# Patient Record
Sex: Male | Born: 1950 | Race: Black or African American | Hispanic: No | Marital: Married | Smoking: Former smoker
Health system: Southern US, Community
[De-identification: ages and names within clinical notes are randomized; demographics above are authoritative.]

## PROBLEM LIST (undated history)

## (undated) DIAGNOSIS — M1990 Unspecified osteoarthritis, unspecified site: Secondary | ICD-10-CM

## (undated) DIAGNOSIS — J329 Chronic sinusitis, unspecified: Secondary | ICD-10-CM

## (undated) DIAGNOSIS — R040 Epistaxis: Secondary | ICD-10-CM

## (undated) DIAGNOSIS — K219 Gastro-esophageal reflux disease without esophagitis: Secondary | ICD-10-CM

## (undated) DIAGNOSIS — N200 Calculus of kidney: Secondary | ICD-10-CM

## (undated) DIAGNOSIS — C61 Malignant neoplasm of prostate: Secondary | ICD-10-CM

## (undated) HISTORY — DX: Chronic sinusitis, unspecified: J32.9

## (undated) HISTORY — DX: Calculus of kidney: N20.0

## (undated) HISTORY — DX: Malignant neoplasm of prostate: C61

## (undated) HISTORY — DX: Epistaxis: R04.0

## (undated) HISTORY — DX: Gastro-esophageal reflux disease without esophagitis: K21.9

## (undated) HISTORY — DX: Unspecified osteoarthritis, unspecified site: M19.90

---

## 1998-01-01 ENCOUNTER — Other Ambulatory Visit: Admission: RE | Admit: 1998-01-01 | Discharge: 1998-01-01 | Payer: Self-pay | Admitting: *Deleted

## 2003-01-14 ENCOUNTER — Ambulatory Visit (HOSPITAL_COMMUNITY): Admission: RE | Admit: 2003-01-14 | Discharge: 2003-01-14 | Payer: Self-pay | Admitting: Internal Medicine

## 2003-05-07 ENCOUNTER — Encounter: Payer: Self-pay | Admitting: Gastroenterology

## 2005-06-29 ENCOUNTER — Encounter: Payer: Self-pay | Admitting: Gastroenterology

## 2005-07-02 ENCOUNTER — Encounter: Payer: Self-pay | Admitting: Gastroenterology

## 2006-04-04 ENCOUNTER — Encounter: Payer: Self-pay | Admitting: Gastroenterology

## 2006-05-24 HISTORY — PX: LITHOTRIPSY: SUR834

## 2006-06-16 ENCOUNTER — Ambulatory Visit (HOSPITAL_COMMUNITY): Admission: RE | Admit: 2006-06-16 | Discharge: 2006-06-16 | Payer: Self-pay | Admitting: Urology

## 2008-09-04 ENCOUNTER — Encounter: Admission: RE | Admit: 2008-09-04 | Discharge: 2008-09-04 | Payer: Self-pay | Admitting: Internal Medicine

## 2008-10-30 ENCOUNTER — Ambulatory Visit: Payer: Self-pay | Admitting: Gastroenterology

## 2008-10-30 DIAGNOSIS — R12 Heartburn: Secondary | ICD-10-CM | POA: Insufficient documentation

## 2008-10-30 DIAGNOSIS — Z860100 Personal history of colon polyps, unspecified: Secondary | ICD-10-CM | POA: Insufficient documentation

## 2008-12-27 ENCOUNTER — Encounter: Admission: RE | Admit: 2008-12-27 | Discharge: 2008-12-27 | Payer: Self-pay | Admitting: Family Medicine

## 2009-01-11 ENCOUNTER — Encounter: Admission: RE | Admit: 2009-01-11 | Discharge: 2009-01-11 | Payer: Self-pay | Admitting: Family Medicine

## 2009-03-18 ENCOUNTER — Ambulatory Visit: Admission: RE | Admit: 2009-03-18 | Discharge: 2009-05-23 | Payer: Self-pay | Admitting: Radiation Oncology

## 2009-05-28 ENCOUNTER — Ambulatory Visit
Admission: RE | Admit: 2009-05-28 | Discharge: 2009-07-24 | Payer: Self-pay | Source: Home / Self Care | Admitting: Radiation Oncology

## 2010-04-21 ENCOUNTER — Ambulatory Visit: Payer: Self-pay | Admitting: Family Medicine

## 2010-04-29 ENCOUNTER — Emergency Department (HOSPITAL_COMMUNITY)
Admission: EM | Admit: 2010-04-29 | Discharge: 2010-04-29 | Payer: Self-pay | Source: Home / Self Care | Admitting: Emergency Medicine

## 2010-05-04 ENCOUNTER — Encounter
Admission: RE | Admit: 2010-05-04 | Discharge: 2010-05-04 | Payer: Self-pay | Source: Home / Self Care | Attending: Otolaryngology | Admitting: Otolaryngology

## 2010-06-23 NOTE — Procedures (Signed)
Summary: Colonoscopy/[facility]  [facility]   Imported By: Sherian Rein 11/01/2008 10:40:30  _____________________________________________________________________  External Attachment:    Type:   Image     Comment:   External Document

## 2010-06-23 NOTE — Procedures (Signed)
Summary: EGD/[facility]  EGD/[facility]   Imported By: Sherian Rein 11/01/2008 10:43:40  _____________________________________________________________________  External Attachment:    Type:   Image     Comment:   External Document

## 2010-06-25 ENCOUNTER — Ambulatory Visit (INDEPENDENT_AMBULATORY_CARE_PROVIDER_SITE_OTHER): Payer: Managed Care, Other (non HMO) | Admitting: Physician Assistant

## 2010-06-25 DIAGNOSIS — J069 Acute upper respiratory infection, unspecified: Secondary | ICD-10-CM

## 2010-07-16 ENCOUNTER — Encounter (INDEPENDENT_AMBULATORY_CARE_PROVIDER_SITE_OTHER): Payer: Self-pay | Admitting: *Deleted

## 2010-07-21 ENCOUNTER — Ambulatory Visit: Payer: Managed Care, Other (non HMO) | Admitting: Gastroenterology

## 2010-07-21 NOTE — Letter (Signed)
Summary: New Patient letter  [facility]  [address] [place], [place] [address]   Phone: [phone]  Fax: [phone]       07/16/2010 MRN: [id number]  William Hicks [address] [place], [place]  [address]  Dear Mr. Heffern,  Welcome to the Gastroenterology Division at [facility].    You are scheduled to see Dr.  Arlyce Dice on 07-21-10 at 1:30P.M. on the 3rd floor at [facility], [address]  We ask that you try to arrive at our office 15 minutes prior to your appointment time to allow for check-in.  We would like you to complete the enclosed self-administered evaluation form prior to your visit and bring it with you on the day of your appointment.  We will review it with you.  Also, please bring a complete list of all your medications or, if you prefer, bring the medication bottles and we will list them.  Please bring your insurance card so that we may make a copy of it.  If your insurance requires a referral to see a specialist, please bring your referral form from your primary care physician.  Co-payments are due at the time of your visit and may be paid by cash, check or credit card.     Your office visit will consist of a consult with your physician (includes a physical exam), any laboratory testing he/she may order, scheduling of any necessary diagnostic testing (e.g. x-ray, ultrasound, CT-scan), and scheduling of a procedure (e.g. Endoscopy, Colonoscopy) if required.  Please allow enough time on your schedule to allow for any/all of these possibilities.    If you cannot keep your appointment, please call [phone] to cancel or reschedule prior to your appointment date.  This allows [place] the opportunity to schedule an appointment for another patient in need of care.  If you do not cancel or reschedule by 5 p.m. the business day prior to your appointment date, you will be charged a $50.00 late cancellation/no-show fee.    Thank you for choosing  [facility] for your medical needs.  We appreciate the opportunity to care for you.  Please visit [place] at our website  to learn more about our practice.                     Sincerely,                                                             The Gastroenterology Division

## 2010-08-04 LAB — CBC
HCT: 42.8 % (ref 39.0–52.0)
Hemoglobin: 14.9 g/dL (ref 13.0–17.0)
MCH: 26 pg (ref 26.0–34.0)
MCHC: 34.8 g/dL (ref 30.0–36.0)
MCV: 74.7 fL — ABNORMAL LOW (ref 78.0–100.0)
Platelets: 138 10*3/uL — ABNORMAL LOW (ref 150–400)
RBC: 5.73 MIL/uL (ref 4.22–5.81)
RDW: 13.2 % (ref 11.5–15.5)
WBC: 7 10*3/uL (ref 4.0–10.5)

## 2010-08-04 LAB — DIFFERENTIAL
Basophils Absolute: 0 10*3/uL (ref 0.0–0.1)
Basophils Relative: 0 % (ref 0–1)
Eosinophils Absolute: 0.1 10*3/uL (ref 0.0–0.7)
Eosinophils Relative: 2 % (ref 0–5)
Lymphocytes Relative: 12 % (ref 12–46)
Lymphs Abs: 0.8 10*3/uL (ref 0.7–4.0)
Monocytes Absolute: 0.7 10*3/uL (ref 0.1–1.0)
Monocytes Relative: 10 % (ref 3–12)
Neutro Abs: 5.4 10*3/uL (ref 1.7–7.7)
Neutrophils Relative %: 76 % (ref 43–77)

## 2010-08-04 LAB — BASIC METABOLIC PANEL
BUN: 14 mg/dL (ref 6–23)
CO2: 32 mEq/L (ref 19–32)
Calcium: 8.9 mg/dL (ref 8.4–10.5)
Chloride: 101 mEq/L (ref 96–112)
Creatinine, Ser: 1.02 mg/dL (ref 0.4–1.5)
GFR calc Af Amer: >60 mL/min (ref >60)
GFR calc non Af Amer: >60 mL/min (ref >60)
Glucose, Bld: 88 mg/dL (ref 70–99)
Potassium: 3.5 mEq/L (ref 3.5–5.1)
Sodium: 139 mEq/L (ref 135–145)

## 2010-08-13 ENCOUNTER — Ambulatory Visit (INDEPENDENT_AMBULATORY_CARE_PROVIDER_SITE_OTHER): Payer: Managed Care, Other (non HMO) | Admitting: Family Medicine

## 2010-08-13 DIAGNOSIS — J029 Acute pharyngitis, unspecified: Secondary | ICD-10-CM

## 2010-08-19 ENCOUNTER — Encounter (INDEPENDENT_AMBULATORY_CARE_PROVIDER_SITE_OTHER): Payer: Managed Care, Other (non HMO) | Admitting: Family Medicine

## 2010-08-19 DIAGNOSIS — Z79899 Other long term (current) drug therapy: Secondary | ICD-10-CM

## 2010-08-19 DIAGNOSIS — Z0000 Encounter for general adult medical examination without abnormal findings: Secondary | ICD-10-CM

## 2010-08-19 DIAGNOSIS — F528 Other sexual dysfunction not due to a substance or known physiological condition: Secondary | ICD-10-CM

## 2010-08-19 DIAGNOSIS — K219 Gastro-esophageal reflux disease without esophagitis: Secondary | ICD-10-CM

## 2010-12-18 ENCOUNTER — Encounter (INDEPENDENT_AMBULATORY_CARE_PROVIDER_SITE_OTHER): Payer: Self-pay | Admitting: General Surgery

## 2010-12-18 ENCOUNTER — Other Ambulatory Visit (INDEPENDENT_AMBULATORY_CARE_PROVIDER_SITE_OTHER): Payer: Self-pay | Admitting: General Surgery

## 2010-12-18 ENCOUNTER — Ambulatory Visit (INDEPENDENT_AMBULATORY_CARE_PROVIDER_SITE_OTHER): Payer: Private Health Insurance - Indemnity | Admitting: General Surgery

## 2010-12-18 DIAGNOSIS — L03317 Cellulitis of buttock: Secondary | ICD-10-CM

## 2010-12-18 DIAGNOSIS — K611 Rectal abscess: Secondary | ICD-10-CM

## 2010-12-18 DIAGNOSIS — K612 Anorectal abscess: Secondary | ICD-10-CM

## 2010-12-18 DIAGNOSIS — L0231 Cutaneous abscess of buttock: Secondary | ICD-10-CM

## 2010-12-18 MED ORDER — DOXYCYCLINE HYCLATE 100 MG PO TABS: 100.0000 mg | ORAL_TABLET | Freq: Two times a day (BID) | ORAL | Status: AC

## 2010-12-18 MED ORDER — TRAMADOL HCL 50 MG PO TABS
50.0000 mg | ORAL_TABLET | Freq: Four times a day (QID) | ORAL | Status: AC | PRN
Start: 2010-12-18 — End: 2011-12-18

## 2010-12-18 NOTE — Progress Notes (Signed)
William Hicks is a 60 y.o. male.    Chief Complaint  Patient presents with  . Other    evaluate peri rectal abscess    HPI HPI The patient initially had a problem with what was called a perirectal abscess about 2 weeks ago. He was initially treated with p.o. antibiotics Augmentin with some relief he stopped his antibiotics unless there is starting this morning he developed more tenderness and pain in the same area where he has had tenderness before he also developed on the left side a similar abscess near the superior portion of the intergluteal fold.  The patient was asked to come and see surgery after this recurrence of the "perirectal abscess" in  Past Medical History  Diagnosis Date  . Asthma   . GERD (gastroesophageal reflux disease)   . Sinusitis   . Epistaxis   . Arthritis   . Prostate cancer   . Kidney stones     Past Surgical History  Procedure Date  . Lithotripsy 2008    Family History  Problem Relation Age of Onset  . Alzheimer's disease Father     Social History History  Substance Use Topics  . Smoking status: Former Games developer  . Smokeless tobacco: Not on file  . Alcohol Use: Yes    No Known Allergies  Current Outpatient Prescriptions  Medication Sig Dispense Refill  . fluticasone (VERAMYST) 27.5 MCG/SPRAY nasal spray Place 2 sprays into the nose daily.        . Loratadine (CLARITIN PO) Take by mouth as needed.        Marland Kitchen LORAZEPAM PO Take by mouth daily.        Marland Kitchen amoxicillin-clavulanate (AUGMENTIN) 875-125 MG per tablet       . DEXILANT 60 MG capsule         Review of Systems Review of Systems  Constitutional: Negative for fever and chills.    Physical Exam Physical Exam  Genitourinary: Rectum normal. Rectal exam shows no internal hemorrhoid and no mass.        There were no vitals taken for this visit.  Assessment/Plan Technically this is not a perirectal abscess but aphakic folliculitis and/or a boil of the gluteus maximus. He had  a similar lesion on the left upper intergluteal fold that has resolved without incision and drainage.  The plan today would be to anesthetize patient and drainage under local anesthetic.  With a Betadine prep of the anesthetize the area to make a cruciate incision directly over the boil on the right gluteal cheek. It was minimal purulent drainage cultures were sent. I was able to pedicle with about a foot and a half a quarter-inch iodoform gauze which were removed menisci the patient on Tuesday.  Ranette Luckadoo III,Sabrea Sankey O 12/18/2010, 4:19 PM

## 2010-12-22 ENCOUNTER — Encounter (INDEPENDENT_AMBULATORY_CARE_PROVIDER_SITE_OTHER): Payer: Self-pay | Admitting: General Surgery

## 2010-12-22 ENCOUNTER — Ambulatory Visit (INDEPENDENT_AMBULATORY_CARE_PROVIDER_SITE_OTHER): Payer: Private Health Insurance - Indemnity | Admitting: General Surgery

## 2010-12-22 DIAGNOSIS — L03317 Cellulitis of buttock: Secondary | ICD-10-CM

## 2010-12-22 DIAGNOSIS — L0231 Cutaneous abscess of buttock: Secondary | ICD-10-CM

## 2010-12-22 LAB — WOUND CULTURE
Gram Stain: NONE SEEN
Gram Stain: NONE SEEN

## 2010-12-22 NOTE — Progress Notes (Signed)
HPI The patient is status post incision and drainage of right gluteal abscess. Cultures have demonstrated Staphylococcus aureus. The patient was placed on doxycycline at the last office visit.  PE The patient continues to have a significant amount of induration around the opening of the drained abscess on his right gluteal cheek. The packing was removed in the office today. There appears to be an empty cavity with no pus the indurated area is tender but there is no redness there is no drainage.  Studiy review There are no studies today. However, I am considering getting some type of imaging study if the induration does not resolve over time or if his symptoms worsen.  Assessment Resolving gluteal abscess.  Plan Continue his antibiotics until they are finished. Start sitz baths at least twice a day to go see him back in 2 weeks.

## 2011-01-05 ENCOUNTER — Encounter (INDEPENDENT_AMBULATORY_CARE_PROVIDER_SITE_OTHER): Payer: Private Health Insurance - Indemnity | Admitting: General Surgery

## 2011-08-17 ENCOUNTER — Ambulatory Visit (INDEPENDENT_AMBULATORY_CARE_PROVIDER_SITE_OTHER): Payer: Managed Care, Other (non HMO) | Admitting: Family Medicine

## 2011-08-17 ENCOUNTER — Encounter: Payer: Self-pay | Admitting: Family Medicine

## 2011-08-17 VITALS — BP 130/90 | HR 104 | Ht 73.0 in | Wt 214.0 lb

## 2011-08-17 DIAGNOSIS — K219 Gastro-esophageal reflux disease without esophagitis: Secondary | ICD-10-CM | POA: Insufficient documentation

## 2011-08-17 NOTE — Progress Notes (Signed)
  Subjective:    Patient ID: William Hicks, male    DOB: 1950-09-29, 60 y.o.   MRN: [id number]  HPI He is here for consultation concerning a ten-day history of mid chest burning sensation that is worse when he lies down. He cannot relate this to food of any kind. He's had no nausea, vomiting, eructation, change in stool. Presently he is on a Dexilant and Levsin for treatment of underlying GERD and spasm. Chest pain, shortness of breath, diaphoresis   Review of Systems     Objective:   Physical Exam Alert and in no distress. Cardiac exam shows regular rhythm without murmurs or gallops. Lungs are clear to auscultation. Abdominal exam shows no masses or tenderness with normal bowel sounds       Assessment & Plan:  GERD. Refer back to GI. It seems as if he is having a breakthrough of reflux symptoms in spite of Dexilant.

## 2011-08-18 ENCOUNTER — Other Ambulatory Visit: Payer: Self-pay | Admitting: Urology

## 2011-08-18 DIAGNOSIS — C61 Malignant neoplasm of prostate: Secondary | ICD-10-CM

## 2011-08-31 ENCOUNTER — Other Ambulatory Visit (HOSPITAL_COMMUNITY): Payer: Managed Care, Other (non HMO)

## 2011-09-08 ENCOUNTER — Other Ambulatory Visit: Payer: Self-pay | Admitting: Urology

## 2011-09-08 DIAGNOSIS — C61 Malignant neoplasm of prostate: Secondary | ICD-10-CM

## 2011-09-14 ENCOUNTER — Other Ambulatory Visit (HOSPITAL_COMMUNITY): Payer: Managed Care, Other (non HMO)

## 2011-09-20 ENCOUNTER — Other Ambulatory Visit (HOSPITAL_COMMUNITY): Payer: Managed Care, Other (non HMO)

## 2011-09-24 ENCOUNTER — Encounter (HOSPITAL_COMMUNITY)
Admission: RE | Admit: 2011-09-24 | Discharge: 2011-09-24 | Disposition: A | Discharge status: Home / Self Care | Payer: Managed Care, Other (non HMO) | Source: Ambulatory Visit | Attending: Urology | Admitting: Urology

## 2011-09-24 DIAGNOSIS — N281 Cyst of kidney, acquired: Secondary | ICD-10-CM | POA: Insufficient documentation

## 2011-09-24 DIAGNOSIS — C61 Malignant neoplasm of prostate: Secondary | ICD-10-CM | POA: Insufficient documentation

## 2011-09-24 MED ORDER — FLUDEOXYGLUCOSE F - 18 (FDG) INJECTION
18.5000 | Freq: Once | INTRAVENOUS | Status: AC | PRN
  Administered 2011-09-24: 18.5 via INTRAVENOUS

## 2011-10-08 ENCOUNTER — Other Ambulatory Visit: Payer: Self-pay | Admitting: Pediatrics

## 2011-10-08 ENCOUNTER — Ambulatory Visit
Admission: RE | Admit: 2011-10-08 | Discharge: 2011-10-08 | Disposition: A | Discharge status: Home / Self Care | Payer: 59 | Source: Ambulatory Visit | Attending: Pediatrics | Admitting: Pediatrics

## 2011-10-08 DIAGNOSIS — R05 Cough: Secondary | ICD-10-CM

## 2011-10-08 DIAGNOSIS — R059 Cough, unspecified: Secondary | ICD-10-CM

## 2011-10-12 ENCOUNTER — Other Ambulatory Visit: Payer: Self-pay | Admitting: Urology

## 2011-10-12 DIAGNOSIS — C61 Malignant neoplasm of prostate: Secondary | ICD-10-CM

## 2011-11-01 ENCOUNTER — Ambulatory Visit
Admission: RE | Admit: 2011-11-01 | Discharge: 2011-11-01 | Disposition: A | Discharge status: Home / Self Care | Payer: 59 | Source: Ambulatory Visit | Attending: Urology | Admitting: Urology

## 2011-11-01 DIAGNOSIS — C61 Malignant neoplasm of prostate: Secondary | ICD-10-CM

## 2011-11-12 ENCOUNTER — Other Ambulatory Visit: Payer: 59

## 2011-11-23 ENCOUNTER — Institutional Professional Consult (permissible substitution): Payer: 59 | Admitting: Pulmonary Disease

## 2011-12-02 ENCOUNTER — Other Ambulatory Visit: Payer: Self-pay | Admitting: Family Medicine

## 2011-12-03 NOTE — Telephone Encounter (Signed)
Is this ok?

## 2011-12-03 NOTE — Telephone Encounter (Signed)
Levitra refilled.

## 2011-12-10 ENCOUNTER — Encounter: Payer: Self-pay | Admitting: Urology

## 2012-01-10 ENCOUNTER — Other Ambulatory Visit: Payer: Self-pay | Admitting: Family Medicine

## 2012-01-10 NOTE — Telephone Encounter (Signed)
Is this ok?

## 2012-01-19 ENCOUNTER — Institutional Professional Consult (permissible substitution): Payer: 59 | Admitting: Pulmonary Disease

## 2012-04-18 ENCOUNTER — Other Ambulatory Visit: Payer: Self-pay

## 2012-09-25 DIAGNOSIS — E785 Hyperlipidemia, unspecified: Secondary | ICD-10-CM | POA: Insufficient documentation

## 2013-11-14 DIAGNOSIS — M169 Osteoarthritis of hip, unspecified: Secondary | ICD-10-CM | POA: Insufficient documentation

## 2015-05-06 ENCOUNTER — Encounter: Payer: Self-pay | Admitting: Physician Assistant

## 2015-05-06 ENCOUNTER — Ambulatory Visit: Payer: Self-pay | Admitting: Physician Assistant

## 2015-05-06 VITALS — BP 130/80 | HR 83 | Temp 97.9°F

## 2015-05-06 DIAGNOSIS — J069 Acute upper respiratory infection, unspecified: Secondary | ICD-10-CM

## 2015-05-06 DIAGNOSIS — C61 Malignant neoplasm of prostate: Secondary | ICD-10-CM | POA: Insufficient documentation

## 2015-05-06 MED ORDER — CEFDINIR 300 MG PO CAPS: 300.0000 mg | ORAL_CAPSULE | Freq: Two times a day (BID) | ORAL | Status: DC

## 2015-05-06 NOTE — Progress Notes (Signed)
S: C/o runny nose and congestion for 3 days, no fever, chills, cp/sob, v/d; mucus is green and thick, cough is sporadic, c/o of facial and dental pain.   Using otc meds:   O: PE: vitals wnl perrl eomi, normocephalic, tms dull, nasal mucosa red and swollen, throat injected, neck supple no lymph, lungs c t a, cv rrr, neuro intact  A:  Acute sinusitis   P: omnicef, drink fluids, continue regular meds , use otc meds of choice, return if not improving in 5 days, return earlier if worsening

## 2015-05-09 ENCOUNTER — Telehealth: Payer: Self-pay | Admitting: Physician Assistant

## 2015-05-09 MED ORDER — CEPHALEXIN 500 MG PO CAPS: 500.0000 mg | ORAL_CAPSULE | Freq: Three times a day (TID) | ORAL | Status: DC

## 2015-05-09 NOTE — Telephone Encounter (Signed)
William Hicks, can you call in keflex 500mg  tid for 10days, #30 nr

## 2015-05-09 NOTE — Telephone Encounter (Signed)
Spoke with [employer] @ [facility] in [place] called in [place]. Attempted to call patient no ans. Unable to leave message.

## 2015-07-18 ENCOUNTER — Ambulatory Visit: Payer: Self-pay | Admitting: Physician Assistant

## 2015-07-18 ENCOUNTER — Encounter: Payer: Self-pay | Admitting: Physician Assistant

## 2015-07-18 VITALS — BP 130/80 | HR 84 | Temp 98.5°F

## 2015-07-18 DIAGNOSIS — J069 Acute upper respiratory infection, unspecified: Secondary | ICD-10-CM

## 2015-07-18 MED ORDER — PSEUDOEPH-BROMPHEN-DM 30-2-10 MG/5ML PO SYRP: 5.0000 mL | ORAL_SOLUTION | Freq: Four times a day (QID) | ORAL | Status: DC | PRN

## 2015-07-18 MED ORDER — FEXOFENADINE-PSEUDOEPHED ER 60-120 MG PO TB12: 1.0000 | ORAL_TABLET | Freq: Two times a day (BID) | ORAL | Status: DC

## 2015-07-18 NOTE — Progress Notes (Signed)
   Subjective:Nasal/chest congestion    Patient ID: William Hicks, male    DOB: 1950/10/30, 65 y.o.   MRN: [id number]  HPI Patient complain of nasal and chest congestion for 5 days. Patient has history of allergic rhinitis and uses Flonase. States thick yellowish productive cough.  Denies fever/chill, or nausea, vomiting., or diarrhea. No other palliative measures for this compliant.    Review of Systems    GRED and Erectile dysfunction Objective:   Physical Exam Bilateral maxillary guarding with edematous nasal turbinates. Post nasal drainage and productive cough.  Neck supple without adenopathy.  Lungs CTA and Heart RRR.       Assessment & Plan:URI  Allergran- D and Bromfed DM as directed.  Follow up PRN.

## 2015-07-22 ENCOUNTER — Telehealth: Payer: Self-pay | Admitting: Family

## 2015-07-25 ENCOUNTER — Ambulatory Visit: Payer: Self-pay | Admitting: Physician Assistant

## 2015-10-31 NOTE — Telephone Encounter (Signed)
Per Colleen Can continue Allegra D.

## 2016-10-25 ENCOUNTER — Ambulatory Visit: Payer: Self-pay | Admitting: Physician Assistant

## 2016-10-25 ENCOUNTER — Ambulatory Visit
Admission: RE | Admit: 2016-10-25 | Discharge: 2016-10-25 | Disposition: A | Discharge status: Home / Self Care | Payer: Self-pay | Source: Ambulatory Visit | Attending: Physician Assistant | Admitting: Physician Assistant

## 2016-10-25 ENCOUNTER — Encounter: Payer: Self-pay | Admitting: Physician Assistant

## 2016-10-25 VITALS — BP 130/88 | HR 67 | Temp 98.4°F

## 2016-10-25 DIAGNOSIS — I879 Disorder of vein, unspecified: Secondary | ICD-10-CM | POA: Insufficient documentation

## 2016-10-25 NOTE — Progress Notes (Signed)
S: c/o bulging long "knot" on r side of chest/abdomen, no pain in abdomen, no cp/sob, noticed the area on Saturday and it has gotten a little worse, no fever/chills/injury  O: vitals wnl, nad, skin intact no bruising or increased warmth noted, + long distended hard flat vein from area near nipple to below r rib, lungs c t a, cv rrr  A: vein d/o  P: [place] chest to evaluate vein for clot

## 2016-10-27 NOTE — Progress Notes (Signed)
Contacted patient to get an update on him. Inquired if he had reached out to a [facility] provider since he has [employer] ins. If we can be  Any assistant

## 2016-12-28 ENCOUNTER — Encounter: Payer: Self-pay | Admitting: Allergy and Immunology

## 2016-12-28 ENCOUNTER — Ambulatory Visit (INDEPENDENT_AMBULATORY_CARE_PROVIDER_SITE_OTHER): Payer: Non-veteran care | Admitting: Allergy and Immunology

## 2016-12-28 VITALS — BP 126/70 | HR 92 | Resp 16 | Ht 72.0 in | Wt 218.8 lb

## 2016-12-28 DIAGNOSIS — J3089 Other allergic rhinitis: Secondary | ICD-10-CM | POA: Diagnosis not present

## 2016-12-28 DIAGNOSIS — J4520 Mild intermittent asthma, uncomplicated: Secondary | ICD-10-CM

## 2016-12-28 DIAGNOSIS — J324 Chronic pansinusitis: Secondary | ICD-10-CM | POA: Diagnosis not present

## 2016-12-28 MED ORDER — METHYLPREDNISOLONE ACETATE 80 MG/ML IJ SUSP
80.0000 mg | Freq: Once | INTRAMUSCULAR | Status: AC
  Administered 2016-12-28: 80 mg via INTRAMUSCULAR

## 2016-12-28 MED ORDER — MONTELUKAST SODIUM 10 MG PO TABS: 10.0000 mg | ORAL_TABLET | Freq: Every day | ORAL | 2 refills | Status: DC

## 2016-12-28 NOTE — Patient Instructions (Addendum)
  1.  Allergen avoidance measures  2. Treat and prevent inflammation:   A. OTC Nasacort - one spray each nostril one time per day  B. montelukast 10 mg - one tablet one time per day  C. Depo-Medrol 80 IM delivered in clinic today  3. If needed:   A. cetirizine 10 mg tablet 1 time per day  B. nasal saline multiple times a day  C. ProAir HFA 2 puffs every 4-6 hours  4. Review results of chest x-ray and sinus CT scan from [place]  5. Consider a course of immunotherapy  6. Blood - CBC w/diff, IgA/G/M  7. Return to clinic in 4 weeks or earlier if problem

## 2016-12-28 NOTE — Progress Notes (Signed)
Dear Dr. Damita Dunnings,  Thank you for referring William Hicks to the [facility] on 12/28/2016.   Below is a summation of this patient's evaluation and recommendations.  Thank you for your referral. I will keep you informed about this patient's response to treatment.   If you have any questions please do not hesitate to contact me.   Sincerely,  Jiles Prows, MD Allergy / Immunology [place]   ______________________________________________________________________    NEW PATIENT NOTE  Referring Provider: Theodoro Clock, MD Primary Provider: Linus Mako, NP Date of office visit: 12/28/2016    Subjective:   Chief Complaint:  William Hicks (DOB: 17-Aug-1950) is a 66 y.o. male who presents to the clinic on 12/28/2016 with a chief complaint of New Patient (Initial Visit) .     HPI: William Hicks presents to this clinic in evaluation of 2 main issues.  First, he has a long history of nasal congestion and sneezing that may be precipitated by grass exposure but basically occurs on a perennial basis. He has intermittent episodes of "sinusitis" and has been treated with antibiotics and systemic steroids with a frequency of approximately 3 times per year. He was given Flonase for about 2 years which did not really help him. He was seen by an ENT doctor who performed a CT scan of the sinuses which was "abnormal". Presently over the course of the past 2 months he has had some intermittent ugly nasal discharge and complete anosmia.  Second, he has a history of asthma. This apparently flares when his head is congested and has also been present all life. His requirement for short acting bronchodilator is about 1 time every 2 weeks. He will use a short acting bronchodilator prior to mowing the grass. He been given a controller inhaler but he does not use his inhaler. He also believes that he  receives systemic steroids about 3 times per year for this issue, but usually this is in a situation when his upper airway disease is flaring as well.  The results of his sinus CT scan is not available for review during today's evaluation. He apparently had a chest x-ray in 2017 which also is not available for review during today's evaluation. Apparently his chest x-ray was normal.  He did smoke tobacco but only for approximately 4 - 5 years and discontinued this hobby at the age of 49.  He does have reflux disease which is under excellent control on his current dose of omeprazole. He has been given ranitidine to use as well but does not require this medication.  Past Medical History:  Diagnosis Date  . Arthritis   . Asthma   . Epistaxis   . GERD (gastroesophageal reflux disease)   . Kidney stones   . Prostate cancer ([place])   . Sinusitis     Past Surgical History:  Procedure Laterality Date  . LITHOTRIPSY  2008    Allergies as of 12/28/2016      Reactions   Aspirin Diarrhea, Nausea And Vomiting      Medication List      fluticasone 50 MCG/ACT nasal spray Commonly known as:  FLONASE Place into the nose.   LEVITRA 20 MG tablet Generic drug:  vardenafil TAKE ONE TABLET BY MOUTH DAILY AS NEEDED   omeprazole 40 MG capsule Commonly known as:  PRILOSEC Take 40 mg by mouth daily.   PROAIR HFA 108 (90  Base) MCG/ACT inhaler Generic drug:  albuterol Inhale into the lungs.       Review of systems negative except as noted in HPI / PMHx or noted below:  Review of Systems  Constitutional: Negative.   HENT: Negative.   Eyes: Negative.   Respiratory: Negative.   Cardiovascular: Negative.   Gastrointestinal: Negative.   Genitourinary: Negative.   Musculoskeletal: Negative.   Skin: Negative.   Neurological: Negative.   Endo/Heme/Allergies: Negative.   Psychiatric/Behavioral: Negative.     Family History  Problem Relation Age of Onset  . Alzheimer's disease Father      Social History   Social History  . Marital status: Married    Spouse name: N/A  . Number of children: N/A  . Years of education: N/A   Occupational History  . Not on file.   Social History Main Topics  . Smoking status: Former Research scientist (life sciences)  . Smokeless tobacco: Never Used  . Alcohol use Yes  . Drug use: No  . Sexual activity: Not on file   Other Topics Concern  . Not on file   Social History Narrative  . No narrative on file    Environmental and Social history  Lives in a house with a dry environment, no animals located inside the household, no carpeting in the bedroom, no plastic on the bed, plastic on the pillow, and no smoker located inside the household. He works as a med Building services engineer:   Vitals:   12/28/16 1828  BP: 126/70  Pulse: 92  Resp: 16   Height: 6' (182.9 cm) Weight: 218 lb 12.8 oz (99.2 kg)  Physical Exam  Constitutional: He is well-developed, well-nourished, and in no distress.  Nasal voice, nasal crease  HENT:  Head: Normocephalic. Head is without right periorbital erythema and without left periorbital erythema.  Right Ear: Tympanic membrane, external ear and ear canal normal.  Left Ear: Tympanic membrane, external ear and ear canal normal.  Nose: Mucosal edema present. No rhinorrhea.  Mouth/Throat: Oropharynx is clear and moist and mucous membranes are normal. No oropharyngeal exudate.  Eyes: Pupils are equal, round, and reactive to light. Conjunctivae and lids are normal.  Neck: Trachea normal. No tracheal deviation present. No thyromegaly present.  Cardiovascular: Normal rate, regular rhythm, S1 normal, S2 normal and normal heart sounds.   No murmur heard. Pulmonary/Chest: Effort normal. No stridor. No tachypnea. No respiratory distress. He has no wheezes. He has no rales. He exhibits no tenderness.  Abdominal: Soft. He exhibits no distension and no mass. There is no hepatosplenomegaly. There is no tenderness. There is no rebound and no  guarding.  Musculoskeletal: He exhibits no edema or tenderness.  Lymphadenopathy:       Head (right side): No tonsillar adenopathy present.       Head (left side): No tonsillar adenopathy present.    He has no cervical adenopathy.    He has no axillary adenopathy.  Neurological: He is alert. Gait normal.  Skin: No rash noted. He is not diaphoretic. No erythema. No pallor. Nails show no clubbing.  Psychiatric: Mood and affect normal.    Diagnostics: Allergy skin tests were performed. He demonstrated hypersensitivity to house dust mite, cat, grass, and ragweed.  Spirometry was performed and demonstrated an FEV1 of 3.19 @ 98 % of predicted.  Results of a chest CT scan obtained 11/01/2011 identify the following:  Pre tracheal lymph node measures 0.9 cm, image 15. This is unchanged from prior exam.    Persistent ground-glass lesion  within the superior segment of the left lower lobe measuring 3.2 cm.  Previously this measured 2.2 cm.  No new pulmonary densities identified.  Results of a head CT scan obtained 04/29/2010 identify the following:  1.  Chronic ethmoid and sphenoid sinusitis.   Otherwise, no significant abnormality identified.   Assessment and Plan:    1. Other allergic rhinitis   2. Chronic pansinusitis   3. Asthma, mild intermittent, well-controlled     1.  Allergen avoidance measures  2. Treat and prevent inflammation:   A. OTC Nasacort - one spray each nostril one time per day  B. montelukast 10 mg - one tablet one time per day  C. Depo-Medrol 80 IM delivered in clinic today  3. If needed:   A. cetirizine 10 mg tablet 1 time per day  B. nasal saline multiple times a day  C. ProAir HFA 2 puffs every 4-6 hours  4. Review results of chest x-ray and sinus CT scan from [place]  5. Consider a course of immunotherapy  6. Blood - CBC w/diff, IgA/G/M  7. Return to clinic in 4 weeks or earlier if problem  Jamill appears to have atopic respiratory disease and we  will get him to perform allergen avoidance measures and consistently using anti-inflammatory medications as noted above. I have given him literature on immunotherapy during today's visit and he is presently considering this option. He would be a candidate for immunotherapy given the fact that he has failed medical therapy in the past. He does have an abnormal CT scan finding back in 2013 and this needs to be followed up and apparently he did have a chest x-ray recently which was normal. As well, I would like to review the results of his sinus CT scan that was performed at the [place] and we will obtain the results of that study for review. Finally, I would like to see if he has eosinophilic driven disease and if his antibody levels are adequate in preventing him from developing recurrent infections. I will regroup with him in 4 weeks or earlier if there is a problem.  Jiles Prows, MD Allergy / Immunology [facility]

## 2016-12-29 LAB — CBC WITH DIFFERENTIAL/PLATELET
Basophils Absolute: 0 10*3/uL (ref 0.0–0.2)
Basos: 0 %
EOS (ABSOLUTE): 0.2 10*3/uL (ref 0.0–0.4)
Eos: 4 %
Hematocrit: 39.3 % (ref 37.5–51.0)
Hemoglobin: 12.9 g/dL — ABNORMAL LOW (ref 13.0–17.7)
Immature Grans (Abs): 0 10*3/uL (ref 0.0–0.1)
Immature Granulocytes: 0 %
Lymphocytes Absolute: 0.9 10*3/uL (ref 0.7–3.1)
Lymphs: 16 %
MCH: 24.4 pg — ABNORMAL LOW (ref 26.6–33.0)
MCHC: 32.8 g/dL (ref 31.5–35.7)
MCV: 74 fL — ABNORMAL LOW (ref 79–97)
Monocytes Absolute: 0.4 10*3/uL (ref 0.1–0.9)
Monocytes: 7 %
Neutrophils Absolute: 4 10*3/uL (ref 1.4–7.0)
Neutrophils: 73 %
Platelets: 171 10*3/uL (ref 150–379)
RBC: 5.28 x10E6/uL (ref 4.14–5.80)
RDW: 14.4 % (ref 12.3–15.4)
WBC: 5.5 10*3/uL (ref 3.4–10.8)

## 2016-12-29 LAB — IGG, IGA, IGM
IgA/Immunoglobulin A, Serum: 17 mg/dL — ABNORMAL LOW (ref 61–437)
IgG (Immunoglobin G), Serum: 1116 mg/dL (ref 700–1600)
IgM (Immunoglobulin M), Srm: 23 mg/dL (ref 20–172)

## 2016-12-30 NOTE — Addendum Note (Signed)
Addended by: Clydene Laming, AMBER L on: 12/30/2016 02:21 PM   Modules accepted: Orders

## 2017-01-25 ENCOUNTER — Encounter: Payer: Self-pay | Admitting: Allergy and Immunology

## 2017-01-25 ENCOUNTER — Ambulatory Visit (INDEPENDENT_AMBULATORY_CARE_PROVIDER_SITE_OTHER): Payer: Non-veteran care | Admitting: Allergy and Immunology

## 2017-01-25 VITALS — BP 120/88 | HR 64 | Resp 22

## 2017-01-25 DIAGNOSIS — J4520 Mild intermittent asthma, uncomplicated: Secondary | ICD-10-CM | POA: Diagnosis not present

## 2017-01-25 DIAGNOSIS — J324 Chronic pansinusitis: Secondary | ICD-10-CM

## 2017-01-25 DIAGNOSIS — J3089 Other allergic rhinitis: Secondary | ICD-10-CM | POA: Diagnosis not present

## 2017-01-25 NOTE — Progress Notes (Signed)
Follow-up Note  Referring Provider: Linus Mako, NP Primary Provider: Linus Mako, NP Date of Office Visit: 01/25/2017  Subjective:   William Hicks (DOB: 1950-09-16) is a 66 y.o. male who returns to the [facility] [place] on 01/25/2017 in re-evaluation of the following:  HPI: Nakhi returns to this clinic in reevaluation of his persistent upper airway symptoms addressed during his initial evaluation of 12/28/2016.  He is better. He has had a decrease in his nasal congestion and postnasal drip and his sneezing but he still has the symptoms active in the face of his therapy. He had return of his ability to smell after receiving a systemic steroid injection during last visit but that has slowly returned. He did performed house dust avoidance measures and is using a nasal steroid and a leukotriene modifier. He has had no need to use any ProAir HFA as he has had no wheezing or coughing or shortness of breath or exercise intolerance.  Allergies as of 01/25/2017      Reactions   Aspirin Diarrhea, Nausea And Vomiting      Medication List      fluticasone 50 MCG/ACT nasal spray Commonly known as:  FLONASE Place into the nose.   montelukast 10 MG tablet Commonly known as:  SINGULAIR Take 1 tablet (10 mg total) by mouth at bedtime.   omeprazole 40 MG capsule Commonly known as:  PRILOSEC Take 40 mg by mouth daily.   PROAIR HFA 108 (90 Base) MCG/ACT inhaler Generic drug:  albuterol Inhale into the lungs.       Past Medical History:  Diagnosis Date  . Arthritis   . Asthma   . Epistaxis   . GERD (gastroesophageal reflux disease)   . Kidney stones   . Prostate cancer ([place])   . Sinusitis     Past Surgical History:  Procedure Laterality Date  . LITHOTRIPSY  2008    Review of systems negative except as noted in HPI / PMHx or noted below:  Review of Systems  Constitutional: Negative.   HENT: Negative.   Eyes: Negative.   Respiratory:  Negative.   Cardiovascular: Negative.   Gastrointestinal: Negative.   Genitourinary: Negative.   Musculoskeletal: Negative.   Skin: Negative.   Neurological: Negative.   Endo/Heme/Allergies: Negative.   Psychiatric/Behavioral: Negative.      Objective:   Vitals:   01/25/17 1613  BP: 120/88  Pulse: 64  Resp: (!) 22          Physical Exam  Constitutional: He is well-developed, well-nourished, and in no distress.  HENT:  Head: Normocephalic.  Right Ear: Tympanic membrane, external ear and ear canal normal.  Left Ear: Tympanic membrane, external ear and ear canal normal.  Nose: Nose normal. No mucosal edema or rhinorrhea.  Mouth/Throat: Uvula is midline, oropharynx is clear and moist and mucous membranes are normal. No oropharyngeal exudate.  Eyes: Conjunctivae are normal.  Neck: Trachea normal. No tracheal tenderness present. No tracheal deviation present. No thyromegaly present.  Cardiovascular: Normal rate, regular rhythm, S1 normal, S2 normal and normal heart sounds.   No murmur heard. Pulmonary/Chest: Breath sounds normal. No stridor. No respiratory distress. He has no wheezes. He has no rales.  Musculoskeletal: He exhibits no edema.  Lymphadenopathy:       Head (right side): No tonsillar adenopathy present.       Head (left side): No tonsillar adenopathy present.    He has no cervical adenopathy.  Neurological: He is alert.  Gait normal.  Skin: No rash noted. He is not diaphoretic. No erythema. Nails show no clubbing.  Psychiatric: Mood and affect normal.    Diagnostics: Results of blood tests obtained 12/28/2016 identified IgG 1116, IgA 17, IgM 23 MG/DL. White blood cell count 5.5 with 200 eosinophils, 900 lymphocytes, hemoglobin 12.9, platelet 171.  Assessment and Plan:   1. Other allergic rhinitis   2. Chronic pansinusitis   3. Asthma, mild intermittent, well-controlled     1.  Continue to perform Allergen avoidance measures  2. Continue to Treat and  prevent inflammation:   A. OTC Nasacort - one spray each nostril one time per day  B. montelukast 10 mg - one tablet one time per day  3. If needed:   A. cetirizine 10 mg tablet 1 time per day  B. nasal saline multiple times a day  C. ProAir HFA 2 puffs every 4-6 hours  4. Start a course of immunotherapy  6. Obtain fall flu vaccine   7. Return to clinic in December 2018 or earlier if problem  I think that Linley has received maximal improvement with medical therapy and this is all he is going to get with a combination of allergen avoidance measures and the consistent use of anti-inflammatory medications and he is definitely a candidate for immunotherapy and it does sound as though he is going to initiate this form of treatment sometime in the near future. I will see him back in this clinic in December 2018 or earlier if there is a problem.  Allena Katz, MD Allergy / Immunology [place]

## 2017-01-25 NOTE — Patient Instructions (Addendum)
  1.  Continue to perform Allergen avoidance measures  2. Continue to Treat and prevent inflammation:   A. OTC Nasacort - one spray each nostril one time per day  B. montelukast 10 mg - one tablet one time per day  3. If needed:   A. cetirizine 10 mg tablet 1 time per day  B. nasal saline multiple times a day  C. ProAir HFA 2 puffs every 4-6 hours  4. Start a course of immunotherapy  6. Obtain fall flu vaccine   7. Return to clinic in December 2018 or earlier if problem

## 2017-02-10 DIAGNOSIS — J3089 Other allergic rhinitis: Secondary | ICD-10-CM | POA: Diagnosis not present

## 2017-02-11 ENCOUNTER — Encounter: Payer: Self-pay | Admitting: *Deleted

## 2017-02-11 ENCOUNTER — Other Ambulatory Visit: Payer: Self-pay | Admitting: Allergy and Immunology

## 2017-02-11 DIAGNOSIS — J3089 Other allergic rhinitis: Secondary | ICD-10-CM

## 2017-02-11 DIAGNOSIS — J301 Allergic rhinitis due to pollen: Secondary | ICD-10-CM | POA: Diagnosis not present

## 2017-02-11 NOTE — Progress Notes (Signed)
4 VIAL SET MADE. EXP: 02-11-18. HC

## 2017-02-16 ENCOUNTER — Ambulatory Visit (INDEPENDENT_AMBULATORY_CARE_PROVIDER_SITE_OTHER): Payer: Non-veteran care

## 2017-02-16 DIAGNOSIS — J309 Allergic rhinitis, unspecified: Secondary | ICD-10-CM

## 2017-02-16 NOTE — Progress Notes (Signed)
Immunotherapy   Patient Details  Name: NICOLUS OSE MRN: [id number] Date of Birth: 1950-10-13  02/16/2017  [name] started injections for  D-Mites,Weed,Grass,Cat Following schedule: B  Frequency:1-2 times per week Epi-Pen:Prescription for Epi-Pen given Consent signed and patient instructions given.   Jonette Eva 02/16/2017, 4:02 PM

## 2017-02-18 ENCOUNTER — Ambulatory Visit (INDEPENDENT_AMBULATORY_CARE_PROVIDER_SITE_OTHER): Payer: Non-veteran care

## 2017-02-18 DIAGNOSIS — J309 Allergic rhinitis, unspecified: Secondary | ICD-10-CM | POA: Diagnosis not present

## 2017-02-21 ENCOUNTER — Ambulatory Visit (INDEPENDENT_AMBULATORY_CARE_PROVIDER_SITE_OTHER): Payer: Non-veteran care | Admitting: *Deleted

## 2017-02-21 DIAGNOSIS — J309 Allergic rhinitis, unspecified: Secondary | ICD-10-CM | POA: Diagnosis not present

## 2017-02-25 ENCOUNTER — Ambulatory Visit: Payer: Self-pay

## 2017-02-28 ENCOUNTER — Ambulatory Visit (INDEPENDENT_AMBULATORY_CARE_PROVIDER_SITE_OTHER): Payer: Non-veteran care | Admitting: *Deleted

## 2017-02-28 DIAGNOSIS — J309 Allergic rhinitis, unspecified: Secondary | ICD-10-CM | POA: Diagnosis not present

## 2017-03-02 ENCOUNTER — Ambulatory Visit (INDEPENDENT_AMBULATORY_CARE_PROVIDER_SITE_OTHER): Payer: Non-veteran care | Admitting: *Deleted

## 2017-03-02 DIAGNOSIS — J309 Allergic rhinitis, unspecified: Secondary | ICD-10-CM | POA: Diagnosis not present

## 2017-03-08 ENCOUNTER — Ambulatory Visit (INDEPENDENT_AMBULATORY_CARE_PROVIDER_SITE_OTHER): Payer: Non-veteran care | Admitting: *Deleted

## 2017-03-08 DIAGNOSIS — J309 Allergic rhinitis, unspecified: Secondary | ICD-10-CM

## 2017-03-11 ENCOUNTER — Ambulatory Visit (INDEPENDENT_AMBULATORY_CARE_PROVIDER_SITE_OTHER): Payer: Non-veteran care

## 2017-03-11 DIAGNOSIS — J309 Allergic rhinitis, unspecified: Secondary | ICD-10-CM

## 2017-03-14 ENCOUNTER — Ambulatory Visit (INDEPENDENT_AMBULATORY_CARE_PROVIDER_SITE_OTHER): Payer: Non-veteran care | Admitting: Allergy & Immunology

## 2017-03-14 ENCOUNTER — Encounter: Payer: Self-pay | Admitting: Allergy & Immunology

## 2017-03-14 VITALS — BP 114/70 | HR 86 | Temp 98.9°F | Resp 18

## 2017-03-14 DIAGNOSIS — J4530 Mild persistent asthma, uncomplicated: Secondary | ICD-10-CM | POA: Diagnosis not present

## 2017-03-14 DIAGNOSIS — J302 Other seasonal allergic rhinitis: Secondary | ICD-10-CM | POA: Diagnosis not present

## 2017-03-14 DIAGNOSIS — J0100 Acute maxillary sinusitis, unspecified: Secondary | ICD-10-CM

## 2017-03-14 DIAGNOSIS — J3089 Other allergic rhinitis: Secondary | ICD-10-CM | POA: Diagnosis not present

## 2017-03-14 NOTE — Patient Instructions (Addendum)
1. Acute non-recurrent maxillary sinusitis - With your current symptoms and time course, antibiotics are not needed.  - If symptoms are not improving in 1-2 days, feel free to call us or email me at [email or web address] and we can send in an antibiotic at that time.  - Add on nasal saline spray (i.e., Simply Saline) or nasal saline lavage (i.e., NeilMed) as needed prior to medicated nasal sprays. - For thick post nasal drainage, add guaifenesin [phone] mg (Mucinex)  twice daily as needed with adequate hydration as discussed. - Start the prednisone pack provided today.   2. Mild persistent asthma, uncomplicated - Lung testing was slightly low today, but it did improve with the nebulizer treatment. - I anticipate that you are experiencing an asthma exacerbation secondary to a viral sinus infection. - Daily controller medication(s): Singulair 10mg  once daily - Prior to physical activity: ProAir 2 puffs 10-15 minutes before physical activity. - Rescue medications: ProAir 4 puffs every 4-6 hours as needed - Asthma control goals:  * Full participation in all desired activities (may need albuterol before activity) * Albuterol use two time or less a week on average (not counting use with activity) * Cough interfering with sleep two time or less a month * Oral steroids no more than once a year * No hospitalizations  3. Seasonal and perennial allergic rhinitis - Continue with allergy shots at the same schedule. - If you come tomorrow or Wednesday, you could still get a second injection on Friday this week. - However, I do not think it would be a good idea to get a shot this week.   4. Return in about 3 months (around 06/14/2017).   Please inform us of any Emergency Department visits, hospitalizations, or changes in symptoms. Call us before going to the ED for breathing or allergy symptoms since we might be able to fit you in for a sick visit. Feel free to contact us anytime with any  questions, problems, or concerns.  It was a pleasure to meet you today! Enjoy the fall season!  Websites that have reliable patient information: 1. American Academy of Asthma, Allergy, and Immunology: www.aaaai.org 2. Food Allergy Research and Education (FARE): foodallergy.org 3. Mothers of Asthmatics: http://www.asthmacommunitynetwork.org 4. American College of Allergy, Asthma, and Immunology: www.acaai.org   Election Day is coming up on Tuesday, November 6th! Although it is too late to register to vote by mail, you can still register up to November 5th at any of the early voting locations. Try to early vote in case there are problems with your registration!   If you are turned away at the polls, you have the right to request a provisional ballot, which is required by law!      [place]- [place] (open 8am - 5pm) First Floor [place], [place] (open [place]) [place], [place] (open 7am - 7pm)  [place], [employer] (open 7am - 7pm) [address], [employer] (open 7am - 7pm) [address] [place], [employer] (open 7am - 7pm) [place], [place] (open [place]) [place], [place] (open 7am - 7pm) [place], [employer] (open 7am - 7pm) [place], [place]

## 2017-03-14 NOTE — Progress Notes (Signed)
FOLLOW UP  Date of Service/Encounter:  03/14/17   Assessment:   Acute non-recurrent maxillary sinusitis  Mild persistent asthma, uncomplicated  Seasonal and perennial allergic rhinitis - on immunotherapy   Plan/Recommendations:   1. Acute non-recurrent maxillary sinusitis - With your current symptoms and time course, antibiotics are not needed.  - If symptoms are not improving in 1-2 days, feel free to call us or email me at [email or web address] and we can send in an antibiotic at that time.  - Add on nasal saline spray (i.e., Simply Saline) or nasal saline lavage (i.e., NeilMed) as needed prior to medicated nasal sprays. - For thick post nasal drainage, add guaifenesin [phone] mg (Mucinex)  twice daily as needed with adequate hydration as discussed. - Start the prednisone pack provided today.   2. Mild persistent asthma, uncomplicated - Lung testing was slightly low today, but it did improve with the nebulizer treatment. - I anticipate that you are experiencing an asthma exacerbation secondary to a viral sinus infection. - Daily controller medication(s): Singulair 10mg  once daily - Prior to physical activity: ProAir 2 puffs 10-15 minutes before physical activity. - Rescue medications: ProAir 4 puffs every 4-6 hours as needed - Asthma control goals:  * Full participation in all desired activities (may need albuterol before activity) * Albuterol use two time or less a week on average (not counting use with activity) * Cough interfering with sleep two time or less a month * Oral steroids no more than once a year * No hospitalizations  3. Seasonal and perennial allergic rhinitis - Continue with allergy shots at the same schedule. - If you come tomorrow or Wednesday, you could still get a second injection on Friday this week. - However, I do not think it would be a good idea to get a shot this week.   4. Return in about 3 months (around 06/14/2017).   Subjective:     William Hicks is a 66 y.o. male presenting today for follow up of  Chief Complaint  Patient presents with  . Cough    productive cough with yellow sputum, chills, some body aching, possible fever, sore throat. symptoms ongoing since Friday.     William Hicks has a history of the following: Patient Active Problem List   Diagnosis Date Noted  . Malignant neoplasm of prostate ([place]) 05/06/2015  . Degenerative arthritis of hip 11/14/2013  . HLD (hyperlipidemia) 09/25/2012  . GERD (gastroesophageal reflux disease) 08/17/2011  . Abscess, gluteal, right 12/22/2010  . Abscess, gluteal 12/18/2010  . HEARTBURN 10/30/2008  . PERSONAL HISTORY OF COLONIC POLYPS 10/30/2008    History obtained from: chart review and patient.  [name]'s Primary Care Provider is Raji, Sharlynn Oliphant, NP.     William Hicks is a 66 y.o. male presenting for a sick visit. He was last seen in September 2018 by Dr. Neldon Mc. At that time, he was doing better from a respiratory perspective. He was continued on Nasacort as well as Singulair with cetirizine and ProAir as needed. He was continued on his immunotherapy regimen. He had recent labs for a history of recurrent infections that was normal.   Since the last visit, he has not done well. He endorses 4-5 days of coughing and sinus pressure with postnasal drip. He has been using Robitussin-DM with minimal relief as well as ibuprofen for the headaches. He is not using nasal saline rinses on a routine basis. He remains on his Singulair, and has used his albuterol rescue inhaler  2-3 times daily since symptoms began. He has remained afebrile.He works as a Quarry manager at a nursing home, and therefore has been around some sick individuals.  He remains on his allergy shots, and has been tolerating these well. He was scheduled to receive an injection today. Otherwise, there have been no changes to his past medical history, surgical history, family history, or social  history.    Review of Systems: a 14-point review of systems is pertinent for what is mentioned in HPI.  Otherwise, all other systems were negative. Constitutional: negative other than that listed in the HPI Eyes: negative other than that listed in the HPI Ears, nose, mouth, throat, and face: negative other than that listed in the HPI Respiratory: negative other than that listed in the HPI Cardiovascular: negative other than that listed in the HPI Gastrointestinal: negative other than that listed in the HPI Genitourinary: negative other than that listed in the HPI Integument: negative other than that listed in the HPI Hematologic: negative other than that listed in the HPI Musculoskeletal: negative other than that listed in the HPI Neurological: negative other than that listed in the HPI Allergy/Immunologic: negative other than that listed in the HPI    Objective:   Blood pressure 114/70, pulse 86, temperature 98.9 F (37.2 C), temperature source Oral, resp. rate 18, SpO2 99 %. There is no height or weight on file to calculate BMI.   Physical Exam:  General: Alert, interactive, in no acute distress. Pleasant male. Eyes: No conjunctival injection bilaterally, no discharge on the right, no discharge on the left and no Horner-Trantas dots present. PERRL bilaterally. EOMI without pain. No photophobia.  Ears: Right TM pearly gray with normal light reflex, Left TM pearly gray with normal light reflex, Right TM intact without perforation and Left TM intact without perforation.  Nose/Throat: External nose within normal limits and septum midline. Turbinates edematous and pale with clear discharge. Posterior oropharynx erythematous without cobblestoning in the posterior oropharynx. Tonsils 2+ without exudates.  Tongue without thrush. Adenopathy: shoddy bilateral anterior cervical lymphadenopathy Lungs: Clear to auscultation without wheezing, rhonchi or rales. No increased work of  breathing. CV: Normal S1/S2. No murmurs. Capillary refill <2 seconds.  Skin: Warm and dry, without lesions or rashes. Neuro:   Grossly intact. No focal deficits appreciated. Responsive to questions.  Diagnostic studies:   Spirometry: results abnormal (FEV1: 3.33/147%, FVC: 4.07/138%, FEV1/FVC: 82%).    Elevated FVC is indicative of air trapping, which is suggestive of obstructive disease. Albuterol/Atrovent nebulizer treatment given in clinic with improvement in FEV1 and FVC, but not significant per ATS criteria. FVL looked much more normal following the treatment.    Allergy Studies: none    Salvatore Marvel, MD [facility]

## 2017-03-22 ENCOUNTER — Ambulatory Visit (INDEPENDENT_AMBULATORY_CARE_PROVIDER_SITE_OTHER): Payer: Non-veteran care | Admitting: *Deleted

## 2017-03-22 DIAGNOSIS — J309 Allergic rhinitis, unspecified: Secondary | ICD-10-CM | POA: Diagnosis not present

## 2017-03-25 ENCOUNTER — Ambulatory Visit (INDEPENDENT_AMBULATORY_CARE_PROVIDER_SITE_OTHER): Payer: Non-veteran care

## 2017-03-25 DIAGNOSIS — J309 Allergic rhinitis, unspecified: Secondary | ICD-10-CM | POA: Diagnosis not present

## 2017-03-30 ENCOUNTER — Ambulatory Visit (INDEPENDENT_AMBULATORY_CARE_PROVIDER_SITE_OTHER): Payer: Non-veteran care

## 2017-03-30 DIAGNOSIS — J309 Allergic rhinitis, unspecified: Secondary | ICD-10-CM | POA: Diagnosis not present

## 2017-04-04 ENCOUNTER — Ambulatory Visit (INDEPENDENT_AMBULATORY_CARE_PROVIDER_SITE_OTHER): Payer: Non-veteran care | Admitting: *Deleted

## 2017-04-04 DIAGNOSIS — J309 Allergic rhinitis, unspecified: Secondary | ICD-10-CM | POA: Diagnosis not present

## 2017-04-08 ENCOUNTER — Ambulatory Visit (INDEPENDENT_AMBULATORY_CARE_PROVIDER_SITE_OTHER): Payer: Non-veteran care

## 2017-04-08 DIAGNOSIS — J309 Allergic rhinitis, unspecified: Secondary | ICD-10-CM

## 2017-04-13 ENCOUNTER — Ambulatory Visit (INDEPENDENT_AMBULATORY_CARE_PROVIDER_SITE_OTHER): Payer: Non-veteran care

## 2017-04-13 DIAGNOSIS — J309 Allergic rhinitis, unspecified: Secondary | ICD-10-CM | POA: Diagnosis not present

## 2017-04-18 ENCOUNTER — Ambulatory Visit (INDEPENDENT_AMBULATORY_CARE_PROVIDER_SITE_OTHER): Payer: Non-veteran care

## 2017-04-18 DIAGNOSIS — J309 Allergic rhinitis, unspecified: Secondary | ICD-10-CM | POA: Diagnosis not present

## 2017-04-22 ENCOUNTER — Ambulatory Visit (INDEPENDENT_AMBULATORY_CARE_PROVIDER_SITE_OTHER): Payer: Non-veteran care

## 2017-04-22 DIAGNOSIS — J309 Allergic rhinitis, unspecified: Secondary | ICD-10-CM | POA: Diagnosis not present

## 2017-04-26 ENCOUNTER — Ambulatory Visit: Payer: Non-veteran care | Admitting: Allergy and Immunology

## 2017-04-28 ENCOUNTER — Ambulatory Visit (INDEPENDENT_AMBULATORY_CARE_PROVIDER_SITE_OTHER): Payer: Non-veteran care | Admitting: *Deleted

## 2017-04-28 DIAGNOSIS — J309 Allergic rhinitis, unspecified: Secondary | ICD-10-CM

## 2017-05-03 ENCOUNTER — Ambulatory Visit (INDEPENDENT_AMBULATORY_CARE_PROVIDER_SITE_OTHER): Payer: Non-veteran care | Admitting: *Deleted

## 2017-05-03 DIAGNOSIS — J309 Allergic rhinitis, unspecified: Secondary | ICD-10-CM

## 2017-05-06 ENCOUNTER — Ambulatory Visit (INDEPENDENT_AMBULATORY_CARE_PROVIDER_SITE_OTHER): Payer: Non-veteran care

## 2017-05-06 DIAGNOSIS — J309 Allergic rhinitis, unspecified: Secondary | ICD-10-CM

## 2017-05-11 ENCOUNTER — Ambulatory Visit (INDEPENDENT_AMBULATORY_CARE_PROVIDER_SITE_OTHER): Payer: Non-veteran care

## 2017-05-11 DIAGNOSIS — J309 Allergic rhinitis, unspecified: Secondary | ICD-10-CM | POA: Diagnosis not present

## 2017-05-13 ENCOUNTER — Telehealth: Payer: Self-pay

## 2017-05-13 NOTE — Telephone Encounter (Signed)
Authorization request faxed to [facility] contact center. [phone]. Patients referral exp 05/23/2017.

## 2017-05-18 ENCOUNTER — Ambulatory Visit (INDEPENDENT_AMBULATORY_CARE_PROVIDER_SITE_OTHER): Payer: Non-veteran care

## 2017-05-18 DIAGNOSIS — J309 Allergic rhinitis, unspecified: Secondary | ICD-10-CM | POA: Diagnosis not present

## 2017-05-25 ENCOUNTER — Ambulatory Visit (INDEPENDENT_AMBULATORY_CARE_PROVIDER_SITE_OTHER): Payer: Self-pay | Admitting: *Deleted

## 2017-05-25 DIAGNOSIS — J309 Allergic rhinitis, unspecified: Secondary | ICD-10-CM

## 2017-05-31 ENCOUNTER — Ambulatory Visit (INDEPENDENT_AMBULATORY_CARE_PROVIDER_SITE_OTHER): Payer: Non-veteran care | Admitting: *Deleted

## 2017-05-31 DIAGNOSIS — J309 Allergic rhinitis, unspecified: Secondary | ICD-10-CM

## 2017-06-01 NOTE — Telephone Encounter (Signed)
Contacted patient to let him know I haven't heard anything from the [place]. Patient will give them a call today.

## 2017-06-03 NOTE — Telephone Encounter (Signed)
Patient will now be Self Pay due to the [place] denying a new authorization until he goes back to see his ENT. Patient will notify us once he gets his authorization back.

## 2018-03-29 DIAGNOSIS — Z87891 Personal history of nicotine dependence: Secondary | ICD-10-CM | POA: Diagnosis not present

## 2018-03-29 DIAGNOSIS — J342 Deviated nasal septum: Secondary | ICD-10-CM | POA: Diagnosis not present

## 2018-03-29 DIAGNOSIS — Z7289 Other problems related to lifestyle: Secondary | ICD-10-CM | POA: Diagnosis not present

## 2018-03-29 DIAGNOSIS — J324 Chronic pansinusitis: Secondary | ICD-10-CM | POA: Diagnosis not present

## 2018-03-29 DIAGNOSIS — J3089 Other allergic rhinitis: Secondary | ICD-10-CM | POA: Diagnosis not present

## 2018-05-01 DIAGNOSIS — J329 Chronic sinusitis, unspecified: Secondary | ICD-10-CM | POA: Diagnosis not present

## 2018-05-01 DIAGNOSIS — J324 Chronic pansinusitis: Secondary | ICD-10-CM | POA: Diagnosis not present

## 2018-05-01 DIAGNOSIS — J3489 Other specified disorders of nose and nasal sinuses: Secondary | ICD-10-CM | POA: Diagnosis not present

## 2018-05-23 DIAGNOSIS — J338 Other polyp of sinus: Secondary | ICD-10-CM | POA: Diagnosis not present

## 2018-05-23 DIAGNOSIS — J322 Chronic ethmoidal sinusitis: Secondary | ICD-10-CM | POA: Diagnosis not present

## 2018-05-23 DIAGNOSIS — J323 Chronic sphenoidal sinusitis: Secondary | ICD-10-CM | POA: Diagnosis not present

## 2018-05-23 DIAGNOSIS — J320 Chronic maxillary sinusitis: Secondary | ICD-10-CM | POA: Diagnosis not present

## 2018-05-23 DIAGNOSIS — J321 Chronic frontal sinusitis: Secondary | ICD-10-CM | POA: Diagnosis not present

## 2018-05-23 DIAGNOSIS — J329 Chronic sinusitis, unspecified: Secondary | ICD-10-CM | POA: Diagnosis not present

## 2018-05-23 DIAGNOSIS — J324 Chronic pansinusitis: Secondary | ICD-10-CM | POA: Diagnosis not present

## 2018-05-29 DIAGNOSIS — J324 Chronic pansinusitis: Secondary | ICD-10-CM | POA: Diagnosis not present

## 2018-06-07 DIAGNOSIS — J324 Chronic pansinusitis: Secondary | ICD-10-CM | POA: Diagnosis not present

## 2018-06-22 DIAGNOSIS — J324 Chronic pansinusitis: Secondary | ICD-10-CM | POA: Diagnosis not present

## 2018-06-25 ENCOUNTER — Other Ambulatory Visit: Payer: Self-pay

## 2018-06-25 ENCOUNTER — Emergency Department
Admission: EM | Admit: 2018-06-25 | Discharge: 2018-06-25 | Disposition: A | Discharge status: Home / Self Care | Payer: PPO | Attending: Emergency Medicine | Admitting: Emergency Medicine

## 2018-06-25 ENCOUNTER — Emergency Department: Payer: PPO

## 2018-06-25 DIAGNOSIS — Z8546 Personal history of malignant neoplasm of prostate: Secondary | ICD-10-CM | POA: Insufficient documentation

## 2018-06-25 DIAGNOSIS — R0789 Other chest pain: Secondary | ICD-10-CM | POA: Insufficient documentation

## 2018-06-25 DIAGNOSIS — R079 Chest pain, unspecified: Secondary | ICD-10-CM | POA: Diagnosis not present

## 2018-06-25 DIAGNOSIS — Z87891 Personal history of nicotine dependence: Secondary | ICD-10-CM | POA: Insufficient documentation

## 2018-06-25 DIAGNOSIS — J45909 Unspecified asthma, uncomplicated: Secondary | ICD-10-CM | POA: Diagnosis not present

## 2018-06-25 DIAGNOSIS — Z79899 Other long term (current) drug therapy: Secondary | ICD-10-CM | POA: Diagnosis not present

## 2018-06-25 LAB — BASIC METABOLIC PANEL
Anion gap: 5 (ref 5–15)
BUN: 16 mg/dL (ref 8–23)
CO2: 30 mmol/L (ref 22–32)
Calcium: 9.2 mg/dL (ref 8.9–10.3)
Chloride: 105 mmol/L (ref 98–111)
Creatinine, Ser: 0.82 mg/dL (ref 0.61–1.24)
GFR calc Af Amer: >60 mL/min (ref >60)
GFR calc non Af Amer: >60 mL/min (ref >60)
Glucose, Bld: 92 mg/dL (ref 70–99)
Potassium: 3.7 mmol/L (ref 3.5–5.1)
Sodium: 140 mmol/L (ref 135–145)

## 2018-06-25 LAB — CBC
HCT: 37.8 % — ABNORMAL LOW (ref 39.0–52.0)
Hemoglobin: 12.4 g/dL — ABNORMAL LOW (ref 13.0–17.0)
MCH: 24.6 pg — ABNORMAL LOW (ref 26.0–34.0)
MCHC: 32.8 g/dL (ref 30.0–36.0)
MCV: 74.9 fL — ABNORMAL LOW (ref 80.0–100.0)
Platelets: 201 10*3/uL (ref 150–400)
RBC: 5.05 MIL/uL (ref 4.22–5.81)
RDW: 13 % (ref 11.5–15.5)
WBC: 5.7 10*3/uL (ref 4.0–10.5)
nRBC: 0 % (ref 0.0–0.2)

## 2018-06-25 LAB — TROPONIN I
Troponin I: <0.03 ng/mL (ref <0.03)
Troponin I: <0.03 ng/mL (ref <0.03)

## 2018-06-25 MED ORDER — SODIUM CHLORIDE 0.9% FLUSH: 3.0000 mL | Freq: Once | INTRAVENOUS | Status: DC

## 2018-06-25 MED ORDER — KETOROLAC TROMETHAMINE 30 MG/ML IJ SOLN
30.0000 mg | Freq: Once | INTRAMUSCULAR | Status: AC
  Administered 2018-06-25: 30 mg via INTRAMUSCULAR
  Filled 2018-06-25: qty 1

## 2018-06-25 NOTE — ED Notes (Signed)
Victorino Dike NP aware of patient's pain status.

## 2018-06-25 NOTE — ED Triage Notes (Addendum)
Pt comes via POV from work with c/o left sided chest pain and arm pain. Pt states he was moving some boxes to trailor and not sure if he pulled something. Pt states he started to have pain in his chest and arm.  Pt states little SOB when he breaths.  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

## 2018-06-25 NOTE — ED Provider Notes (Addendum)
-----------------------------------------   4:59 PM on 06/25/2018 -----------------------------------------  Patient signed out to me at this time, he had a mechanical episode today where he was pushing something heavy and felt a chest wall pain which he describes as a pulled muscle in his chest wall on the left.  To my exam it is quite reproducible, especially the costochondral margin, EKG is normal, and his chest x-ray and 2 sets of troponins are negative.  No crepitus no flail chest, no evidence of pneumothorax.  He and I talked about admission to the hospital versus discharge.  He has a low heart score.  Patient is adamant that he wants to go home.  Super Bowl Sunday in the last that he wants to do is spent time in the hospital he states.  Nothing to suggest PE DVT myocarditis endocarditis pneumonia pneumothorax dissection or other acute intrathoracic pathologies and his abdomen exam is completely benign.  He does have therefore reproducible chest wall pain and declines to be admitted to the hospital after 2 sets of negative enzymes and a low heart score we will therefore discharge him at his request.  He does understand that there is a limit to an ER work-up for chest pain at any time he wishes to return to the hospital, he should and he understands the need for close follow-up.  I am also calling cardiology to see if I can get him follow-up tomorrow  ----------------------------------------- 5:43 PM on 06/25/2018 -----------------------------------------  Cussed with Dr. Humphrey Rolls who agrees with management and discharge, he will arrange an appointment for the patient tomorrow morning at 9:00.  We discussed presentation, history physical exam and other findings in the ER.  Patient again does not want to stay and we will discharge him.   Schuyler Amor, MD 06/25/18 1702    Schuyler Amor, MD 06/25/18 [phone]

## 2018-06-25 NOTE — Discharge Instructions (Addendum)
You prefer not to be admitted the hospital which is certainly your choice but it does limit our ability to continue to observe you.   If you do have new or worrisome symptoms putting change in chest pain, shortness of breath, sweating, lightheadedness or any other concerns return to the emergency room.  Otherwise follow closely with PCP and we have arranged a cardiology meeting with you tomorrow morning at 9:00 at the above location please go.

## 2018-06-25 NOTE — ED Provider Notes (Signed)
[facility] Emergency Department Provider Note  ____________________________________________   First MD Initiated Contact with Patient 06/25/18 1154     (approximate)  I have reviewed the triage vital signs and the nursing notes.   HISTORY  Chief Complaint Chest Pain   HPI ANTAVION BARTOSZEK is a 68 y.o. male who presents to the emergency department for treatment and evaluation of left side chest pain and left arm pain and numbness.  Pain started while moving some boxes out of a trailer.  He does not recall injuring himself or pulling a muscle. No cardiac history. He is a former smoker but has not smoked in 30 years or more.  He denies any history of cardiac disease in immediate family under 56 years old.  No alleviating measures attempted prior to arrival.  Past Medical History:  Diagnosis Date  . Arthritis   . Asthma   . Epistaxis   . GERD (gastroesophageal reflux disease)   . Kidney stones   . Prostate cancer ([place])   . Sinusitis     Patient Active Problem List   Diagnosis Date Noted  . Malignant neoplasm of prostate ([place]) 05/06/2015  . Degenerative arthritis of hip 11/14/2013  . HLD (hyperlipidemia) 09/25/2012  . GERD (gastroesophageal reflux disease) 08/17/2011  . Abscess, gluteal, right 12/22/2010  . Abscess, gluteal 12/18/2010  . HEARTBURN 10/30/2008  . PERSONAL HISTORY OF COLONIC POLYPS 10/30/2008    Past Surgical History:  Procedure Laterality Date  . LITHOTRIPSY  2008    Prior to Admission medications   Medication Sig Start Date End Date Taking? Authorizing Provider  albuterol (PROAIR HFA) 108 (90 Base) MCG/ACT inhaler Inhale into the lungs.    [provider]  fluticasone (FLONASE) 50 MCG/ACT nasal spray Place into the nose.    [provider]  montelukast (SINGULAIR) 10 MG tablet Take 1 tablet (10 mg total) by mouth at bedtime. 12/28/16   Kozlow, Donnamarie Poag, MD  omeprazole (PRILOSEC) 40 MG capsule Take 40 mg by  mouth daily.    [provider]  triamcinolone (NASACORT ALLERGY 24HR) 55 MCG/ACT AERO nasal inhaler Place 1 spray into the nose daily.    [provider]  fluticasone (VERAMYST) 27.5 MCG/SPRAY nasal spray Place 2 sprays into the nose daily.    08/17/11  [provider]    Allergies Aspirin  Family History  Problem Relation Age of Onset  . Alzheimer's disease Father     Social History Social History   Tobacco Use  . Smoking status: Former Research scientist (life sciences)  . Smokeless tobacco: Never Used  Substance Use Topics  . Alcohol use: Yes  . Drug use: No    Review of Systems  Constitutional: No fever/chills. Eyes: No visual changes. ENT: No sore throat. Cardiovascular: Positive for chest pain. Negative for pleuritic pain. Negative for palpitations. Negative for leg pain. Respiratory: Positive for shortness of breath with exertion.  Gastrointestinal: Negative for abdominal pain.  Negative for nausea, no vomiting.  No diarrhea.  No constipation. Genitourinary: Negative for dysuria. Musculoskeletal: Negative for back pain.  Skin: Negative for rash, lesion, wound. Neurological: Negative for headaches, focal weakness or numbness. ____________________________________________   PHYSICAL EXAM:  VITAL SIGNS: ED Triage Vitals  Enc Vitals Group     BP 06/25/18 1109 (!) 162/83     Pulse Rate 06/25/18 1109 77     Resp 06/25/18 1109 18     Temp 06/25/18 1109 98.3 F (36.8 C)     Temp Source 06/25/18 1109  Oral     SpO2 06/25/18 1109 100 %     Weight 06/25/18 1110 220 lb (99.8 kg)     Height 06/25/18 1110 6\' 1"  (1.854 m)     Head Circumference --      Peak Flow --      Pain Score 06/25/18 1110 9     Pain Loc --      Pain Edu? --      Excl. in [place]? --     Constitutional: Alert and oriented.  Uncomfortable appearing and in no acute distress.  Normal mental status. Eyes: Conjunctivae are normal. PERRL. Head: Atraumatic. Nose: No congestion/rhinnorhea. Mouth/Throat:  Mucous membranes are moist.  Oropharynx non-erythematous. Tongue normal in size and color. Neck: No stridor.  No carotid bruit appreciated on exam. Hematological/Lymphatic/Immunilogical: No cervical lymphadenopathy. Cardiovascular: Normal rate, regular rhythm. Grossly normal heart sounds.  Good peripheral circulation. Respiratory: Normal respiratory effort.  No retractions. Lungs CTAB. Gastrointestinal: Soft and nontender. No distention. No abdominal bruits. No CVA tenderness. Genitourinary: Exam deferred. Musculoskeletal: No lower extremity tenderness. No edema of extremities. Neurologic:  Normal speech and language. No gross focal neurologic deficits are appreciated. Skin:  Skin is warm, dry and intact. No rash noted. Psychiatric: Mood and affect are normal. Speech and behavior are normal.  ____________________________________________   LABS (all labs ordered are listed, but only abnormal results are displayed)  Labs Reviewed  CBC - Abnormal; Notable for the following components:      Result Value   Hemoglobin 12.4 (*)    HCT 37.8 (*)    MCV 74.9 (*)    MCH 24.6 (*)    All other components within normal limits  BASIC METABOLIC PANEL  TROPONIN I  TROPONIN I   ____________________________________________  EKG  ED ECG REPORT I, Vlad Mayberry, FNP-BC personally viewed and interpreted this ECG.   Date: 06/25/2018  EKG Time: 1112  Rate: 71  Rhythm: normal sinus rhythm  Axis: normal  Intervals:first-degree A-V block   ST&T Change: No elevation  ____________________________________________  RADIOLOGY  ED MD interpretation: Chest x-ray negative for any acute cardiopulmonary abnormality per radiology.  Official radiology report(s): Dg Chest 2 View  Result Date: 06/25/2018 CLINICAL DATA:  Chest pain. EXAM: CHEST - 2 VIEW COMPARISON:  None. FINDINGS: The heart size and mediastinal contours are within normal limits. Both lungs are clear. No pneumothorax or pleural effusion is  noted. The visualized skeletal structures are unremarkable. IMPRESSION: No active cardiopulmonary disease. Electronically Signed   By: Marijo Conception, M.D.   On: 06/25/2018 11:48    ____________________________________________   PROCEDURES  Procedure(s) performed: None  Procedures  Critical Care performed: No  ____________________________________________   INITIAL IMPRESSION / ASSESSMENT AND PLAN / ED COURSE  As part of my medical decision making, I reviewed the following data within the electronic MEDICAL RECORD NUMBER    68 year old male presents to the emergency department for treatment and evaluation of chest and left arm pain that started during exertion while unloading furniture from a trailer.  Other possibly musculoskeletal, cardiac work-up will be completed.  Toradol will be given and then reevaluated.  Plan will be to do serial troponins and EKGs prior to disposition.  No relief of the pain with Toradol.  Patient is intolerant of aspirin.  He will be moved to room 8 for further monitoring.  A second troponin and EKG has been ordered.  Patient care relinquished to Dr. Burlene Arnt who will follow up on the troponin and will decide patient's  disposition.   ____________________________________________   FINAL CLINICAL IMPRESSION(S) / ED DIAGNOSES  Final diagnoses:  Chest pain, unspecified type     ED Discharge Orders    None       Note:  This document was prepared using Dragon voice recognition software and may include unintentional dictation errors.    Victorino Dike, FNP 06/25/18 1556    Schuyler Amor, MD 06/26/18 [phone]

## 2018-06-25 NOTE — ED Notes (Signed)
Report given to Annie RN 

## 2018-06-30 ENCOUNTER — Other Ambulatory Visit
Admission: RE | Admit: 2018-06-30 | Discharge: 2018-06-30 | Disposition: A | Discharge status: Home / Self Care | Payer: PPO | Source: Ambulatory Visit | Attending: Pediatrics | Admitting: Pediatrics

## 2018-06-30 DIAGNOSIS — R079 Chest pain, unspecified: Secondary | ICD-10-CM | POA: Insufficient documentation

## 2018-06-30 LAB — FIBRIN DERIVATIVES D-DIMER (ARMC ONLY): Fibrin derivatives D-dimer (ARMC): 431.43 ng/mL (FEU) (ref 0.00–499.00)

## 2018-07-19 DIAGNOSIS — E663 Overweight: Secondary | ICD-10-CM | POA: Diagnosis not present

## 2018-07-19 DIAGNOSIS — Z6829 Body mass index (BMI) 29.0-29.9, adult: Secondary | ICD-10-CM | POA: Diagnosis not present

## 2018-07-19 DIAGNOSIS — Z0000 Encounter for general adult medical examination without abnormal findings: Secondary | ICD-10-CM | POA: Diagnosis not present

## 2018-07-19 DIAGNOSIS — R030 Elevated blood-pressure reading, without diagnosis of hypertension: Secondary | ICD-10-CM | POA: Diagnosis not present

## 2018-07-19 DIAGNOSIS — Z8546 Personal history of malignant neoplasm of prostate: Secondary | ICD-10-CM | POA: Diagnosis not present

## 2018-07-19 DIAGNOSIS — R0981 Nasal congestion: Secondary | ICD-10-CM | POA: Diagnosis not present

## 2018-07-21 ENCOUNTER — Other Ambulatory Visit: Payer: Self-pay | Admitting: Physician Assistant

## 2018-07-21 DIAGNOSIS — Z136 Encounter for screening for cardiovascular disorders: Secondary | ICD-10-CM

## 2018-08-17 DIAGNOSIS — H6981 Other specified disorders of Eustachian tube, right ear: Secondary | ICD-10-CM | POA: Diagnosis not present

## 2018-10-03 ENCOUNTER — Ambulatory Visit
Admission: RE | Admit: 2018-10-03 | Discharge: 2018-10-03 | Disposition: A | Discharge status: Home / Self Care | Payer: PPO | Source: Ambulatory Visit | Attending: Physician Assistant | Admitting: Physician Assistant

## 2018-10-03 DIAGNOSIS — Z136 Encounter for screening for cardiovascular disorders: Secondary | ICD-10-CM | POA: Diagnosis not present

## 2018-10-03 DIAGNOSIS — Z87891 Personal history of nicotine dependence: Secondary | ICD-10-CM | POA: Diagnosis not present

## 2018-12-01 DIAGNOSIS — J4521 Mild intermittent asthma with (acute) exacerbation: Secondary | ICD-10-CM | POA: Diagnosis not present

## 2019-01-04 DIAGNOSIS — H0288B Meibomian gland dysfunction left eye, upper and lower eyelids: Secondary | ICD-10-CM | POA: Diagnosis not present

## 2019-01-04 DIAGNOSIS — H25013 Cortical age-related cataract, bilateral: Secondary | ICD-10-CM | POA: Diagnosis not present

## 2019-01-04 DIAGNOSIS — H2513 Age-related nuclear cataract, bilateral: Secondary | ICD-10-CM | POA: Diagnosis not present

## 2019-01-04 DIAGNOSIS — H16223 Keratoconjunctivitis sicca, not specified as Sjogren's, bilateral: Secondary | ICD-10-CM | POA: Diagnosis not present

## 2019-01-04 DIAGNOSIS — H1045 Other chronic allergic conjunctivitis: Secondary | ICD-10-CM | POA: Diagnosis not present

## 2019-01-04 DIAGNOSIS — H18413 Arcus senilis, bilateral: Secondary | ICD-10-CM | POA: Diagnosis not present

## 2019-01-04 DIAGNOSIS — H11153 Pinguecula, bilateral: Secondary | ICD-10-CM | POA: Diagnosis not present

## 2019-01-04 DIAGNOSIS — H0288A Meibomian gland dysfunction right eye, upper and lower eyelids: Secondary | ICD-10-CM | POA: Diagnosis not present

## 2019-03-12 ENCOUNTER — Other Ambulatory Visit: Payer: Self-pay

## 2019-03-12 ENCOUNTER — Ambulatory Visit: Payer: Self-pay

## 2019-03-12 ENCOUNTER — Other Ambulatory Visit: Payer: Self-pay | Admitting: Family Medicine

## 2019-03-12 DIAGNOSIS — M25531 Pain in right wrist: Secondary | ICD-10-CM

## 2019-03-12 DIAGNOSIS — M79641 Pain in right hand: Secondary | ICD-10-CM

## 2019-04-03 DIAGNOSIS — G629 Polyneuropathy, unspecified: Secondary | ICD-10-CM | POA: Diagnosis not present

## 2019-04-03 DIAGNOSIS — E785 Hyperlipidemia, unspecified: Secondary | ICD-10-CM | POA: Diagnosis not present

## 2019-04-11 DIAGNOSIS — Z23 Encounter for immunization: Secondary | ICD-10-CM | POA: Diagnosis not present

## 2019-04-11 DIAGNOSIS — R208 Other disturbances of skin sensation: Secondary | ICD-10-CM | POA: Diagnosis not present

## 2019-04-11 DIAGNOSIS — G629 Polyneuropathy, unspecified: Secondary | ICD-10-CM | POA: Diagnosis not present

## 2019-05-03 DIAGNOSIS — J324 Chronic pansinusitis: Secondary | ICD-10-CM | POA: Diagnosis not present

## 2019-05-14 DIAGNOSIS — M461 Sacroiliitis, not elsewhere classified: Secondary | ICD-10-CM | POA: Diagnosis not present

## 2019-05-14 DIAGNOSIS — M5416 Radiculopathy, lumbar region: Secondary | ICD-10-CM | POA: Diagnosis not present

## 2019-05-14 DIAGNOSIS — M5136 Other intervertebral disc degeneration, lumbar region: Secondary | ICD-10-CM | POA: Diagnosis not present

## 2019-06-14 DIAGNOSIS — M461 Sacroiliitis, not elsewhere classified: Secondary | ICD-10-CM | POA: Diagnosis not present

## 2019-06-16 DIAGNOSIS — R030 Elevated blood-pressure reading, without diagnosis of hypertension: Secondary | ICD-10-CM | POA: Diagnosis not present

## 2019-06-16 DIAGNOSIS — R300 Dysuria: Secondary | ICD-10-CM | POA: Diagnosis not present

## 2019-07-10 ENCOUNTER — Other Ambulatory Visit: Payer: Self-pay | Admitting: Gastroenterology

## 2019-07-10 DIAGNOSIS — R110 Nausea: Secondary | ICD-10-CM

## 2019-07-10 DIAGNOSIS — K219 Gastro-esophageal reflux disease without esophagitis: Secondary | ICD-10-CM | POA: Diagnosis not present

## 2019-07-10 DIAGNOSIS — R1310 Dysphagia, unspecified: Secondary | ICD-10-CM

## 2019-07-10 DIAGNOSIS — E785 Hyperlipidemia, unspecified: Secondary | ICD-10-CM | POA: Diagnosis not present

## 2019-07-10 DIAGNOSIS — R319 Hematuria, unspecified: Secondary | ICD-10-CM | POA: Diagnosis not present

## 2019-07-10 DIAGNOSIS — R1013 Epigastric pain: Secondary | ICD-10-CM | POA: Diagnosis not present

## 2019-07-10 DIAGNOSIS — R109 Unspecified abdominal pain: Secondary | ICD-10-CM | POA: Diagnosis not present

## 2019-07-10 DIAGNOSIS — R350 Frequency of micturition: Secondary | ICD-10-CM | POA: Diagnosis not present

## 2019-07-10 DIAGNOSIS — C61 Malignant neoplasm of prostate: Secondary | ICD-10-CM | POA: Diagnosis not present

## 2019-07-10 DIAGNOSIS — R1319 Other dysphagia: Secondary | ICD-10-CM

## 2019-07-11 ENCOUNTER — Ambulatory Visit
Admission: RE | Admit: 2019-07-11 | Discharge: 2019-07-11 | Disposition: A | Discharge status: Home / Self Care | Payer: PPO | Source: Ambulatory Visit | Attending: Gastroenterology | Admitting: Gastroenterology

## 2019-07-11 ENCOUNTER — Other Ambulatory Visit: Payer: Self-pay

## 2019-07-11 DIAGNOSIS — R1319 Other dysphagia: Secondary | ICD-10-CM

## 2019-07-11 DIAGNOSIS — R110 Nausea: Secondary | ICD-10-CM

## 2019-07-11 DIAGNOSIS — R1013 Epigastric pain: Secondary | ICD-10-CM | POA: Diagnosis not present

## 2019-07-11 DIAGNOSIS — K219 Gastro-esophageal reflux disease without esophagitis: Secondary | ICD-10-CM | POA: Diagnosis not present

## 2019-07-11 DIAGNOSIS — R1310 Dysphagia, unspecified: Secondary | ICD-10-CM | POA: Diagnosis not present

## 2019-07-15 DIAGNOSIS — Z20822 Contact with and (suspected) exposure to covid-19: Secondary | ICD-10-CM | POA: Diagnosis not present

## 2019-07-17 ENCOUNTER — Other Ambulatory Visit: Payer: Self-pay | Admitting: Physician Assistant

## 2019-07-17 DIAGNOSIS — R9389 Abnormal findings on diagnostic imaging of other specified body structures: Secondary | ICD-10-CM

## 2019-07-23 DIAGNOSIS — R1314 Dysphagia, pharyngoesophageal phase: Secondary | ICD-10-CM | POA: Diagnosis not present

## 2019-07-23 DIAGNOSIS — K219 Gastro-esophageal reflux disease without esophagitis: Secondary | ICD-10-CM | POA: Diagnosis not present

## 2019-07-24 DIAGNOSIS — N390 Urinary tract infection, site not specified: Secondary | ICD-10-CM | POA: Diagnosis not present

## 2019-07-24 DIAGNOSIS — C61 Malignant neoplasm of prostate: Secondary | ICD-10-CM | POA: Diagnosis not present

## 2019-07-25 DIAGNOSIS — H1045 Other chronic allergic conjunctivitis: Secondary | ICD-10-CM | POA: Diagnosis not present

## 2019-07-25 DIAGNOSIS — H2513 Age-related nuclear cataract, bilateral: Secondary | ICD-10-CM | POA: Diagnosis not present

## 2019-07-25 DIAGNOSIS — H11153 Pinguecula, bilateral: Secondary | ICD-10-CM | POA: Diagnosis not present

## 2019-07-25 DIAGNOSIS — H18413 Arcus senilis, bilateral: Secondary | ICD-10-CM | POA: Diagnosis not present

## 2019-07-25 DIAGNOSIS — H16223 Keratoconjunctivitis sicca, not specified as Sjogren's, bilateral: Secondary | ICD-10-CM | POA: Diagnosis not present

## 2019-07-25 DIAGNOSIS — H0288B Meibomian gland dysfunction left eye, upper and lower eyelids: Secondary | ICD-10-CM | POA: Diagnosis not present

## 2019-07-25 DIAGNOSIS — H0288A Meibomian gland dysfunction right eye, upper and lower eyelids: Secondary | ICD-10-CM | POA: Diagnosis not present

## 2019-07-25 DIAGNOSIS — H25013 Cortical age-related cataract, bilateral: Secondary | ICD-10-CM | POA: Diagnosis not present

## 2019-08-07 DIAGNOSIS — J4521 Mild intermittent asthma with (acute) exacerbation: Secondary | ICD-10-CM | POA: Diagnosis not present

## 2019-08-07 DIAGNOSIS — D649 Anemia, unspecified: Secondary | ICD-10-CM | POA: Diagnosis not present

## 2019-08-07 DIAGNOSIS — E785 Hyperlipidemia, unspecified: Secondary | ICD-10-CM | POA: Diagnosis not present

## 2019-08-07 DIAGNOSIS — Z8546 Personal history of malignant neoplasm of prostate: Secondary | ICD-10-CM | POA: Diagnosis not present

## 2019-08-08 ENCOUNTER — Ambulatory Visit: Payer: Self-pay | Admitting: Urology

## 2019-08-09 DIAGNOSIS — Z86010 Personal history of colonic polyps: Secondary | ICD-10-CM | POA: Diagnosis not present

## 2019-08-09 DIAGNOSIS — R1013 Epigastric pain: Secondary | ICD-10-CM | POA: Diagnosis not present

## 2019-08-09 DIAGNOSIS — K219 Gastro-esophageal reflux disease without esophagitis: Secondary | ICD-10-CM | POA: Diagnosis not present

## 2019-08-28 DIAGNOSIS — R1013 Epigastric pain: Secondary | ICD-10-CM | POA: Diagnosis not present

## 2019-09-11 DIAGNOSIS — R110 Nausea: Secondary | ICD-10-CM | POA: Diagnosis not present

## 2019-09-11 DIAGNOSIS — D649 Anemia, unspecified: Secondary | ICD-10-CM | POA: Diagnosis not present

## 2019-09-11 DIAGNOSIS — K219 Gastro-esophageal reflux disease without esophagitis: Secondary | ICD-10-CM | POA: Diagnosis not present

## 2019-09-11 DIAGNOSIS — R1013 Epigastric pain: Secondary | ICD-10-CM | POA: Diagnosis not present

## 2019-09-11 DIAGNOSIS — Z86010 Personal history of colonic polyps: Secondary | ICD-10-CM | POA: Diagnosis not present

## 2019-09-12 ENCOUNTER — Other Ambulatory Visit: Payer: Self-pay | Admitting: Gastroenterology

## 2019-09-12 DIAGNOSIS — R110 Nausea: Secondary | ICD-10-CM

## 2019-09-14 ENCOUNTER — Ambulatory Visit
Admission: RE | Admit: 2019-09-14 | Discharge: 2019-09-14 | Disposition: A | Discharge status: Home / Self Care | Payer: PPO | Source: Ambulatory Visit | Attending: Gastroenterology | Admitting: Gastroenterology

## 2019-09-14 DIAGNOSIS — R110 Nausea: Secondary | ICD-10-CM

## 2019-09-14 DIAGNOSIS — N281 Cyst of kidney, acquired: Secondary | ICD-10-CM | POA: Diagnosis not present

## 2019-09-18 ENCOUNTER — Other Ambulatory Visit: Payer: Self-pay | Admitting: Gastroenterology

## 2019-09-18 ENCOUNTER — Other Ambulatory Visit (HOSPITAL_COMMUNITY): Payer: Self-pay | Admitting: Gastroenterology

## 2019-09-18 DIAGNOSIS — R110 Nausea: Secondary | ICD-10-CM

## 2019-09-26 DIAGNOSIS — H2513 Age-related nuclear cataract, bilateral: Secondary | ICD-10-CM | POA: Diagnosis not present

## 2019-09-26 DIAGNOSIS — H0288A Meibomian gland dysfunction right eye, upper and lower eyelids: Secondary | ICD-10-CM | POA: Diagnosis not present

## 2019-09-26 DIAGNOSIS — H18413 Arcus senilis, bilateral: Secondary | ICD-10-CM | POA: Diagnosis not present

## 2019-09-26 DIAGNOSIS — H16223 Keratoconjunctivitis sicca, not specified as Sjogren's, bilateral: Secondary | ICD-10-CM | POA: Diagnosis not present

## 2019-09-26 DIAGNOSIS — H25013 Cortical age-related cataract, bilateral: Secondary | ICD-10-CM | POA: Diagnosis not present

## 2019-09-26 DIAGNOSIS — H11153 Pinguecula, bilateral: Secondary | ICD-10-CM | POA: Diagnosis not present

## 2019-09-26 DIAGNOSIS — H0288B Meibomian gland dysfunction left eye, upper and lower eyelids: Secondary | ICD-10-CM | POA: Diagnosis not present

## 2019-10-01 ENCOUNTER — Ambulatory Visit (HOSPITAL_COMMUNITY)
Admission: RE | Admit: 2019-10-01 | Discharge: 2019-10-01 | Disposition: A | Discharge status: Home / Self Care | Payer: PPO | Source: Ambulatory Visit | Attending: Gastroenterology | Admitting: Gastroenterology

## 2019-10-01 ENCOUNTER — Other Ambulatory Visit: Payer: Self-pay

## 2019-10-01 DIAGNOSIS — R110 Nausea: Secondary | ICD-10-CM | POA: Diagnosis not present

## 2019-10-01 DIAGNOSIS — R1013 Epigastric pain: Secondary | ICD-10-CM | POA: Diagnosis not present

## 2019-10-01 MED ORDER — TECHNETIUM TC 99M MEBROFENIN IV KIT
5.5000 | PACK | Freq: Once | INTRAVENOUS | Status: AC | PRN
  Administered 2019-10-01: 5.5 via INTRAVENOUS

## 2019-11-05 DIAGNOSIS — J324 Chronic pansinusitis: Secondary | ICD-10-CM | POA: Diagnosis not present

## 2019-11-12 DIAGNOSIS — J4521 Mild intermittent asthma with (acute) exacerbation: Secondary | ICD-10-CM | POA: Diagnosis not present

## 2019-11-12 DIAGNOSIS — E785 Hyperlipidemia, unspecified: Secondary | ICD-10-CM | POA: Diagnosis not present

## 2019-11-12 DIAGNOSIS — Z8546 Personal history of malignant neoplasm of prostate: Secondary | ICD-10-CM | POA: Diagnosis not present

## 2019-11-12 DIAGNOSIS — F332 Major depressive disorder, recurrent severe without psychotic features: Secondary | ICD-10-CM | POA: Diagnosis not present

## 2019-11-12 DIAGNOSIS — D649 Anemia, unspecified: Secondary | ICD-10-CM | POA: Diagnosis not present

## 2019-11-16 DIAGNOSIS — I700 Atherosclerosis of aorta: Secondary | ICD-10-CM | POA: Diagnosis not present

## 2019-11-16 DIAGNOSIS — G629 Polyneuropathy, unspecified: Secondary | ICD-10-CM | POA: Diagnosis not present

## 2019-11-16 DIAGNOSIS — E785 Hyperlipidemia, unspecified: Secondary | ICD-10-CM | POA: Diagnosis not present

## 2019-11-20 DIAGNOSIS — F419 Anxiety disorder, unspecified: Secondary | ICD-10-CM | POA: Diagnosis not present

## 2019-11-20 DIAGNOSIS — F332 Major depressive disorder, recurrent severe without psychotic features: Secondary | ICD-10-CM | POA: Diagnosis not present

## 2019-11-20 DIAGNOSIS — G629 Polyneuropathy, unspecified: Secondary | ICD-10-CM | POA: Diagnosis not present

## 2019-12-18 DIAGNOSIS — S8992XA Unspecified injury of left lower leg, initial encounter: Secondary | ICD-10-CM | POA: Diagnosis not present

## 2019-12-18 DIAGNOSIS — M1712 Unilateral primary osteoarthritis, left knee: Secondary | ICD-10-CM | POA: Diagnosis not present

## 2019-12-18 DIAGNOSIS — W108XXA Fall (on) (from) other stairs and steps, initial encounter: Secondary | ICD-10-CM | POA: Diagnosis not present

## 2019-12-18 DIAGNOSIS — S299XXA Unspecified injury of thorax, initial encounter: Secondary | ICD-10-CM | POA: Diagnosis not present

## 2019-12-18 DIAGNOSIS — R0781 Pleurodynia: Secondary | ICD-10-CM | POA: Diagnosis not present

## 2019-12-20 DIAGNOSIS — J324 Chronic pansinusitis: Secondary | ICD-10-CM | POA: Diagnosis not present

## 2019-12-28 DIAGNOSIS — W010XXD Fall on same level from slipping, tripping and stumbling without subsequent striking against object, subsequent encounter: Secondary | ICD-10-CM | POA: Diagnosis not present

## 2019-12-28 DIAGNOSIS — S20212D Contusion of left front wall of thorax, subsequent encounter: Secondary | ICD-10-CM | POA: Diagnosis not present

## 2020-01-16 DIAGNOSIS — J322 Chronic ethmoidal sinusitis: Secondary | ICD-10-CM | POA: Diagnosis not present

## 2020-01-16 DIAGNOSIS — J3489 Other specified disorders of nose and nasal sinuses: Secondary | ICD-10-CM | POA: Diagnosis not present

## 2020-01-16 DIAGNOSIS — J320 Chronic maxillary sinusitis: Secondary | ICD-10-CM | POA: Diagnosis not present

## 2020-01-16 DIAGNOSIS — J321 Chronic frontal sinusitis: Secondary | ICD-10-CM | POA: Diagnosis not present

## 2020-02-14 DIAGNOSIS — M461 Sacroiliitis, not elsewhere classified: Secondary | ICD-10-CM | POA: Diagnosis not present

## 2020-02-14 DIAGNOSIS — M5416 Radiculopathy, lumbar region: Secondary | ICD-10-CM | POA: Diagnosis not present

## 2020-02-14 DIAGNOSIS — M5136 Other intervertebral disc degeneration, lumbar region: Secondary | ICD-10-CM | POA: Diagnosis not present

## 2020-02-15 DIAGNOSIS — G8929 Other chronic pain: Secondary | ICD-10-CM | POA: Diagnosis not present

## 2020-02-15 DIAGNOSIS — M533 Sacrococcygeal disorders, not elsewhere classified: Secondary | ICD-10-CM | POA: Diagnosis not present

## 2020-02-15 DIAGNOSIS — M461 Sacroiliitis, not elsewhere classified: Secondary | ICD-10-CM | POA: Diagnosis not present

## 2020-02-19 DIAGNOSIS — J4521 Mild intermittent asthma with (acute) exacerbation: Secondary | ICD-10-CM | POA: Diagnosis not present

## 2020-02-19 DIAGNOSIS — D649 Anemia, unspecified: Secondary | ICD-10-CM | POA: Diagnosis not present

## 2020-02-19 DIAGNOSIS — F332 Major depressive disorder, recurrent severe without psychotic features: Secondary | ICD-10-CM | POA: Diagnosis not present

## 2020-02-19 DIAGNOSIS — E785 Hyperlipidemia, unspecified: Secondary | ICD-10-CM | POA: Diagnosis not present

## 2020-02-28 ENCOUNTER — Other Ambulatory Visit: Payer: Self-pay | Admitting: Gastroenterology

## 2020-02-28 DIAGNOSIS — G8929 Other chronic pain: Secondary | ICD-10-CM | POA: Diagnosis not present

## 2020-02-28 DIAGNOSIS — M461 Sacroiliitis, not elsewhere classified: Secondary | ICD-10-CM | POA: Diagnosis not present

## 2020-02-28 DIAGNOSIS — M5416 Radiculopathy, lumbar region: Secondary | ICD-10-CM | POA: Diagnosis not present

## 2020-02-28 DIAGNOSIS — M1611 Unilateral primary osteoarthritis, right hip: Secondary | ICD-10-CM | POA: Diagnosis not present

## 2020-02-28 DIAGNOSIS — I7090 Unspecified atherosclerosis: Secondary | ICD-10-CM | POA: Diagnosis not present

## 2020-02-28 DIAGNOSIS — M47816 Spondylosis without myelopathy or radiculopathy, lumbar region: Secondary | ICD-10-CM | POA: Diagnosis not present

## 2020-02-28 DIAGNOSIS — M4726 Other spondylosis with radiculopathy, lumbar region: Secondary | ICD-10-CM | POA: Diagnosis not present

## 2020-02-28 DIAGNOSIS — M160 Bilateral primary osteoarthritis of hip: Secondary | ICD-10-CM | POA: Diagnosis not present

## 2020-02-28 DIAGNOSIS — R1031 Right lower quadrant pain: Secondary | ICD-10-CM | POA: Diagnosis not present

## 2020-02-28 DIAGNOSIS — N281 Cyst of kidney, acquired: Secondary | ICD-10-CM

## 2020-02-28 DIAGNOSIS — M533 Sacrococcygeal disorders, not elsewhere classified: Secondary | ICD-10-CM | POA: Diagnosis not present

## 2020-02-28 DIAGNOSIS — M5116 Intervertebral disc disorders with radiculopathy, lumbar region: Secondary | ICD-10-CM | POA: Diagnosis not present

## 2020-02-28 DIAGNOSIS — M5136 Other intervertebral disc degeneration, lumbar region: Secondary | ICD-10-CM | POA: Diagnosis not present

## 2020-02-29 DIAGNOSIS — D649 Anemia, unspecified: Secondary | ICD-10-CM | POA: Diagnosis not present

## 2020-03-03 DIAGNOSIS — E785 Hyperlipidemia, unspecified: Secondary | ICD-10-CM | POA: Diagnosis not present

## 2020-03-03 DIAGNOSIS — G629 Polyneuropathy, unspecified: Secondary | ICD-10-CM | POA: Diagnosis not present

## 2020-03-06 ENCOUNTER — Ambulatory Visit
Admission: RE | Admit: 2020-03-06 | Discharge: 2020-03-06 | Disposition: A | Discharge status: Home / Self Care | Payer: PPO | Source: Ambulatory Visit | Attending: Gastroenterology | Admitting: Gastroenterology

## 2020-03-06 DIAGNOSIS — N281 Cyst of kidney, acquired: Secondary | ICD-10-CM

## 2020-03-06 DIAGNOSIS — R110 Nausea: Secondary | ICD-10-CM | POA: Diagnosis not present

## 2020-03-06 DIAGNOSIS — K7689 Other specified diseases of liver: Secondary | ICD-10-CM | POA: Diagnosis not present

## 2020-03-06 DIAGNOSIS — R160 Hepatomegaly, not elsewhere classified: Secondary | ICD-10-CM | POA: Diagnosis not present

## 2020-03-11 ENCOUNTER — Other Ambulatory Visit: Payer: Self-pay | Admitting: Gastroenterology

## 2020-03-11 DIAGNOSIS — K769 Liver disease, unspecified: Secondary | ICD-10-CM

## 2020-03-22 ENCOUNTER — Ambulatory Visit: Payer: PPO | Attending: Internal Medicine

## 2020-03-22 DIAGNOSIS — Z23 Encounter for immunization: Secondary | ICD-10-CM

## 2020-03-22 NOTE — Progress Notes (Signed)
   Covid-19 Vaccination Clinic  Name:  William Hicks    MRN: [id number] DOB: 1950-09-16  03/22/2020  [name] was observed post Covid-19 immunization for 15 minutes without incident. He was provided with Vaccine Information Sheet and instruction to access the V-Safe system.   [name] was instructed to call 911 with any severe reactions post vaccine: Marland Kitchen Difficulty breathing  . Swelling of face and throat  . A fast heartbeat  . A bad rash all over body  . Dizziness and weakness

## 2020-03-31 DIAGNOSIS — G629 Polyneuropathy, unspecified: Secondary | ICD-10-CM | POA: Diagnosis not present

## 2020-03-31 DIAGNOSIS — E538 Deficiency of other specified B group vitamins: Secondary | ICD-10-CM | POA: Diagnosis not present

## 2020-04-02 ENCOUNTER — Ambulatory Visit
Admission: RE | Admit: 2020-04-02 | Discharge: 2020-04-02 | Disposition: A | Discharge status: Home / Self Care | Payer: PPO | Source: Ambulatory Visit | Attending: Gastroenterology | Admitting: Gastroenterology

## 2020-04-02 ENCOUNTER — Other Ambulatory Visit: Payer: Self-pay

## 2020-04-02 DIAGNOSIS — K769 Liver disease, unspecified: Secondary | ICD-10-CM

## 2020-04-27 ENCOUNTER — Ambulatory Visit: Admission: RE | Admit: 2020-04-27 | Payer: PPO | Source: Ambulatory Visit

## 2020-04-29 ENCOUNTER — Other Ambulatory Visit: Payer: Self-pay | Admitting: Gastroenterology

## 2020-04-29 DIAGNOSIS — K769 Liver disease, unspecified: Secondary | ICD-10-CM

## 2020-05-05 DIAGNOSIS — R300 Dysuria: Secondary | ICD-10-CM | POA: Diagnosis not present

## 2020-05-05 DIAGNOSIS — Z8546 Personal history of malignant neoplasm of prostate: Secondary | ICD-10-CM | POA: Diagnosis not present

## 2020-05-06 DIAGNOSIS — R200 Anesthesia of skin: Secondary | ICD-10-CM | POA: Diagnosis not present

## 2020-05-06 DIAGNOSIS — R202 Paresthesia of skin: Secondary | ICD-10-CM | POA: Diagnosis not present

## 2020-05-19 ENCOUNTER — Other Ambulatory Visit: Payer: PPO

## 2020-05-29 DIAGNOSIS — J019 Acute sinusitis, unspecified: Secondary | ICD-10-CM | POA: Diagnosis not present

## 2020-07-08 DIAGNOSIS — K219 Gastro-esophageal reflux disease without esophagitis: Secondary | ICD-10-CM | POA: Diagnosis not present

## 2020-07-08 DIAGNOSIS — Z86010 Personal history of colonic polyps: Secondary | ICD-10-CM | POA: Diagnosis not present

## 2020-07-08 DIAGNOSIS — D649 Anemia, unspecified: Secondary | ICD-10-CM | POA: Diagnosis not present

## 2020-07-17 DIAGNOSIS — M542 Cervicalgia: Secondary | ICD-10-CM | POA: Diagnosis not present

## 2020-07-17 DIAGNOSIS — M5412 Radiculopathy, cervical region: Secondary | ICD-10-CM | POA: Diagnosis not present

## 2020-07-22 IMAGING — CR DG CHEST 2V
1 series · 2 of 2 positions shown · non-contrast
Comparison: None.

CLINICAL DATA: Chest pain.

EXAM:
CHEST - 2 VIEW

[Series 1: dg chest 2 view · 0.14mm/px · 2 of 2 slices shown]
[im 1/2]
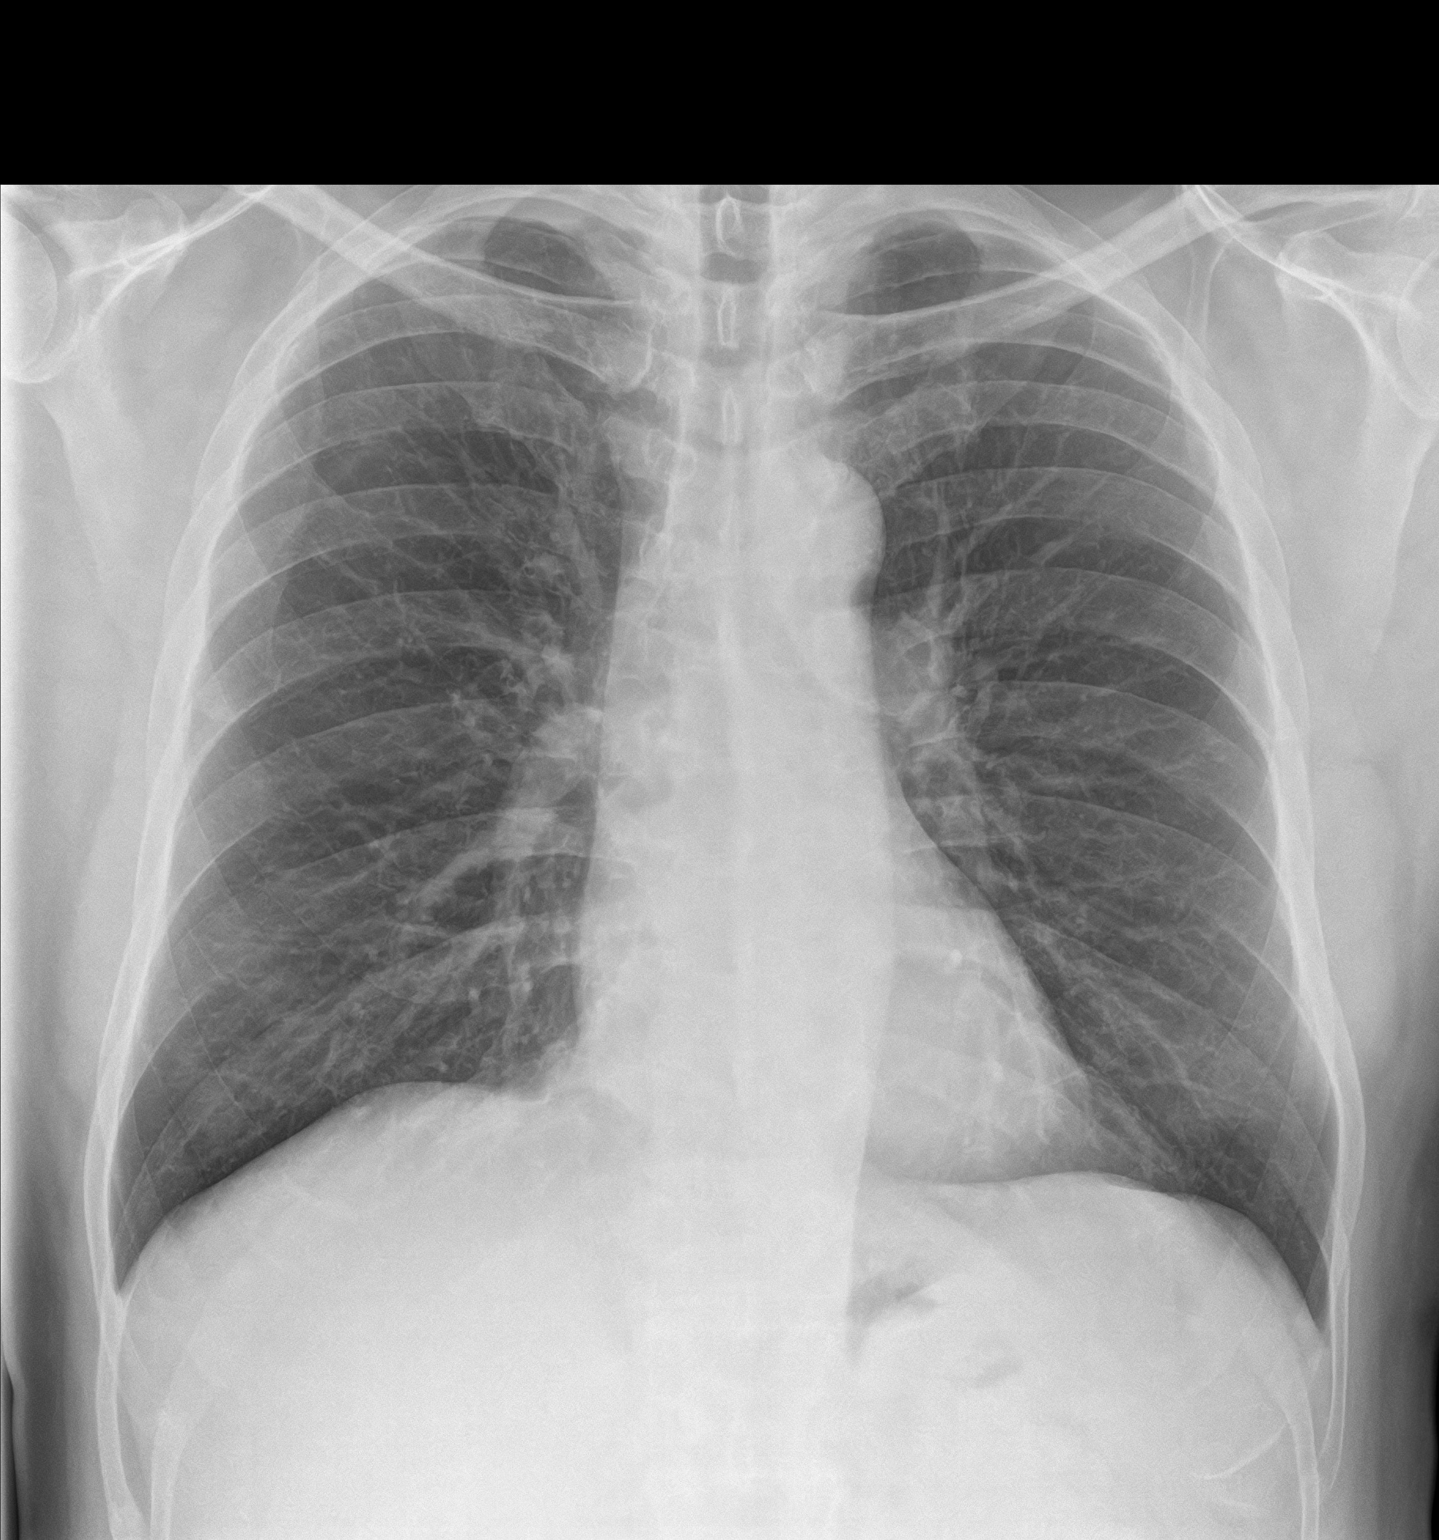
[im 2/2]
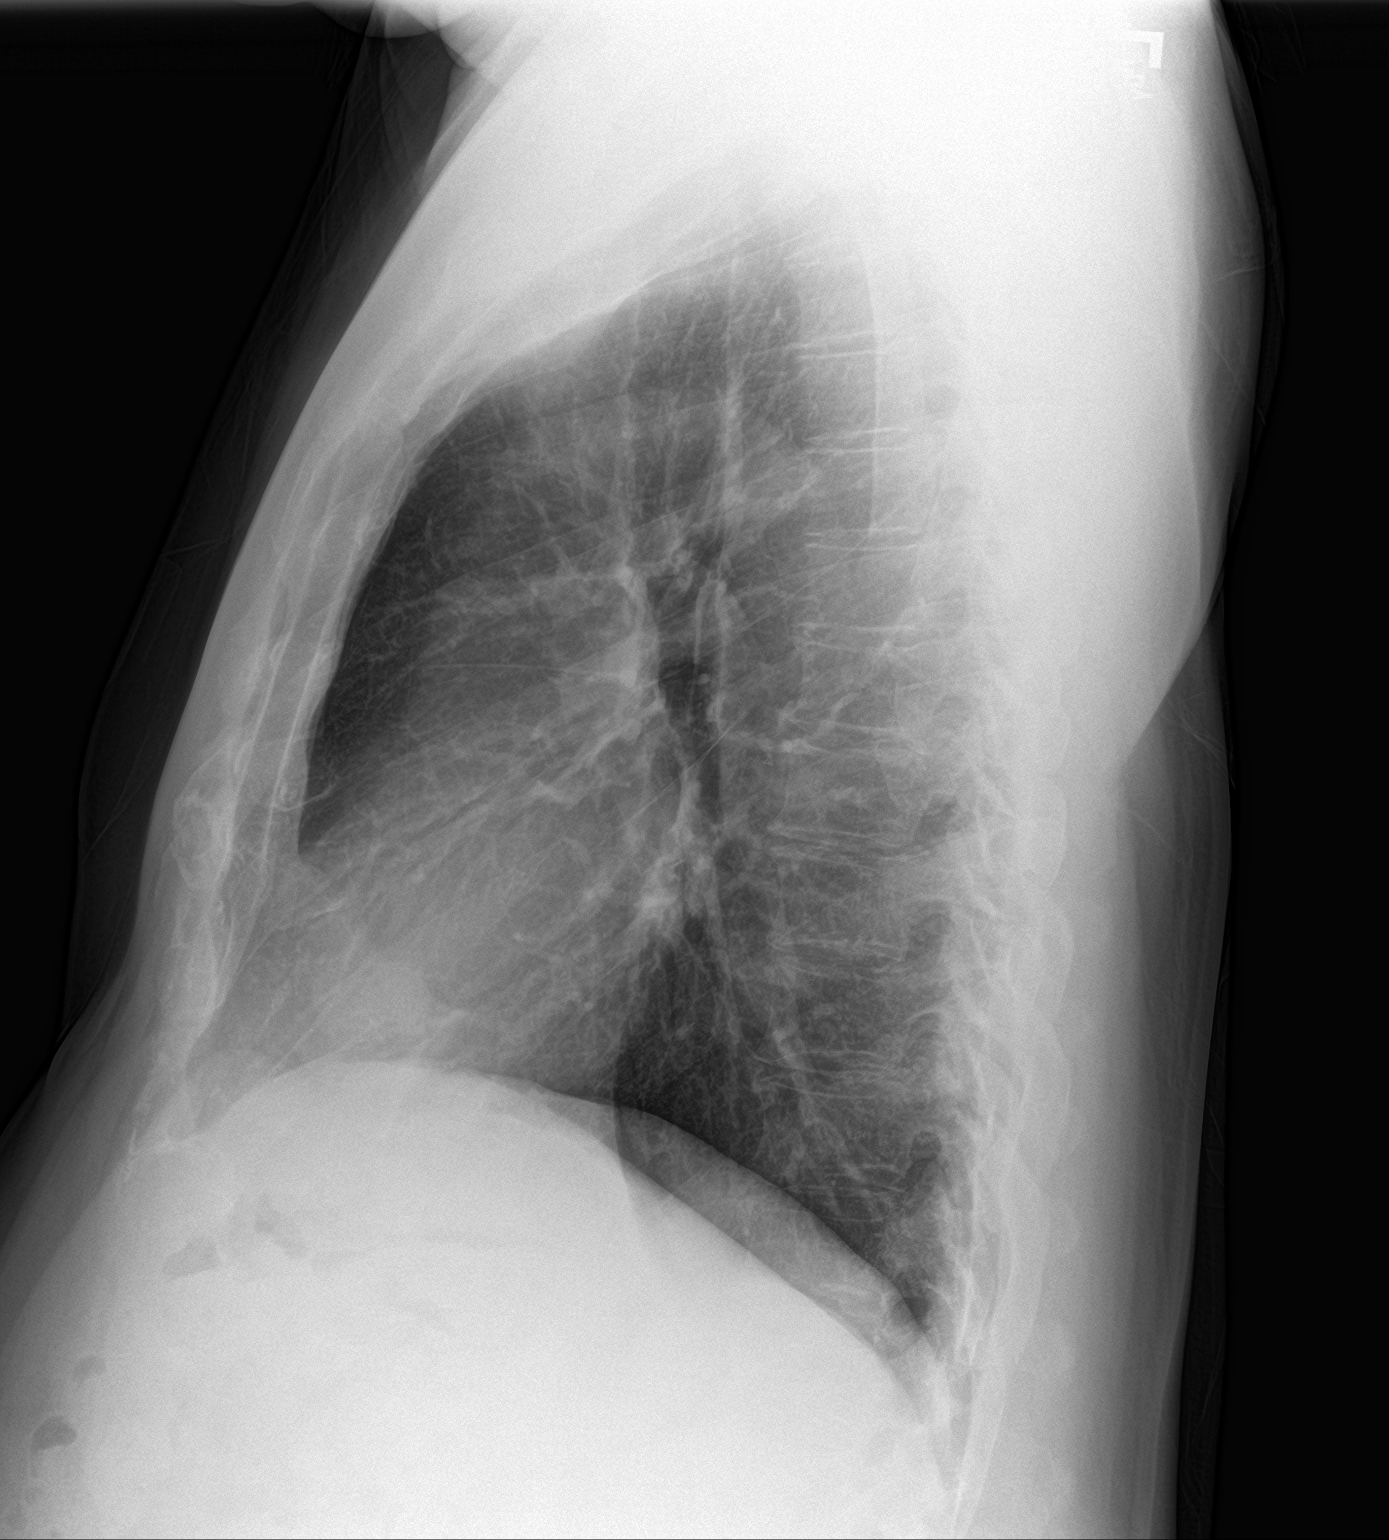

[2 of 2 positions shown; findings below may reference images not displayed]

FINDINGS: The heart size and mediastinal contours are within normal limits.
Both lungs are clear. No pneumothorax or pleural effusion is noted.
The visualized skeletal structures are unremarkable.
IMPRESSION: No active cardiopulmonary disease.

## 2020-09-09 ENCOUNTER — Emergency Department: Payer: PPO

## 2020-09-09 ENCOUNTER — Observation Stay: Payer: PPO

## 2020-09-09 ENCOUNTER — Other Ambulatory Visit: Payer: Self-pay

## 2020-09-09 ENCOUNTER — Inpatient Hospital Stay
Admission: EM | Admit: 2020-09-09 | Discharge: 2020-09-13 | DRG: 066 | Disposition: A | Discharge status: Home / Self Care | Payer: PPO | Attending: Internal Medicine | Admitting: Internal Medicine

## 2020-09-09 DIAGNOSIS — C61 Malignant neoplasm of prostate: Secondary | ICD-10-CM | POA: Diagnosis present

## 2020-09-09 DIAGNOSIS — R29818 Other symptoms and signs involving the nervous system: Secondary | ICD-10-CM | POA: Diagnosis not present

## 2020-09-09 DIAGNOSIS — K219 Gastro-esophageal reflux disease without esophagitis: Secondary | ICD-10-CM | POA: Diagnosis not present

## 2020-09-09 DIAGNOSIS — J3489 Other specified disorders of nose and nasal sinuses: Secondary | ICD-10-CM | POA: Diagnosis not present

## 2020-09-09 DIAGNOSIS — I639 Cerebral infarction, unspecified: Principal | ICD-10-CM | POA: Diagnosis present

## 2020-09-09 DIAGNOSIS — Z820 Family history of epilepsy and other diseases of the nervous system: Secondary | ICD-10-CM

## 2020-09-09 DIAGNOSIS — Z79899 Other long term (current) drug therapy: Secondary | ICD-10-CM | POA: Diagnosis not present

## 2020-09-09 DIAGNOSIS — G4451 Hemicrania continua: Secondary | ICD-10-CM

## 2020-09-09 DIAGNOSIS — M1990 Unspecified osteoarthritis, unspecified site: Secondary | ICD-10-CM | POA: Diagnosis present

## 2020-09-09 DIAGNOSIS — I63411 Cerebral infarction due to embolism of right middle cerebral artery: Secondary | ICD-10-CM | POA: Diagnosis not present

## 2020-09-09 DIAGNOSIS — Z884 Allergy status to anesthetic agent status: Secondary | ICD-10-CM

## 2020-09-09 DIAGNOSIS — Z20822 Contact with and (suspected) exposure to covid-19: Secondary | ICD-10-CM | POA: Diagnosis not present

## 2020-09-09 DIAGNOSIS — G44009 Cluster headache syndrome, unspecified, not intractable: Secondary | ICD-10-CM | POA: Diagnosis present

## 2020-09-09 DIAGNOSIS — G44001 Cluster headache syndrome, unspecified, intractable: Secondary | ICD-10-CM | POA: Diagnosis not present

## 2020-09-09 DIAGNOSIS — J45909 Unspecified asthma, uncomplicated: Secondary | ICD-10-CM | POA: Diagnosis present

## 2020-09-09 DIAGNOSIS — Z87891 Personal history of nicotine dependence: Secondary | ICD-10-CM | POA: Diagnosis not present

## 2020-09-09 DIAGNOSIS — I7771 Dissection of carotid artery: Secondary | ICD-10-CM | POA: Diagnosis not present

## 2020-09-09 DIAGNOSIS — E876 Hypokalemia: Secondary | ICD-10-CM | POA: Diagnosis not present

## 2020-09-09 DIAGNOSIS — R29700 NIHSS score 0: Secondary | ICD-10-CM | POA: Diagnosis present

## 2020-09-09 DIAGNOSIS — E785 Hyperlipidemia, unspecified: Secondary | ICD-10-CM | POA: Diagnosis not present

## 2020-09-09 DIAGNOSIS — I6611 Occlusion and stenosis of right anterior cerebral artery: Secondary | ICD-10-CM | POA: Diagnosis not present

## 2020-09-09 DIAGNOSIS — I509 Heart failure, unspecified: Secondary | ICD-10-CM | POA: Diagnosis not present

## 2020-09-09 DIAGNOSIS — G936 Cerebral edema: Secondary | ICD-10-CM | POA: Diagnosis not present

## 2020-09-09 DIAGNOSIS — R519 Headache, unspecified: Secondary | ICD-10-CM | POA: Diagnosis not present

## 2020-09-09 DIAGNOSIS — G450 Vertebro-basilar artery syndrome: Secondary | ICD-10-CM | POA: Diagnosis not present

## 2020-09-09 DIAGNOSIS — Z8546 Personal history of malignant neoplasm of prostate: Secondary | ICD-10-CM | POA: Diagnosis not present

## 2020-09-09 LAB — DIFFERENTIAL
Abs Immature Granulocytes: 0.03 10*3/uL (ref 0.00–0.07)
Basophils Absolute: 0 10*3/uL (ref 0.0–0.1)
Basophils Relative: 0 %
Eosinophils Absolute: 0 10*3/uL (ref 0.0–0.5)
Eosinophils Relative: 0 %
Immature Granulocytes: 0 %
Lymphocytes Relative: 9 %
Lymphs Abs: 0.7 10*3/uL (ref 0.7–4.0)
Monocytes Absolute: 0.4 10*3/uL (ref 0.1–1.0)
Monocytes Relative: 5 %
Neutro Abs: 6.7 10*3/uL (ref 1.7–7.7)
Neutrophils Relative %: 86 %

## 2020-09-09 LAB — COMPREHENSIVE METABOLIC PANEL
ALT: 21 U/L (ref 0–44)
AST: 21 U/L (ref 15–41)
Albumin: 4 g/dL (ref 3.5–5.0)
Alkaline Phosphatase: 63 U/L (ref 38–126)
Anion gap: 9 (ref 5–15)
BUN: 12 mg/dL (ref 8–23)
CO2: 26 mmol/L (ref 22–32)
Calcium: 9.2 mg/dL (ref 8.9–10.3)
Chloride: 103 mmol/L (ref 98–111)
Creatinine, Ser: 0.92 mg/dL (ref 0.61–1.24)
GFR, Estimated: >60 mL/min (ref >60)
Glucose, Bld: 128 mg/dL — ABNORMAL HIGH (ref 70–99)
Potassium: 3.6 mmol/L (ref 3.5–5.1)
Sodium: 138 mmol/L (ref 135–145)
Total Bilirubin: 0.9 mg/dL (ref 0.3–1.2)
Total Protein: 7.5 g/dL (ref 6.5–8.1)

## 2020-09-09 LAB — CBC
HCT: 40.2 % (ref 39.0–52.0)
Hemoglobin: 13.3 g/dL (ref 13.0–17.0)
MCH: 24.5 pg — ABNORMAL LOW (ref 26.0–34.0)
MCHC: 33.1 g/dL (ref 30.0–36.0)
MCV: 74 fL — ABNORMAL LOW (ref 80.0–100.0)
Platelets: 184 10*3/uL (ref 150–400)
RBC: 5.43 MIL/uL (ref 4.22–5.81)
RDW: 13.4 % (ref 11.5–15.5)
WBC: 7.8 10*3/uL (ref 4.0–10.5)
nRBC: 0.3 % — ABNORMAL HIGH (ref 0.0–0.2)

## 2020-09-09 LAB — RESP PANEL BY RT-PCR (FLU A&B, COVID) ARPGX2
Influenza A by PCR: NEGATIVE
Influenza B by PCR: NEGATIVE
SARS Coronavirus 2 by RT PCR: NEGATIVE

## 2020-09-09 LAB — APTT: aPTT: 25 seconds (ref 24–36)

## 2020-09-09 LAB — PROTIME-INR
INR: 0.9 (ref 0.8–1.2)
Prothrombin Time: 12.6 seconds (ref 11.4–15.2)

## 2020-09-09 MED ORDER — LORAZEPAM 2 MG/ML IJ SOLN
1.0000 mg | INTRAVENOUS | Status: DC | PRN
  Filled 2020-09-09: qty 0.5

## 2020-09-09 MED ORDER — SODIUM CHLORIDE 0.9 % IV BOLUS
1000.0000 mL | Freq: Once | INTRAVENOUS | Status: AC
  Administered 2020-09-09: 1000 mL via INTRAVENOUS

## 2020-09-09 MED ORDER — PANTOPRAZOLE SODIUM 40 MG PO TBEC
40.0000 mg | DELAYED_RELEASE_TABLET | Freq: Every day | ORAL | Status: DC
  Administered 2020-09-09 – 2020-09-13 (×5): 40 mg via ORAL
  Filled 2020-09-09 (×5): qty 1

## 2020-09-09 MED ORDER — ASPIRIN 325 MG PO TABS
325.0000 mg | ORAL_TABLET | Freq: Every day | ORAL | Status: DC
  Administered 2020-09-09: 325 mg via ORAL
  Filled 2020-09-09: qty 1

## 2020-09-09 MED ORDER — STROKE: EARLY STAGES OF RECOVERY BOOK: Freq: Once | Status: AC

## 2020-09-09 MED ORDER — TRIAMCINOLONE ACETONIDE 55 MCG/ACT NA AERO
1.0000 | INHALATION_SPRAY | Freq: Every day | NASAL | Status: DC
  Administered 2020-09-10 – 2020-09-13 (×4): 1 via NASAL
  Filled 2020-09-09: qty 10.8

## 2020-09-09 MED ORDER — IOHEXOL 350 MG/ML SOLN
75.0000 mL | Freq: Once | INTRAVENOUS | Status: AC | PRN
  Administered 2020-09-09: 75 mL via INTRAVENOUS

## 2020-09-09 MED ORDER — INDOMETHACIN 25 MG PO CAPS
25.0000 mg | ORAL_CAPSULE | Freq: Two times a day (BID) | ORAL | Status: DC
  Filled 2020-09-09 (×2): qty 1

## 2020-09-09 MED ORDER — FLUORESCEIN SODIUM 1 MG OP STRP: 1.0000 | ORAL_STRIP | Freq: Once | OPHTHALMIC | Status: DC

## 2020-09-09 MED ORDER — ATORVASTATIN CALCIUM 20 MG PO TABS
40.0000 mg | ORAL_TABLET | Freq: Every day | ORAL | Status: DC
  Administered 2020-09-09 – 2020-09-10 (×2): 40 mg via ORAL
  Filled 2020-09-09 (×2): qty 2

## 2020-09-09 MED ORDER — SODIUM CHLORIDE 0.9% FLUSH: 3.0000 mL | Freq: Once | INTRAVENOUS | Status: DC

## 2020-09-09 MED ORDER — HEPARIN (PORCINE) 25000 UT/250ML-% IV SOLN
1100.0000 [IU]/h | INTRAVENOUS | Status: DC
  Administered 2020-09-09: 1350 [IU]/h via INTRAVENOUS
  Administered 2020-09-10: 1300 [IU]/h via INTRAVENOUS
  Filled 2020-09-09 (×2): qty 250

## 2020-09-09 MED ORDER — ASPIRIN 300 MG RE SUPP: 300.0000 mg | Freq: Every day | RECTAL | Status: DC

## 2020-09-09 MED ORDER — PROCHLORPERAZINE EDISYLATE 10 MG/2ML IJ SOLN
10.0000 mg | Freq: Once | INTRAMUSCULAR | Status: AC
  Administered 2020-09-09: 10 mg via INTRAVENOUS
  Filled 2020-09-09: qty 2

## 2020-09-09 MED ORDER — GADOBUTROL 1 MMOL/ML IV SOLN
9.0000 mL | Freq: Once | INTRAVENOUS | Status: AC | PRN
  Administered 2020-09-09: 9 mL via INTRAVENOUS

## 2020-09-09 MED ORDER — HEPARIN BOLUS VIA INFUSION
5900.0000 [IU] | Freq: Once | INTRAVENOUS | Status: DC
  Filled 2020-09-09: qty 5900

## 2020-09-09 MED ORDER — MONTELUKAST SODIUM 10 MG PO TABS
10.0000 mg | ORAL_TABLET | Freq: Every day | ORAL | Status: DC
  Administered 2020-09-09 – 2020-09-12 (×4): 10 mg via ORAL
  Filled 2020-09-09 (×4): qty 1

## 2020-09-09 MED ORDER — ACETAMINOPHEN 650 MG RE SUPP: 650.0000 mg | RECTAL | Status: DC | PRN

## 2020-09-09 MED ORDER — ASPIRIN 81 MG PO CHEW
81.0000 mg | CHEWABLE_TABLET | Freq: Every day | ORAL | Status: DC
  Administered 2020-09-10 – 2020-09-13 (×4): 81 mg via ORAL
  Filled 2020-09-09 (×4): qty 1

## 2020-09-09 MED ORDER — LORAZEPAM 2 MG/ML IJ SOLN
1.0000 mg | INTRAMUSCULAR | Status: AC | PRN
  Administered 2020-09-09: 1 mg via INTRAVENOUS
  Filled 2020-09-09: qty 1

## 2020-09-09 MED ORDER — ACETAMINOPHEN 160 MG/5ML PO SOLN
650.0000 mg | ORAL | Status: DC | PRN
  Filled 2020-09-09: qty 20.3

## 2020-09-09 MED ORDER — TETRACAINE HCL 0.5 % OP SOLN
2.0000 [drp] | Freq: Once | OPHTHALMIC | Status: DC
  Filled 2020-09-09: qty 4

## 2020-09-09 MED ORDER — INDOMETHACIN 25 MG PO CAPS
25.0000 mg | ORAL_CAPSULE | Freq: Once | ORAL | Status: AC
  Administered 2020-09-09: 25 mg via ORAL
  Filled 2020-09-09: qty 1

## 2020-09-09 MED ORDER — ACETAMINOPHEN 325 MG PO TABS
650.0000 mg | ORAL_TABLET | ORAL | Status: DC | PRN
  Administered 2020-09-09 – 2020-09-11 (×7): 650 mg via ORAL
  Filled 2020-09-09 (×7): qty 2

## 2020-09-09 NOTE — ED Provider Notes (Signed)
[facility] Emergency Department Provider Note  ____________________________________________   I have reviewed the triage vital signs and the nursing notes.   HISTORY  Chief Complaint Headache   History limited by: Not Limited   HPI William Hicks is a 70 y.o. male who presents to the emergency department today because of concerns for headache and vision change.  Patient states his symptoms started yesterday.  This started after he walked back inside from doing some work in his yard.  Patient stated sudden onset of severe right-sided headache.  This was accompanied by blurry vision as well as dizziness.  Today the blurry vision and dizziness has seemed to improved although he is continued to have severe pain to the right side of his head.  He denies similar symptoms in the past.  He denies any fevers.  No recent trauma.  Records reviewed. Per medical record review patient has a history of prostate cancer, asthma, gerd.  Past Medical History:  Diagnosis Date  . Arthritis   . Asthma   . Epistaxis   . GERD (gastroesophageal reflux disease)   . Kidney stones   . Prostate cancer ([place])   . Sinusitis     Patient Active Problem List   Diagnosis Date Noted  . Malignant neoplasm of prostate ([place]) 05/06/2015  . Degenerative arthritis of hip 11/14/2013  . HLD (hyperlipidemia) 09/25/2012  . GERD (gastroesophageal reflux disease) 08/17/2011  . Abscess, gluteal, right 12/22/2010  . Abscess, gluteal 12/18/2010  . HEARTBURN 10/30/2008  . PERSONAL HISTORY OF COLONIC POLYPS 10/30/2008    Past Surgical History:  Procedure Laterality Date  . LITHOTRIPSY  2008    Prior to Admission medications   Medication Sig Start Date End Date Taking? Authorizing Provider  albuterol (PROAIR HFA) 108 (90 Base) MCG/ACT inhaler Inhale into the lungs.    [provider]  fluticasone (FLONASE) 50 MCG/ACT nasal spray Place into the nose.    [provider]   montelukast (SINGULAIR) 10 MG tablet Take 1 tablet (10 mg total) by mouth at bedtime. 12/28/16   Kozlow, Donnamarie Poag, MD  omeprazole (PRILOSEC) 40 MG capsule Take 40 mg by mouth daily.    [provider]  triamcinolone (NASACORT ALLERGY 24HR) 55 MCG/ACT AERO nasal inhaler Place 1 spray into the nose daily.    [provider]  fluticasone (VERAMYST) 27.5 MCG/SPRAY nasal spray Place 2 sprays into the nose daily.    08/17/11  [provider]    Allergies Aspirin  Family History  Problem Relation Age of Onset  . Alzheimer's disease Father     Social History Social History   Tobacco Use  . Smoking status: Former Research scientist (life sciences)  . Smokeless tobacco: Never Used  Substance Use Topics  . Alcohol use: Yes  . Drug use: No    Review of Systems Constitutional: No fever/chills Eyes: Positive for vision change.  ENT: No sore throat. Cardiovascular: Denies chest pain. Respiratory: Denies shortness of breath. Gastrointestinal: No abdominal pain.  No nausea, no vomiting.  No diarrhea.   Genitourinary: Negative for dysuria. Musculoskeletal: Negative for back pain. Skin: Negative for rash. Neurological: Positive for headache and dizziness.  ____________________________________________   PHYSICAL EXAM:  VITAL SIGNS: ED Triage Vitals  Enc Vitals Group     BP 09/09/20 1350 (!) 156/85     Pulse Rate 09/09/20 1350 90     Resp 09/09/20 1350 18     Temp 09/09/20 1350 98.5 F (36.9 C)     Temp Source  09/09/20 1350 Oral     SpO2 09/09/20 1350 100 %     Weight 09/09/20 1351 215 lb (97.5 kg)     Height 09/09/20 1351 6' (1.829 m)     Head Circumference --      Peak Flow --      Pain Score 09/09/20 1351 10   Constitutional: Alert and oriented.  Eyes: Conjunctivae are normal.  ENT      Head: Normocephalic and atraumatic.      Nose: No congestion/rhinnorhea.      Mouth/Throat: Mucous membranes are moist.      Neck: No stridor. Hematological/Lymphatic/Immunilogical: No  cervical lymphadenopathy. Cardiovascular: Normal rate, regular rhythm.  No murmurs, rubs, or gallops.  Respiratory: Normal respiratory effort without tachypnea nor retractions. Breath sounds are clear and equal bilaterally. No wheezes/rales/rhonchi. Gastrointestinal: Soft and non tender. No rebound. No guarding.  Genitourinary: Deferred Musculoskeletal: Normal range of motion in all extremities. No lower extremity edema. Neurologic:  Normal speech and language. No gross focal neurologic deficits are appreciated.  Skin:  Skin is warm, dry and intact. No rash noted. Psychiatric: Mood and affect are normal. Speech and behavior are normal. Patient exhibits appropriate insight and judgment.  ____________________________________________    LABS (pertinent positives/negatives)  CMP wnl except glu 128 CBC wbc 7.8, hgb 13.3, plt 184  ____________________________________________   EKG  I, Nance Pear, attending physician, personally viewed and interpreted this EKG  EKG Time: 1352 Rate: 85 Rhythm: normal sinus rhythm Axis: normal Intervals: qtc 449 QRS: narrow ST changes: no st elevation Impression: normal ekg  ____________________________________________    RADIOLOGY  CT head Concern for acute vs subacute stroke in right parietal lobe  ____________________________________________   PROCEDURES  Procedures  ____________________________________________   INITIAL IMPRESSION / ASSESSMENT AND PLAN / ED COURSE  Pertinent labs & imaging results that were available during my care of the patient were reviewed by me and considered in my medical decision making (see chart for details).   Patient presented to the emergency department today because of concern for right sided headache, vision change and dizziness. At the time of my exam patient vision and dizziness had improved. CT head is concerning for CVA. Discussed this with the patient. Will plan on admission to the  hospitalist service.   ____________________________________________   FINAL CLINICAL IMPRESSION(S) / ED DIAGNOSES  Final diagnoses:  Cerebrovascular accident (CVA), unspecified mechanism ([place])     Note: This dictation was prepared with Dragon dictation. Any transcriptional errors that result from this process are unintentional     Nance Pear, MD 09/09/20 1501

## 2020-09-09 NOTE — ED Notes (Signed)
Pt presents to ED with c/o of a R sided headache that started yesterday. Pt denies HX of migraines. Pt states intermittent moments yesterday of unsteady gait but denies falling. Pt states an episode of blurred vision but states that has resolved but still has the headache. Pt denies injury or trauma to his head or eye. Pt is A&Ox4. NAD noted.

## 2020-09-09 NOTE — ED Notes (Signed)
Pt at MRI

## 2020-09-09 NOTE — Progress Notes (Signed)
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 While indeterminate in etiology, findings could potentially be secondary to dissection given the patient's relative lack of atherosclerosis elsewhere. Catheter arteriogram could further characterize if clinically indicated. Patient has been started on IV heparin per neurology Will change patient's admission status and upgrade to step down Neuro checks q 2 hours

## 2020-09-09 NOTE — Consult Note (Signed)
ANTICOAGULATION CONSULT NOTE - Initial Consult  Pharmacy Consult for heparin infusion Indication: stroke  Allergies  Allergen Reactions  . Aspirin Diarrhea and Nausea And Vomiting    Patient Measurements: Height: 6' (182.9 cm) Weight: 97.5 kg (215 lb) IBW/kg (Calculated) : 77.6 Heparin Dosing Weight: 97.5 kg   Labs: Recent Labs    09/09/20 1354  HGB 13.3  HCT 40.2  PLT 184  APTT 25  LABPROT 12.6  INR 0.9  CREATININE 0.92    Estimated Creatinine Clearance: 94.3 mL/min (by C-G formula based on SCr of 0.92 mg/dL).   Medical History: Past Medical History:  Diagnosis Date  . Arthritis   . Asthma   . Epistaxis   . GERD (gastroesophageal reflux disease)   . Kidney stones   . Prostate cancer ([place])   . Sinusitis     Medications:  No prior anticoagulation noted  Assessment: 69 presented to the emergency room for evaluation of right-sided headache. On Findings of CTA of head/neck concerning for acute CVA, concerning for intraluminal thombus/dissection. Pharmacy has been consulted for initiation and management of heparin infusion via stroke protocol.   Goal of Therapy:  Heparin level 0.3-0.5 units/ml Monitor platelets by anticoagulation protocol: Yes   Plan:   Following CVA protocol for heparin infusion, will initiated heparin infusion at 1350 units/hr with no bolus  Check HL 6 hours following infusion start time  Targeting lower end of heparin therapeutic range: 0.3-0.5   CBC daily  Per neurology: plan to continue heparin infusion 2-3 days  Dorothe Pea, PharmD, BCPS 09/09/2020,6:52 PM

## 2020-09-09 NOTE — ED Notes (Signed)
Family updated as to patient's status. Wife at bedside 

## 2020-09-09 NOTE — H&P (Addendum)
History and Physical    William Hicks WCH:[id number] DOB: July 08, 1950 DOA: 09/09/2020  PCP: Maude Leriche, PA-C   Patient coming from: Home  I have personally briefly reviewed patient's old medical records in [place]  Chief Complaint: Headache  HPI: William Hicks is a 71 y.o. male with medical history significant for prostate cancer, GERD, sinusitis who presents to the ER for evaluation of headache.  Patient states that he was in his usual state of health until yesterday evening while doing some yard work he developed sudden onset severe right-sided headache Which he rated a 10 x 10 in intensity at its worst and was associated with blurred vision.  Patient states he was unable to see out of his left eye. Headache is associated with nausea but no vomiting. He has never had headaches like this in the past and denies having any trauma.. He denies having any fever, no chills, no chest pain, no shortness of breath, no abdominal pain, no urinary symptoms, no changes in his bowel habits, no focal deficits, no cough, no difficulty swallowing Labs show sodium 138, potassium 3.6, chloride 103, bicarb 26, glucose 128, BUN 12, creatinine 0.92, calcium 9.2, alkaline phosphatase 63, albumin 4.0, AST 21, ALT 21, total protein 0.9, white count 7.8, hemoglobin 13.3, hematocrit 40.2, MCV 74, RDW 13.4, platelet count 184, PT 12.6, INR 0.9, CT scan of the head without contrast shows cortically based hypodensity in the high right parietal lobe. This has the appearance of acute or subacute infarct. No acute hemorrhage. Twelve-lead EKG reviewed by me shows normal sinus rhythm with nonspecific ST changes.     ED Course: Patient is a 70 year old African-American male who presents to the ER for evaluation of sudden onset right-sided headache associated with vision changes in his left eye.  Patient states he was unable to see out of his left eye and could not see partially from his right eye.   He states that his vision is better but the headache has persisted. Headache is mostly over the right temporal and frontal area extending down the right side of his face.  Patient has conjunctival injection and increased lacrimation. CT scan of the head without contrast showed hypodensity in the high right parietal lobe which has the appearance of acute or subacute infarct.  Patient will be referred to observation status for further evaluation.  Review of Systems: As per HPI otherwise all other systems reviewed and negative.    Past Medical History:  Diagnosis Date  . Arthritis   . Asthma   . Epistaxis   . GERD (gastroesophageal reflux disease)   . Kidney stones   . Prostate cancer ([place])   . Sinusitis     Past Surgical History:  Procedure Laterality Date  . LITHOTRIPSY  2008     reports that he has quit smoking. He has never used smokeless tobacco. He reports current alcohol use. He reports that he does not use drugs.  Allergies  Allergen Reactions  . Aspirin Diarrhea and Nausea And Vomiting    Family History  Problem Relation Age of Onset  . Alzheimer's disease Father       Prior to Admission medications   Medication Sig Start Date End Date Taking? Authorizing Provider  albuterol (VENTOLIN HFA) 108 (90 Base) MCG/ACT inhaler Inhale into the lungs.   Yes [provider]  fluticasone (FLONASE) 50 MCG/ACT nasal spray Place into the nose.   Yes [provider]  montelukast (SINGULAIR) 10 MG tablet Take 1  tablet (10 mg total) by mouth at bedtime. Patient not taking: Reported on 09/09/2020 12/28/16   Jiles Prows, MD  omeprazole (PRILOSEC) 40 MG capsule Take 40 mg by mouth daily. Patient not taking: Reported on 09/09/2020    [provider]  triamcinolone (NASACORT) 55 MCG/ACT AERO nasal inhaler Place 1 spray into the nose daily. Patient not taking: Reported on 09/09/2020    [provider]  fluticasone (VERAMYST) 27.5 MCG/SPRAY nasal spray  Place 2 sprays into the nose daily.    08/17/11  [provider]    Physical Exam: Vitals:   09/09/20 1350 09/09/20 1351  BP: (!) 156/85   Pulse: 90   Resp: 18   Temp: 98.5 F (36.9 C)   TempSrc: Oral   SpO2: 100%   Weight:  97.5 kg  Height:  6' (1.829 m)     Vitals:   09/09/20 1350 09/09/20 1351  BP: (!) 156/85   Pulse: 90   Resp: 18   Temp: 98.5 F (36.9 C)   TempSrc: Oral   SpO2: 100%   Weight:  97.5 kg  Height:  6' (1.829 m)      Constitutional: Alert and oriented x 3 .  Appears uncomfortable and in painful distress from severe right-sided headache.  Patient very tearful  HEENT:      Head: Normocephalic and atraumatic.         Eyes: PERLA, EOMI, Conjunctival injection. Sclera is non-icteric.       Mouth/Throat: Mucous membranes are moist.       Neck: Supple with no signs of meningismus. Cardiovascular: Regular rate and rhythm. No murmurs, gallops, or rubs. 2+ symmetrical distal pulses are present . No JVD. No LE edema Respiratory: Respiratory effort normal .Lungs sounds clear bilaterally. No wheezes, crackles, or rhonchi.  Gastrointestinal: Soft, non tender, and non distended with positive bowel sounds.  Genitourinary: No CVA tenderness. Musculoskeletal: Nontender with normal range of motion in all extremities. No cyanosis, or erythema of extremities. Neurologic:  Face is symmetric. Moving all extremities. No gross focal neurologic deficits  Skin: Skin is warm, dry.  No rash or ulcers Psychiatric: Mood and affect are normal   Labs on Admission: I have personally reviewed following labs and imaging studies  CBC: Recent Labs  Lab 09/09/20 1354  WBC 7.8  NEUTROABS 6.7  HGB 13.3  HCT 40.2  MCV 74.0*  PLT 712   Basic Metabolic Panel: Recent Labs  Lab 09/09/20 1354  NA 138  K 3.6  CL 103  CO2 26  GLUCOSE 128*  BUN 12  CREATININE 0.92  CALCIUM 9.2   GFR: Estimated Creatinine Clearance: 94.3 mL/min (by C-G formula based on SCr of 0.92  mg/dL). Liver Function Tests: Recent Labs  Lab 09/09/20 1354  AST 21  ALT 21  ALKPHOS 63  BILITOT 0.9  PROT 7.5  ALBUMIN 4.0   No results for input(s): LIPASE, AMYLASE in the last 168 hours. No results for input(s): AMMONIA in the last 168 hours. Coagulation Profile: Recent Labs  Lab 09/09/20 1354  INR 0.9   Cardiac Enzymes: No results for input(s): CKTOTAL, CKMB, CKMBINDEX, TROPONINI in the last 168 hours. BNP (last 3 results) No results for input(s): PROBNP in the last 8760 hours. HbA1C: No results for input(s): HGBA1C in the last 72 hours. CBG: No results for input(s): GLUCAP in the last 168 hours. Lipid Profile: No results for input(s): CHOL, HDL, LDLCALC, TRIG, CHOLHDL, LDLDIRECT in the last 72 hours. Thyroid Function Tests: No  results for input(s): TSH, T4TOTAL, FREET4, T3FREE, THYROIDAB in the last 72 hours. Anemia Panel: No results for input(s): VITAMINB12, FOLATE, FERRITIN, TIBC, IRON, RETICCTPCT in the last 72 hours. Urine analysis: No results found for: COLORURINE, APPEARANCEUR, LABSPEC, [place], GLUCOSEU, HGBUR, BILIRUBINUR, KETONESUR, PROTEINUR, UROBILINOGEN, NITRITE, LEUKOCYTESUR  Radiological Exams on Admission: CT HEAD WO CONTRAST  Result Date: 09/09/2020 CLINICAL DATA:  Acute neuro deficit. EXAM: CT HEAD WITHOUT CONTRAST TECHNIQUE: Contiguous axial images were obtained from the base of the skull through the vertex without intravenous contrast. COMPARISON:  CT head 04/29/2010 FINDINGS: Brain: Cortically based hypodensity in the high right parietal lobe is ill-defined and appears acute. This was not present previously. No other areas of abnormality. No hemorrhage. Ventricle size normal. Vascular: Negative for hyperdense vessel Skull: Negative Sinuses/Orbits: Mucosal edema paranasal sinuses.  No orbital lesion. Other: None IMPRESSION: Cortically based hypodensity in the high right parietal lobe. This has the appearance of acute or subacute infarct. No acute  hemorrhage. These results were called by telephone at the time of interpretation on 09/09/2020 at 2:36 pm to provider [facility] , who verbally acknowledged these results. Electronically Signed   By: Franchot Gallo M.D.   On: 09/09/2020 14:36     Assessment/Plan Principal Problem:   CVA (cerebral vascular accident) [facility]) Active Problems:   GERD (gastroesophageal reflux disease)   Malignant neoplasm of prostate ([place])   Cluster headache    CVA Patient presents to the ER for evaluation of sudden onset headache associated with decreased vision in the left eye and nausea. He has no focal deficits CT scan of the head without contrast shows cortically based hypodensity in the high right parietal lobe. This has the appearance of acute or subacute infarct. No acute hemorrhage. Will obtain MRI of the brain without contrast We will obtain 2D echocardiogram to assess LVEF and rule out cardiac thrombus We will request PT/OT/ST evaluation Place patient on aspirin and high intensity statin Allow for permissive hypertension     ?? 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 daily   DVT prophylaxis: SCD Code Status: full code Family Communication: Greater than than 50% of time was spent discussing patient's condition and plan of care with him and his wife at the bedside.  All questions and concerns have been addressed.  They verbalized understanding and agree with the plan. Disposition Plan: Back to previous home environment Consults called: Neurology Status:Observation    Collier Bullock MD [facility]     09/09/2020, 4:07 PM

## 2020-09-09 NOTE — ED Triage Notes (Signed)
Pt sent from [facility] with c/o severe to the right side of face with blurred vision, denies any numbness. States he was having issues with his balance yesterday, states the vision and balance issues has improved. Pt also c/o having nausea

## 2020-09-09 NOTE — Progress Notes (Signed)
OT Cancellation Note  Patient Details Name: LEDELL CODRINGTON MRN: [id number] DOB: 12-Mar-1951   Cancelled Treatment:    Reason Eval/Treat Not Completed: Patient at procedure or test/ unavailable. Order received, chart reviewed. Pt out of room for testing. Will re-attempt OT evaluation at later date/time as pt is available and medically appropriate.  Hanley Hays, MPH, MS, OTR/L ascom [phone] 09/09/20, 4:56 PM

## 2020-09-09 NOTE — ED Notes (Signed)
Pt placed on 12lpm NRB per MD request to help with oxygen. Neuro at bedside

## 2020-09-09 NOTE — Consult Note (Addendum)
Neurology Consultation  Reason for Consult: Stroke on CT scan, headaches Referring Physician: Dr. Francine Graven  CC: Right-sided headache, abnormal MRI  History is obtained from: Patient, chart  HPI: William Hicks is a 70 y.o. male past medical history of prostate cancer diagnosed many years ago in remission, asthma, arthritis, presented to the emergency room for evaluation of right-sided headache that started Sunday night. He reports that he started getting a pressure-like sensation headache over William right forehead and right cheek to the point that it made him cry.  He reports redness and drainage from both William eyes.  He describes the headache as a constant pressure with no remission.  He reports Tylenol and tramadol having helped some to take the edge off the pain but did not really help with the pain resolution. Denies any focal tingling numbness weakness. Denies prior history of headaches or migraines. Reports prior history of sinus headaches/sinus pressures constantly. Reports has been feeling a little sick with respiratory symptoms over the past few weeks but that happens every so often with him and William Hicks. Also reports itching in the right ear.   LKW: 2 days ago tpa given?: no, outside the window Premorbid modified Rankin scale (mRS): 0 ROS: Full ROS was performed and is negative except as noted in the HPI.   Past Medical History:  Diagnosis Date  . Arthritis   . Asthma   . Epistaxis   . GERD (gastroesophageal reflux disease)   . Kidney stones   . Prostate cancer ([place])   . Sinusitis     Family History  Problem Relation Age of Onset  . Alzheimer's disease Father    Social History:   reports that he has quit smoking. He has never used smokeless tobacco. He reports current alcohol use. He reports that he does not use drugs.  Medications  Current Facility-Administered Medications:  .   stroke: mapping our early stages of recovery book, , Does not apply, Once, Agbata,  Tochukwu, MD .  acetaminophen (TYLENOL) tablet 650 mg, 650 mg, Oral, Q4H PRN **OR** acetaminophen (TYLENOL) 160 MG/5ML solution 650 mg, 650 mg, Per Tube, Q4H PRN **OR** acetaminophen (TYLENOL) suppository 650 mg, 650 mg, Rectal, Q4H PRN, Agbata, Tochukwu, MD .  aspirin suppository 300 mg, 300 mg, Rectal, Daily **OR** aspirin tablet 325 mg, 325 mg, Oral, Daily, Agbata, Tochukwu, MD, 325 mg at 09/09/20 1550 .  atorvastatin (LIPITOR) tablet 40 mg, 40 mg, Oral, Daily, Agbata, Tochukwu, MD .  fluorescein ophthalmic strip 1 strip, 1 strip, Both Eyes, Once, Nance Pear, MD .  montelukast (SINGULAIR) tablet 10 mg, 10 mg, Oral, QHS, Agbata, Tochukwu, MD .  pantoprazole (PROTONIX) EC tablet 40 mg, 40 mg, Oral, Daily, Agbata, Tochukwu, MD .  sodium chloride flush (NS) 0.9 % injection 3 mL, 3 mL, Intravenous, Once, Nance Pear, MD .  tetracaine (PONTOCAINE) 0.5 % ophthalmic solution 2 drop, 2 drop, Both Eyes, Once, Nance Pear, MD .  triamcinolone (NASACORT) nasal inhaler 1 spray, 1 spray, Nasal, Daily, Agbata, Tochukwu, MD  Current Outpatient Medications:  .  albuterol (VENTOLIN HFA) 108 (90 Base) MCG/ACT inhaler, Inhale into the lungs., Disp: , Rfl:  .  atorvastatin (LIPITOR) 40 MG tablet, TAKE ONE TABLET BY MOUTH AT BEDTIME FOR CHOLESTEROL, Disp: , Rfl:  .  Baclofen 5 MG TABS, Take 1 tablet by mouth 3 (three) times daily., Disp: , Rfl:  .  Cholecalciferol 25 MCG (1000 UT) tablet, Take 1 tablet by mouth daily., Disp: , Rfl:  .  cromolyn (  OPTICROM) 4 % ophthalmic solution, 1 drop 4 (four) times daily., Disp: , Rfl:  .  fluticasone (FLONASE) 50 MCG/ACT nasal spray, Place into the nose., Disp: , Rfl:  .  ibuprofen (ADVIL) 800 MG tablet, ibuprofen 800 mg tablet  Take 1 tablet 3 times a day by oral route., Disp: , Rfl:  .  pantoprazole (PROTONIX) 40 MG tablet, TAKE ONE TABLET BY MOUTH EVERY DAY TO CONTROL STOMACH ACID, Disp: , Rfl:  .  pseudoephedrine (SUDAFED) 30 MG tablet, 2 tablets as needed,  Disp: , Rfl:  .  traMADol (ULTRAM) 50 MG tablet, tramadol 50 mg tablet, Disp: , Rfl:  .  montelukast (SINGULAIR) 10 MG tablet, Take 1 tablet (10 mg total) by mouth at bedtime. (Patient not taking: No sig reported), Disp: 90 tablet, Rfl: 2 .  omeprazole (PRILOSEC) 40 MG capsule, Take 40 mg by mouth daily. (Patient not taking: No sig reported), Disp: , Rfl:  .  triamcinolone (NASACORT) 55 MCG/ACT AERO nasal inhaler, Place 1 spray into the nose daily. (Patient not taking: No sig reported), Disp: , Rfl:    Exam: Current vital signs: BP (!) 156/85 (BP Location: Right Arm)   Pulse 90   Temp 98.5 F (36.9 C) (Oral)   Resp 18   Ht 6' (1.829 m)   Wt 97.5 kg   SpO2 100%   BMI 29.16 kg/m  Vital signs in last 24 hours: Temp:  [98.5 F (36.9 C)] 98.5 F (36.9 C) (04/19 1350) Pulse Rate:  [90] 90 (04/19 1350) Resp:  [18] 18 (04/19 1350) BP: (156)/(85) 156/85 (04/19 1350) SpO2:  [100 %] 100 % (04/19 1350) Weight:  [97.5 kg] 97.5 kg (04/19 1351)  GENERAL: Awake, alert in NAD HEENT: - Normocephalic and atraumatic, dry mm, no LN++, no Thyromegally LUNGS - Clear to auscultation bilaterally with no wheezes CV - S1S2 RRR, no m/r/g, equal pulses bilaterally. ABDOMEN - Soft, nontender, nondistended with normoactive BS Ext: warm, well perfused, intact peripheral pulses, no edema  NEURO:  Mental Status: AA&Ox3  Language: speech is clear.  Naming, repetition, fluency, and comprehension intact. Cranial Nerves: PERRL EOMI, visual fields full, no facial asymmetry, facial sensation intact, hearing intact, tongue/uvula/soft palate midline, normal sternocleidomastoid and trapezius muscle strength. No evidence of tongue atrophy or fibrillations Motor: 5/5 in all fours Tone: is normal and bulk is normal Sensation- Intact to light touch bilaterally Coordination: FTN intact bilaterally, no ataxia in BLE. Gait- deferred  NIHSS  0   Labs I have reviewed labs in epic and the results pertinent to this  consultation are:   CBC    Component Value Date/Time   WBC 7.8 09/09/2020 1354   RBC 5.43 09/09/2020 1354   HGB 13.3 09/09/2020 1354   HGB 12.9 (L) 12/28/2016 1446   HCT 40.2 09/09/2020 1354   HCT 39.3 12/28/2016 1446   PLT 184 09/09/2020 1354   PLT 171 12/28/2016 1446   MCV 74.0 (L) 09/09/2020 1354   MCV 74 (L) 12/28/2016 1446   MCH 24.5 (L) 09/09/2020 1354   MCHC 33.1 09/09/2020 1354   RDW 13.4 09/09/2020 1354   RDW 14.4 12/28/2016 1446   LYMPHSABS 0.7 09/09/2020 1354   LYMPHSABS 0.9 12/28/2016 1446   MONOABS 0.4 09/09/2020 1354   EOSABS 0.0 09/09/2020 1354   EOSABS 0.2 12/28/2016 1446   BASOSABS 0.0 09/09/2020 1354   BASOSABS 0.0 12/28/2016 1446    CMP     Component Value Date/Time   NA 138 09/09/2020 1354   K 3.6  09/09/2020 1354   CL 103 09/09/2020 1354   CO2 26 09/09/2020 1354   GLUCOSE 128 (H) 09/09/2020 1354   BUN 12 09/09/2020 1354   CREATININE 0.92 09/09/2020 1354   CALCIUM 9.2 09/09/2020 1354   PROT 7.5 09/09/2020 1354   ALBUMIN 4.0 09/09/2020 1354   AST 21 09/09/2020 1354   ALT 21 09/09/2020 1354   ALKPHOS 63 09/09/2020 1354   BILITOT 0.9 09/09/2020 1354   GFRNONAA >60 09/09/2020 1354   GFRAA >60 06/25/2018 1111   Imaging I have reviewed the images obtained:  CT-scan of the brain-question subacute/chronic high right parietal stroke.  Assessment: 70 year old man with above past medical history presenting for evaluation of 2 days of right-sided headache which primarily involves the right forehead and the right cheek.  Description does not completely fit a cluster headache type pattern.  Its a constant pressure-like sensation with palpable tenderness on William frontal and maxillary sinuses. Incidentally, William CT head shows a high right parietal subacute/chronic stroke-unclear of the significance but needs full stroke work-up including further imaging. My suspicion at this time about William headache is sinus headache versus dural venous sinus thrombosis given  the atypical location of the stroke seen on the CT head leading to constant pressure-like headache. Other etiologies such as a cluster headache can be a possibility but description of William headache as well as duration does not fit the cluster pattern.  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  CTA head and neck  Aspirin for now  Atorvastatin 80  A1c next lipid panel  2D echo  PT OT and speech therapy  N.p.o. until cleared by bedside swallow evaluation  No need for permissive hypertension as her symptoms have been at least 48 hours.   From the headache perspective:  Already been given Compazine.  I recommend Indomethacin 50 mg daily -highly effective for hemicrania continua  If continues to have headache despite that, might consider a toradol/reglan/benadryl combination  If the MRI of the brain shows any evidence of sinusitis, might need steroids and antibiotics.  Primary team is started patients on high-dose oxygen-I do not see harm in trying that and if that helps, it might guide [place] towards this being more of cluster headache.  Plan discussed with the primary hospitalist over secure chat. Plan discussed with the patient and William Hicks at bedside. Plan also discussed with the RN at bedside.  -- Amie Portland, MD Neurologist [facility] Pager: [phone]   ADDENDUM Got a call from radiology re: CTA head and neck. Reviewed images. IMPRESSION IMPRESSION: 1. 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 While indeterminate in etiology, findings could potentially be secondary to dissection given the patient's relative lack of atherosclerosis elsewhere. Catheter arteriogram could further characterize if clinically indicated. 2. Otherwise, no significant proximal stenosis in the head or neck.      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  If any neuro worsening, might need to send to [facility] for diagnostic angioand possible intervention although having stayed stable and free from objective neurological deficit is reassuring  If remains stable from a neuro exam perspective over next few days, will continue heparin drip for 2-3 days and then discharge home on DAPT with repeat CTA in 3 months.  Because he is going to be on a heparin drip, will hold off Indocin after 1 dose to reduce any extra risk for bleeding or hemorrhagic transformation of the subacute stroke.  Continue ASA 81 daily for now.  Relayed plan to hospitalist via secure chat/phone.  -- Amie Portland, MD Neurologist [facility] Pager: [phone]

## 2020-09-09 NOTE — ED Notes (Signed)
Pt at CT

## 2020-09-10 ENCOUNTER — Inpatient Hospital Stay
Admit: 2020-09-10 | Discharge: 2020-09-10 | Disposition: A | Discharge status: Home / Self Care | Payer: PPO | Attending: Internal Medicine | Admitting: Internal Medicine

## 2020-09-10 DIAGNOSIS — I7771 Dissection of carotid artery: Secondary | ICD-10-CM

## 2020-09-10 DIAGNOSIS — I63411 Cerebral infarction due to embolism of right middle cerebral artery: Secondary | ICD-10-CM

## 2020-09-10 LAB — HEPARIN LEVEL (UNFRACTIONATED)
Heparin Unfractionated: 0.38 IU/mL (ref 0.30–0.70)
Heparin Unfractionated: 0.52 IU/mL (ref 0.30–0.70)
Heparin Unfractionated: 0.49 IU/mL (ref 0.30–0.70)
Heparin Unfractionated: 0.7 IU/mL (ref 0.30–0.70)

## 2020-09-10 LAB — LIPID PANEL
Cholesterol: 256 mg/dL — ABNORMAL HIGH (ref 0–200)
HDL: 78 mg/dL (ref >40)
LDL Cholesterol: 154 mg/dL — ABNORMAL HIGH (ref 0–99)
Total CHOL/HDL Ratio: 3.3 RATIO
Triglycerides: 120 mg/dL (ref <150)
VLDL: 24 mg/dL (ref 0–40)

## 2020-09-10 LAB — HEMOGLOBIN A1C
Hgb A1c MFr Bld: 5.6 % (ref 4.8–5.6)
Mean Plasma Glucose: 114.02 mg/dL

## 2020-09-10 LAB — ECHOCARDIOGRAM COMPLETE
AR max vel: 2.64 cm2
AV Area VTI: 2.49 cm2
AV Area mean vel: 2.51 cm2
AV Mean grad: 4 mmHg
AV Peak grad: 6.9 mmHg
Ao pk vel: 1.31 m/s
Area-P 1/2: 3.81 cm2
Height: 72 in
MV VTI: 2.66 cm2
S' Lateral: 3.1 cm
Weight: 3435.65 oz

## 2020-09-10 LAB — CBC
HCT: 38.9 % — ABNORMAL LOW (ref 39.0–52.0)
Hemoglobin: 12.7 g/dL — ABNORMAL LOW (ref 13.0–17.0)
MCH: 24.7 pg — ABNORMAL LOW (ref 26.0–34.0)
MCHC: 32.6 g/dL (ref 30.0–36.0)
MCV: 75.7 fL — ABNORMAL LOW (ref 80.0–100.0)
Platelets: 172 10*3/uL (ref 150–400)
RBC: 5.14 MIL/uL (ref 4.22–5.81)
RDW: 13.5 % (ref 11.5–15.5)
WBC: 6.7 10*3/uL (ref 4.0–10.5)
nRBC: 0 % (ref 0.0–0.2)

## 2020-09-10 LAB — GLUCOSE, CAPILLARY: Glucose-Capillary: 109 mg/dL — ABNORMAL HIGH (ref 70–99)

## 2020-09-10 LAB — MRSA PCR SCREENING: MRSA by PCR: NEGATIVE

## 2020-09-10 LAB — HIV ANTIBODY (ROUTINE TESTING W REFLEX): HIV Screen 4th Generation wRfx: NONREACTIVE

## 2020-09-10 MED ORDER — CHLORHEXIDINE GLUCONATE CLOTH 2 % EX PADS
6.0000 | MEDICATED_PAD | Freq: Every day | CUTANEOUS | Status: DC
  Administered 2020-09-10 – 2020-09-12 (×3): 6 via TOPICAL
  Filled 2020-09-10: qty 6

## 2020-09-10 MED ORDER — ATORVASTATIN CALCIUM 80 MG PO TABS
80.0000 mg | ORAL_TABLET | Freq: Every day | ORAL | Status: DC
  Administered 2020-09-11 – 2020-09-13 (×3): 80 mg via ORAL
  Filled 2020-09-10: qty 1
  Filled 2020-09-10 (×2): qty 4

## 2020-09-10 NOTE — Progress Notes (Signed)
OT Cancellation Note  Patient Details Name: William Hicks MRN: [id number] DOB: Oct 06, 1950   Cancelled Treatment:    Reason Eval/Treat Not Completed: Medical issues which prohibited therapy  Upon speaking with neurologist, pt started on heparin gtt and it would be ideal to hold therapy at this time. Will f/u next available date for OT evaluation. Thank you.  Gerrianne Scale, [place], OTR/L ascom [phone] 09/10/20, 2:39 PM

## 2020-09-10 NOTE — Consult Note (Signed)
ANTICOAGULATION CONSULT NOTE - Initial Consult  Pharmacy Consult for heparin infusion Indication: stroke  Allergies  Allergen Reactions  . Aspirin Diarrhea and Nausea And Vomiting    Patient Measurements: Height: 6' (182.9 cm) Weight: 97.4 kg (214 lb 11.7 oz) IBW/kg (Calculated) : 77.6 Heparin Dosing Weight: 97.5 kg   Labs: Recent Labs    09/09/20 1354 09/10/20 0215 09/10/20 0429 09/10/20 0830  HGB 13.3  --  12.7*  --   HCT 40.2  --  38.9*  --   PLT 184  --  172  --   APTT 25  --   --   --   LABPROT 12.6  --   --   --   INR 0.9  --   --   --   HEPARINUNFRC  --  0.38  --  0.52  CREATININE 0.92  --   --   --     Estimated Creatinine Clearance: 94.2 mL/min (by C-G formula based on SCr of 0.92 mg/dL).   Medical History: Past Medical History:  Diagnosis Date  . Arthritis   . Asthma   . Epistaxis   . GERD (gastroesophageal reflux disease)   . Kidney stones   . Prostate cancer ([place])   . Sinusitis     Medications:  No prior anticoagulation noted  Assessment: 69 presented to the emergency room for evaluation of right-sided headache. On Findings of CTA of head/neck concerning for acute CVA, concerning for intraluminal thombus/dissection. Pharmacy has been consulted for initiation and management of heparin infusion via stroke protocol. Baseline CBC, aPTT, PT, and INR WNL.    Goal of Therapy:  Heparin level 0.3-0.5 units/ml Monitor platelets by anticoagulation protocol: Yes   0420 0215 HL 0.38, therapeutic 0420 0830 HL 0.52, slightly supratherapeutic    Plan:   Will decrease heparin infusion to 1300 units/hr as pt is only slightly above goal   Recheck HL 6 hours  Targeting lower end of heparin therapeutic range: 0.3-0.5   CBC daily  Per neurology: plan to continue heparin infusion 2-3 days  Sherilyn Banker, PharmD Pharmacy Resident  09/10/2020 9:48 AM

## 2020-09-10 NOTE — Plan of Care (Signed)
  Problem: Clinical Measurements: Goal: Ability to maintain clinical measurements within normal limits will improve Outcome: Progressing   

## 2020-09-10 NOTE — Progress Notes (Signed)
PROGRESS NOTE    William Hicks  CXK:[id number] DOB: 1950/10/03 DOA: 09/09/2020 PCP: Maude Leriche, PA-C    Assessment & Plan:   Principal Problem:   CVA (cerebral vascular accident) [facility]) Active Problems:   GERD (gastroesophageal reflux disease)   Malignant neoplasm of prostate ([place])   Cluster headache   Acute CVA (cerebrovascular accident) ([place])   CVA: CT scan of the head without contrast shows cortically based hypodensity in the high right parietal lobe.This has the appearance of acute or subacute infarct. No acute hemorrhage. CTA neck shows concern for intraluminal thrombus in right internal carotid at the skull base extending into the petrous internal carotid arterty w/ high grade (near occlusive) stenosis. Continue on IV heparin for 48 hours more & then transition to dual antiplatelets as per neuro. Continue w/ neuro checks. Needs repeat CTA head in 2-3 months outpatient as per neuro. Neuro following and recs apprec   Possible cluster headache: continue on trial of oxygen therapy. Continue to hold indomethacin secondary to IV heparin drip      DVT prophylaxis: IV heparin  Code Status: full  Family Communication: discussed w/ pt's family at bedside and answered their questions  Disposition Plan: depends on PT/OT recs   Level of care: Stepdown   Status is: Inpatient  Remains inpatient appropriate because:IV treatments appropriate due to intensity of illness or inability to take PO and Inpatient level of care appropriate due to severity of illness   Dispo: The patient is from: Home              Anticipated d/c is to: Home vs SNF vs [facility]               Patient currently is not medically stable to d/c.   Difficult to place patient : unclear    Consultants:  Neuro  Procedures:    Antimicrobials:    Subjective: Pt c/o headache   Objective: Vitals:   09/10/20 0200 09/10/20 0226 09/10/20 0400 09/10/20 0600  BP: 127/81 138/82 (!) 144/83 139/80  Pulse: 75  67 (!) 56 (!) 58  Resp: 18  15 16   Temp:  97.8 F (36.6 C) 98 F (36.7 C)   TempSrc:  Axillary Axillary   SpO2: 96% 97% 98% 94%  Weight:  97.4 kg    Height:  6' (1.829 m)      Intake/Output Summary (Last 24 hours) at 09/10/2020 0906 Last data filed at 09/10/2020 0827 Gross per 24 hour  Intake 123.59 ml  Output 900 ml  Net -776.41 ml   Filed Weights   09/09/20 1351 09/10/20 0226  Weight: 97.5 kg 97.4 kg    Examination:  General exam: Appears calm and comfortable  Respiratory system: Clear to auscultation. Respiratory effort normal. Cardiovascular system: S1 & S2 +. No  rubs, gallops or clicks. Gastrointestinal system: Abdomen is nondistended, soft and nontender. Normal bowel sounds heard. Central nervous system: Alert and oriented. Moves all extremities  Psychiatry: Judgement and insight appear normal. Flat mood and affect     Data Reviewed: I have personally reviewed following labs and imaging studies  CBC: Recent Labs  Lab 09/09/20 1354 09/10/20 0429  WBC 7.8 6.7  NEUTROABS 6.7  --   HGB 13.3 12.7*  HCT 40.2 38.9*  MCV 74.0* 75.7*  PLT 184 970   Basic Metabolic Panel: Recent Labs  Lab 09/09/20 1354  NA 138  K 3.6  CL 103  CO2 26  GLUCOSE 128*  BUN 12  CREATININE 0.92  CALCIUM 9.2   GFR: Estimated Creatinine Clearance: 94.2 mL/min (by C-G formula based on SCr of 0.92 mg/dL). Liver Function Tests: Recent Labs  Lab 09/09/20 1354  AST 21  ALT 21  ALKPHOS 63  BILITOT 0.9  PROT 7.5  ALBUMIN 4.0   No results for input(s): LIPASE, AMYLASE in the last 168 hours. No results for input(s): AMMONIA in the last 168 hours. Coagulation Profile: Recent Labs  Lab 09/09/20 1354  INR 0.9   Cardiac Enzymes: No results for input(s): CKTOTAL, CKMB, CKMBINDEX, TROPONINI in the last 168 hours. BNP (last 3 results) No results for input(s): PROBNP in the last 8760 hours. HbA1C: No results for input(s): HGBA1C in the last 72 hours. CBG: No results for  input(s): GLUCAP in the last 168 hours. Lipid Profile: Recent Labs    09/10/20 0429  CHOL 256*  HDL 78  LDLCALC 154*  TRIG 120  CHOLHDL 3.3   Thyroid Function Tests: No results for input(s): TSH, T4TOTAL, FREET4, T3FREE, THYROIDAB in the last 72 hours. Anemia Panel: No results for input(s): VITAMINB12, FOLATE, FERRITIN, TIBC, IRON, RETICCTPCT in the last 72 hours. Sepsis Labs: No results for input(s): PROCALCITON, LATICACIDVEN in the last 168 hours.  Recent Results (from the past 240 hour(s))  Resp Panel by RT-PCR (Flu A&B, Covid) Nasopharyngeal Swab     Status: None   Collection Time: 09/09/20  5:32 PM   Specimen: Nasopharyngeal Swab; Nasopharyngeal(NP) swabs in vial transport medium  Result Value Ref Range Status   SARS Coronavirus 2 by RT PCR NEGATIVE NEGATIVE Final    Comment: (NOTE) SARS-CoV-2 target nucleic acids are NOT DETECTED.  The SARS-CoV-2 RNA is generally detectable in upper respiratory specimens during the acute phase of infection. The lowest concentration of SARS-CoV-2 viral copies this assay can detect is 138 copies/mL. A negative result does not preclude SARS-Cov-2 infection and should not be used as the sole basis for treatment or other patient management decisions. A negative result may occur with  improper specimen collection/handling, submission of specimen other than nasopharyngeal swab, presence of viral mutation(s) within the areas targeted by this assay, and inadequate number of viral copies(<138 copies/mL). A negative result must be combined with clinical observations, patient history, and epidemiological information. The expected result is Negative.  Fact Sheet for Patients:  EntrepreneurPulse.com.au  Fact Sheet for Healthcare Providers:  IncredibleEmployment.be  This test is no t yet approved or cleared by the [place] FDA and  has been authorized for detection and/or diagnosis of SARS-CoV-2 by FDA  under an Emergency Use Authorization (EUA). This EUA will remain  in effect (meaning this test can be used) for the duration of the COVID-19 declaration under Section 564(b)(1) of the Act, 21 U.S.C.section 360bbb-3(b)(1), unless the authorization is terminated  or revoked sooner.       Influenza A by PCR NEGATIVE NEGATIVE Final   Influenza B by PCR NEGATIVE NEGATIVE Final    Comment: (NOTE) The Xpert Xpress SARS-CoV-2/FLU/RSV plus assay is intended as an aid in the diagnosis of influenza from Nasopharyngeal swab specimens and should not be used as a sole basis for treatment. Nasal washings and aspirates are unacceptable for Xpert Xpress SARS-CoV-2/FLU/RSV testing.  Fact Sheet for Patients: EntrepreneurPulse.com.au  Fact Sheet for Healthcare Providers: IncredibleEmployment.be  This test is not yet approved or cleared by the [place] FDA and has been authorized for detection and/or diagnosis of SARS-CoV-2 by FDA under an Emergency Use Authorization (EUA). This EUA will remain in effect (meaning this test can  be used) for the duration of the COVID-19 declaration under Section 564(b)(1) of the Act, 21 U.S.C. section 360bbb-3(b)(1), unless the authorization is terminated or revoked.  Performed at [facility], [address] [place], [place] [address]   MRSA PCR Screening     Status: None   Collection Time: 09/10/20  2:43 AM   Specimen: Nasopharyngeal  Result Value Ref Range Status   MRSA by PCR NEGATIVE NEGATIVE Final    Comment:        The GeneXpert MRSA Assay (FDA approved for NASAL specimens only), is one component of a comprehensive MRSA colonization surveillance program. It is not intended to diagnose MRSA infection nor to guide or monitor treatment for MRSA infections. Performed at [facility], [address], [place], [place] [address]          Radiology Studies: CT ANGIO HEAD W OR WO  CONTRAST  Result Date: 09/09/2020 CLINICAL DATA:  Neuro deficit, acute stroke suspected. EXAM: CT ANGIOGRAPHY HEAD AND NECK TECHNIQUE: Multidetector CT imaging of the head and neck was performed using the standard protocol during bolus administration of intravenous contrast. Multiplanar CT image reconstructions and MIPs were obtained to evaluate the vascular anatomy. Carotid stenosis measurements (when applicable) are obtained utilizing NASCET criteria, using the distal internal carotid diameter as the denominator. CONTRAST:  41mL OMNIPAQUE IOHEXOL 350 MG/ML SOLN COMPARISON:  Same day CT head. FINDINGS: CTA NECK FINDINGS Aortic arch: Great vessel origins are patent. Right carotid system: . Intraluminal thrombus skull base is detailed below in the CTA head findings. Otherwise, no evidence of dissection, stenosis (50% or greater) or occlusion Left carotid system: No evidence of dissection, stenosis (50% or greater) or occlusion. Vertebral arteries: Codominant. No evidence of dissection, stenosis (50% or greater) or occlusion. Skeleton: Moderate multilevel degenerative disc disease. Other neck: No mass or adenopathy. Upper chest: Visualized lung apices are clear. Review of the MIP images confirms the above findings CTA HEAD FINDINGS Anterior circulation: Evidence of intraluminal thrombus within the internal carotid artery at the skull base with contrast surrounding the rounded filling defect (for example series 6, image 233). Thrombus extending into the proximal petrous internal carotid artery with resulting high-grade, nearly occlusive stenosis. More distal petrous and cavernous ICA is patent. Absent right A1 ACA, likely congenital. Otherwise, bilateral proximal MCAs and ACAs are patent. Multifocal mild stenosis of the right ACA. Posterior circulation: Small vertebrobasilar system with bilateral fetal type PCAs anatomic variant. The left intradural vertebral artery is small throughout its course and makes a small  contribution to the basilar artery and the larger contribution to the left PICA. Bilateral posterior cerebral arteries are patent without evidence of proximal hemodynamically significant stenosis. No aneurysm. Venous sinuses: As permitted by contrast timing, patent. Review of the MIP images confirms the above findings IMPRESSION: 1. Findings concerning for intraluminal thrombus in the right internal carotid artery at the skull base, extending into the petrous internal carotid artery with high-grade (near occlusive) stenosis with more distal opacification of the ICA intracranially. While indeterminate in etiology, findings could potentially be secondary to dissection given the patient's relative lack of atherosclerosis elsewhere. Catheter arteriogram could further characterize if clinically indicated. 2. Otherwise, no significant proximal stenosis in the head or neck. Findings discussed with Dr. Amie Portland, MD via telephone at 5:14 PM. Electronically Signed   By: Margaretha Sheffield MD   On: 09/09/2020 17:21   CT HEAD WO CONTRAST  Result Date: 09/09/2020 CLINICAL DATA:  Acute neuro deficit. EXAM: CT HEAD WITHOUT  CONTRAST TECHNIQUE: Contiguous axial images were obtained from the base of the skull through the vertex without intravenous contrast. COMPARISON:  CT head 04/29/2010 FINDINGS: Brain: Cortically based hypodensity in the high right parietal lobe is ill-defined and appears acute. This was not present previously. No other areas of abnormality. No hemorrhage. Ventricle size normal. Vascular: Negative for hyperdense vessel Skull: Negative Sinuses/Orbits: Mucosal edema paranasal sinuses.  No orbital lesion. Other: None IMPRESSION: Cortically based hypodensity in the high right parietal lobe. This has the appearance of acute or subacute infarct. No acute hemorrhage. These results were called by telephone at the time of interpretation on 09/09/2020 at 2:36 pm to provider [facility] , who verbally acknowledged  these results. Electronically Signed   By: Franchot Gallo M.D.   On: 09/09/2020 14:36   CT ANGIO NECK W OR WO CONTRAST  Result Date: 09/09/2020 CLINICAL DATA:  Neuro deficit, acute stroke suspected. EXAM: CT ANGIOGRAPHY HEAD AND NECK TECHNIQUE: Multidetector CT imaging of the head and neck was performed using the standard protocol during bolus administration of intravenous contrast. Multiplanar CT image reconstructions and MIPs were obtained to evaluate the vascular anatomy. Carotid stenosis measurements (when applicable) are obtained utilizing NASCET criteria, using the distal internal carotid diameter as the denominator. CONTRAST:  10mL OMNIPAQUE IOHEXOL 350 MG/ML SOLN COMPARISON:  Same day CT head. FINDINGS: CTA NECK FINDINGS Aortic arch: Great vessel origins are patent. Right carotid system: . Intraluminal thrombus skull base is detailed below in the CTA head findings. Otherwise, no evidence of dissection, stenosis (50% or greater) or occlusion Left carotid system: No evidence of dissection, stenosis (50% or greater) or occlusion. Vertebral arteries: Codominant. No evidence of dissection, stenosis (50% or greater) or occlusion. Skeleton: Moderate multilevel degenerative disc disease. Other neck: No mass or adenopathy. Upper chest: Visualized lung apices are clear. Review of the MIP images confirms the above findings CTA HEAD FINDINGS Anterior circulation: Evidence of intraluminal thrombus within the internal carotid artery at the skull base with contrast surrounding the rounded filling defect (for example series 6, image 233). Thrombus extending into the proximal petrous internal carotid artery with resulting high-grade, nearly occlusive stenosis. More distal petrous and cavernous ICA is patent. Absent right A1 ACA, likely congenital. Otherwise, bilateral proximal MCAs and ACAs are patent. Multifocal mild stenosis of the right ACA. Posterior circulation: Small vertebrobasilar system with bilateral fetal type  PCAs anatomic variant. The left intradural vertebral artery is small throughout its course and makes a small contribution to the basilar artery and the larger contribution to the left PICA. Bilateral posterior cerebral arteries are patent without evidence of proximal hemodynamically significant stenosis. No aneurysm. Venous sinuses: As permitted by contrast timing, patent. Review of the MIP images confirms the above findings IMPRESSION: 1. Findings concerning for intraluminal thrombus in the right internal carotid artery at the skull base, extending into the petrous internal carotid artery with high-grade (near occlusive) stenosis with more distal opacification of the ICA intracranially. While indeterminate in etiology, findings could potentially be secondary to dissection given the patient's relative lack of atherosclerosis elsewhere. Catheter arteriogram could further characterize if clinically indicated. 2. Otherwise, no significant proximal stenosis in the head or neck. Findings discussed with Dr. Amie Portland, MD via telephone at 5:14 PM. Electronically Signed   By: Margaretha Sheffield MD   On: 09/09/2020 17:21   MR BRAIN W WO CONTRAST  Result Date: 09/09/2020 CLINICAL DATA:  Neuro deficit, acute stroke suspected. EXAM: MRI HEAD WITHOUT AND WITH CONTRAST TECHNIQUE: Multiplanar, multiecho pulse  sequences of the brain and surrounding structures were obtained without and with intravenous contrast. CONTRAST:  30mL GADAVIST GADOBUTROL 1 MMOL/ML IV SOLN COMPARISON:  Same day CT exams. FINDINGS: Brain: Acute right parieto-occipital cortical infarct. Associated edema without mass effect. No acute hemorrhage. No midline shift. Basal cisterns are patent. No hydrocephalus. No extra-axial fluid collection. No abnormal enhancement. Vascular: Poor flow void within the right ICA at the skull base with filling defect on postcontrast imaging, consistent with findings described on same day CTA. Arachnoid granulations in the  posterior aspect of the superior sagittal sinus and the right transverse sinus. Skull and upper cervical spine: Normal marrow signal. Sinuses/Orbits: Scattered mucosal thickening of ethmoid air cells and frontal sinuses. Other: No mastoid effusions. IMPRESSION: 1. Acute right parieto-occipital cortical infarct. Associated edema without mass effect. 2. See same day CTA for characterization of vascular findings. 3. Frontoethmoidal paranasal sinus mucosal thickening. Electronically Signed   By: Margaretha Sheffield MD   On: 09/09/2020 19:39        Scheduled Meds: . aspirin  81 mg Oral Daily  . atorvastatin  40 mg Oral Daily  . Chlorhexidine Gluconate Cloth  6 each Topical Q0600  . fluorescein  1 strip Both Eyes Once  . montelukast  10 mg Oral QHS  . pantoprazole  40 mg Oral Daily  . sodium chloride flush  3 mL Intravenous Once  . tetracaine  2 drop Both Eyes Once  . triamcinolone  1 spray Nasal Daily   Continuous Infusions: . heparin 1,350 Units/hr (09/09/20 1946)     LOS: 1 day    Time spent: 33 mins     Wyvonnia Dusky, MD [facility] Pager [phone]  If 7PM-7AM, please contact night-coverage 09/10/2020, 9:06 AM

## 2020-09-10 NOTE — Progress Notes (Signed)
*  PRELIMINARY RESULTS* Echocardiogram 2D Echocardiogram has been performed.  Wallie Char Accalia Rigdon 09/10/2020, 10:41 AM

## 2020-09-10 NOTE — Progress Notes (Addendum)
Neurology Progress Note   S:// Seen and examined.  Reports continuing headache but less in intensity than yesterday.  Continues to report headache more over the right forehead and over the maxillary sinus area on the right. Had an MRI brain with and without contrast done overnight   O:// Current vital signs: BP 139/80 (BP Location: Right Arm)   Pulse (!) 58   Temp 98 F (36.7 C) (Axillary)   Resp 16   Ht 6' (1.829 m)   Wt 97.4 kg   SpO2 94%   BMI 29.12 kg/m  Vital signs in last 24 hours: Temp:  [97.8 F (36.6 C)-98.5 F (36.9 C)] 98 F (36.7 C) (04/20 0400) Pulse Rate:  [55-90] 58 (04/20 0600) Resp:  [15-19] 16 (04/20 0600) BP: (127-165)/(70-92) 139/80 (04/20 0600) SpO2:  [94 %-100 %] 94 % (04/20 0600) Weight:  [97.4 kg-97.5 kg] 97.4 kg (04/20 0226) GENERAL: Awake, alert in NAD HEENT: - Normocephalic and atraumatic, dry mm, no LN++, no Thyromegally, continues to have palpable tenderness over the right frontal and maxillary sinuses. LUNGS - Clear to auscultation bilaterally with no wheezes CV - S1S2 RRR, no m/r/g, equal pulses bilaterally. ABDOMEN - Soft, nontender, nondistended with normoactive BS Ext: warm, well perfused, no edema NEURO:  Mental Status: Awake alert oriented x3 Speech and language: No evidence of dysarthria or aphasia Cranial nerves: Right pupil is 2 mm sluggishly reactive, left pupil is 3 mm normally reactive, no visual field cuts, no facial asymmetry, facial sensation intact, tongue and palate midline. Motor exam: 5/5 in all fours without vertical drift with normal tone and normal range of motion. Sensation intact light touch without extinction Coordination: Intact finger-nose-finger testing bilaterally Gait testing deferred NIH stroke scale remains 0 Medications  Current Facility-Administered Medications:  .  acetaminophen (TYLENOL) tablet 650 mg, 650 mg, Oral, Q4H PRN, 650 mg at 09/10/20 0842 **OR** acetaminophen (TYLENOL) 160 MG/5ML solution 650  mg, 650 mg, Per Tube, Q4H PRN **OR** acetaminophen (TYLENOL) suppository 650 mg, 650 mg, Rectal, Q4H PRN, Agbata, Tochukwu, MD .  aspirin chewable tablet 81 mg, 81 mg, Oral, Daily, Amie Portland, MD, 81 mg at 09/10/20 0837 .  atorvastatin (LIPITOR) tablet 40 mg, 40 mg, Oral, Daily, Agbata, Tochukwu, MD, 40 mg at 09/10/20 0838 .  Chlorhexidine Gluconate Cloth 2 % PADS 6 each, 6 each, Topical, Q0600, Agbata, Tochukwu, MD, 6 each at 09/10/20 0651 .  fluorescein ophthalmic strip 1 strip, 1 strip, Both Eyes, Once, Nance Pear, MD .  heparin ADULT infusion 100 units/mL (25000 units/237mL), 1,350 Units/hr, Intravenous, Continuous, Dorothe Pea, RPH, Last Rate: 13.5 mL/hr at 09/09/20 1946, 1,350 Units/hr at 09/09/20 1946 .  montelukast (SINGULAIR) tablet 10 mg, 10 mg, Oral, QHS, Agbata, Tochukwu, MD, 10 mg at 09/09/20 2336 .  pantoprazole (PROTONIX) EC tablet 40 mg, 40 mg, Oral, Daily, Agbata, Tochukwu, MD, 40 mg at 09/10/20 0838 .  sodium chloride flush (NS) 0.9 % injection 3 mL, 3 mL, Intravenous, Once, Nance Pear, MD .  tetracaine (PONTOCAINE) 0.5 % ophthalmic solution 2 drop, 2 drop, Both Eyes, Once, Nance Pear, MD .  triamcinolone (NASACORT) nasal inhaler 1 spray, 1 spray, Nasal, Daily, Agbata, Tochukwu, MD, 1 spray at 09/10/20 0839 Labs CBC    Component Value Date/Time   WBC 6.7 09/10/2020 0429   RBC 5.14 09/10/2020 0429   HGB 12.7 (L) 09/10/2020 0429   HGB 12.9 (L) 12/28/2016 1446   HCT 38.9 (L) 09/10/2020 0429   HCT 39.3 12/28/2016 1446   PLT 172  09/10/2020 0429   PLT 171 12/28/2016 1446   MCV 75.7 (L) 09/10/2020 0429   MCV 74 (L) 12/28/2016 1446   MCH 24.7 (L) 09/10/2020 0429   MCHC 32.6 09/10/2020 0429   RDW 13.5 09/10/2020 0429   RDW 14.4 12/28/2016 1446   LYMPHSABS 0.7 09/09/2020 1354   LYMPHSABS 0.9 12/28/2016 1446   MONOABS 0.4 09/09/2020 1354   EOSABS 0.0 09/09/2020 1354   EOSABS 0.2 12/28/2016 1446   BASOSABS 0.0 09/09/2020 1354   BASOSABS 0.0  12/28/2016 1446    CMP     Component Value Date/Time   NA 138 09/09/2020 1354   K 3.6 09/09/2020 1354   CL 103 09/09/2020 1354   CO2 26 09/09/2020 1354   GLUCOSE 128 (H) 09/09/2020 1354   BUN 12 09/09/2020 1354   CREATININE 0.92 09/09/2020 1354   CALCIUM 9.2 09/09/2020 1354   PROT 7.5 09/09/2020 1354   ALBUMIN 4.0 09/09/2020 1354   AST 21 09/09/2020 1354   ALT 21 09/09/2020 1354   ALKPHOS 63 09/09/2020 1354   BILITOT 0.9 09/09/2020 1354   GFRNONAA >60 09/09/2020 1354   GFRAA >60 06/25/2018 1111    glycosylated hemoglobin- pending  Lipid Panel     Component Value Date/Time   CHOL 256 (H) 09/10/2020 0429   TRIG 120 09/10/2020 0429   HDL 78 09/10/2020 0429   CHOLHDL 3.3 09/10/2020 0429   VLDL 24 09/10/2020 0429   LDLCALC 154 (H) 09/10/2020 0429   A1c pending LDL 154 2D echo pending  Imaging I have reviewed images in epic and the results pertinent to this consultation are: CT head reviewed: Right parietal hypodensity, no bleed. MRI of the brain with and without contrast reviewed: Confirms the right parietal hypodensity seen on CT scan to be consistent with an acute ischemic stroke.  Scattered mucosal thickening of the ethmoidal air cells and frontal sinuses.  Poorly visualized right internal carotid flow-void at the skull base on T2 images. CTA head and neck reviewed: Concerning for intraluminal thrombus in the right internal carotid artery at the skull base extending into the petrous internal carotid artery with high-grade/near occlusive stenosis with more distal opacification of the ICA intracranially- could reflect a dissection as there is lack of atherosclerosis elsewhere.  Otherwise no proximal stenosis in the head and neck.  Assessment:  70 year old man with past medical history of prostate cancer, asthma and arthritis presented to the emergency room for right-sided headache of 2 days duration, centered around the right forehead and right maxilla with pressure-like  feeling with minimal relief with over-the-counter NSAIDs, noted to have an abnormal CT head finding of a right parietal hypodensity which was later verified to be an acute ischemic stroke in the right parietal lobe. 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  He was started on heparin drip yesterday. Continues to do well.  On examination, NIH stroke scale remains 0.  Headache is better but not completely resolved.  Impression: Acute ischemic stroke-likely secondary to right ICA thrombus versus dissection  Recommendations:  Frequent neurochecks and telemetry  Continue heparin drip for another 48 hours.  After that we will transition him to dual antiplatelets given that the dissection extends intracranially  Continue aspirin 81  High-dose statin for goal LDL of less than 70.  His current LDL is 154.  A1c and 2D echo are pending  In case of any neurological worsening, repeat  brain imaging in the form of CTA head and neck and MRI of the brain as well as consideration for transfer to a tertiary center for possible catheter angiogram still remains a consideration.  In 2 to 3 months: Repeat CTA head and outpatient neurology follow-up  PT OT  Symptomatic management of the headache with Tylenol.  Can use low-dose opiates for severe headaches-would avoid NSAIDs given he is on heparin drip and baby aspirin  I have also discussed this case and imaging with the reading neuroradiologist as well as the stroke team at [facility] relayed to the primary hospitalist Dr. Jimmye Norman via secure text  -- Amie Portland, MD Neurologist [facility] Pager: [phone]

## 2020-09-10 NOTE — Consult Note (Signed)
ANTICOAGULATION CONSULT NOTE - Initial Consult  Pharmacy Consult for heparin infusion Indication: stroke  Allergies  Allergen Reactions  . Aspirin Diarrhea and Nausea And Vomiting    Patient Measurements: Height: 6' (182.9 cm) Weight: 97.4 kg (214 lb 11.7 oz) IBW/kg (Calculated) : 77.6 Heparin Dosing Weight: 97.5 kg   Labs: Recent Labs    09/09/20 1354 09/10/20 0215 09/10/20 0429 09/10/20 0830 09/10/20 1448 09/10/20 2105  HGB 13.3  --  12.7*  --   --   --   HCT 40.2  --  38.9*  --   --   --   PLT 184  --  172  --   --   --   APTT 25  --   --   --   --   --   LABPROT 12.6  --   --   --   --   --   INR 0.9  --   --   --   --   --   HEPARINUNFRC  --    < >  --  0.52 0.49 0.70  CREATININE 0.92  --   --   --   --   --    < > = values in this interval not displayed.    Estimated Creatinine Clearance: 94.2 mL/min (by C-G formula based on SCr of 0.92 mg/dL).   Medical History: Past Medical History:  Diagnosis Date  . Arthritis   . Asthma   . Epistaxis   . GERD (gastroesophageal reflux disease)   . Kidney stones   . Prostate cancer ([place])   . Sinusitis     Medications:  No prior anticoagulation noted  Assessment: 69 presented to the emergency room for evaluation of right-sided headache. On Findings of CTA of head/neck concerning for acute CVA, concerning for intraluminal thombus/dissection. Pharmacy has been consulted for initiation and management of heparin infusion via stroke protocol. Baseline CBC, aPTT, PT, and INR WNL.    Goal of Therapy:  Heparin level 0.3-0.5 units/ml Monitor platelets by anticoagulation protocol: Yes   0420 0215 HL 0.38, therapeutic 0420 0830 HL 0.52, slightly supratherapeutic  0420 1448 HL 0.49, therapeutic @ 1300 units/hr 0420 2105 HL 0.70, supratherapeutic     Plan:   HL supratherapeutic, will decrease heparin rate to 1100 units/hr  Recheck HL 6 hours following rate change  Targeting lower end of heparin therapeutic range:  0.3-0.5   CBC daily  Per neurology: plan to continue heparin infusion 2-3 days  Dorothe Pea, PharmD, BCPS 09/10/2020 9:39 PM

## 2020-09-10 NOTE — Consult Note (Signed)
ANTICOAGULATION CONSULT NOTE - Initial Consult  Pharmacy Consult for heparin infusion Indication: stroke  Allergies  Allergen Reactions  . Aspirin Diarrhea and Nausea And Vomiting    Patient Measurements: Height: 6' (182.9 cm) Weight: 97.4 kg (214 lb 11.7 oz) IBW/kg (Calculated) : 77.6 Heparin Dosing Weight: 97.5 kg   Labs: Recent Labs    09/09/20 1354 09/10/20 0215  HGB 13.3  --   HCT 40.2  --   PLT 184  --   APTT 25  --   LABPROT 12.6  --   INR 0.9  --   HEPARINUNFRC  --  0.38  CREATININE 0.92  --     Estimated Creatinine Clearance: 94.2 mL/min (by C-G formula based on SCr of 0.92 mg/dL).   Medical History: Past Medical History:  Diagnosis Date  . Arthritis   . Asthma   . Epistaxis   . GERD (gastroesophageal reflux disease)   . Kidney stones   . Prostate cancer ([place])   . Sinusitis     Medications:  No prior anticoagulation noted  Assessment: 69 presented to the emergency room for evaluation of right-sided headache. On Findings of CTA of head/neck concerning for acute CVA, concerning for intraluminal thombus/dissection. Pharmacy has been consulted for initiation and management of heparin infusion via stroke protocol.   Goal of Therapy:  Heparin level 0.3-0.5 units/ml Monitor platelets by anticoagulation protocol: Yes   0420 0215 HL 0.38, therapeutic   Plan:   Will continue heparin infusion at 1350 units/hr   Recheck HL 6 hours to confirm then daily  Targeting lower end of heparin therapeutic range: 0.3-0.5   CBC daily  Per neurology: plan to continue heparin infusion 2-3 days  Renda Rolls, PharmD, [facility] 09/10/2020 3:27 AM

## 2020-09-10 NOTE — Progress Notes (Signed)
PT Cancellation Note  Patient Details Name: William Hicks MRN: [id number] DOB: [birth year]   Cancelled Treatment:    Reason Eval/Treat Not Completed: Other (comment);Medical issues which prohibited therapy. Upon speaking with neurologist, pt started on heparin gtt and it would be ideal to hold therapy at this time. Will f/u next available date for PT evaluation. Thank you.  Lieutenant Diego PT, DPT 3:35 PM,09/10/20

## 2020-09-10 NOTE — Consult Note (Signed)
ANTICOAGULATION CONSULT NOTE - Initial Consult  Pharmacy Consult for heparin infusion Indication: stroke  Allergies  Allergen Reactions  . Aspirin Diarrhea and Nausea And Vomiting    Patient Measurements: Height: 6' (182.9 cm) Weight: 97.4 kg (214 lb 11.7 oz) IBW/kg (Calculated) : 77.6 Heparin Dosing Weight: 97.5 kg   Labs: Recent Labs    09/09/20 1354 09/10/20 0215 09/10/20 0429 09/10/20 0830 09/10/20 1448  HGB 13.3  --  12.7*  --   --   HCT 40.2  --  38.9*  --   --   PLT 184  --  172  --   --   APTT 25  --   --   --   --   LABPROT 12.6  --   --   --   --   INR 0.9  --   --   --   --   HEPARINUNFRC  --  0.38  --  0.52 0.49  CREATININE 0.92  --   --   --   --     Estimated Creatinine Clearance: 94.2 mL/min (by C-G formula based on SCr of 0.92 mg/dL).   Medical History: Past Medical History:  Diagnosis Date  . Arthritis   . Asthma   . Epistaxis   . GERD (gastroesophageal reflux disease)   . Kidney stones   . Prostate cancer ([place])   . Sinusitis     Medications:  No prior anticoagulation noted  Assessment: 69 presented to the emergency room for evaluation of right-sided headache. On Findings of CTA of head/neck concerning for acute CVA, concerning for intraluminal thombus/dissection. Pharmacy has been consulted for initiation and management of heparin infusion via stroke protocol. Baseline CBC, aPTT, PT, and INR WNL.    Goal of Therapy:  Heparin level 0.3-0.5 units/ml Monitor platelets by anticoagulation protocol: Yes   0420 0215 HL 0.38, therapeutic 0420 0830 HL 0.52, slightly supratherapeutic  0420 1448 HL 0.49, therapeutic   Plan:   HL therapeutic, will continue current heparin rate at 1300 units/hr  Recheck HL 6 hours  Targeting lower end of heparin therapeutic range: 0.3-0.5   CBC daily  Per neurology: plan to continue heparin infusion 2-3 days  Sherilyn Banker, PharmD Pharmacy Resident  09/10/2020 3:20 PM

## 2020-09-10 NOTE — Progress Notes (Signed)
SLP Cancellation Note  Patient Details Name: William Hicks MRN: [id number] DOB: 1950-12-14   Cancelled treatment:       Reason Eval/Treat Not Completed: SLP screened, no needs identified, will sign off (chart reviewed; consulted NSG then met w/ pt in room) Pt denied any difficulty swallowing and is currently on a regular diet; tolerates swallowing pills w/ water per NSG. Pt conversed at conversational level w/out deficits noted; pt and Wife who arrived denied any speech-language deficits.  No further skilled ST services indicated as pt appears at his baseline. Pt agreed. NSG to reconsult if any change in status while admitted.       Orinda Kenner, MS, CCC-SLP Speech Language Pathologist [facility] [phone] [facility] 09/10/2020, 10:45 AM

## 2020-09-11 DIAGNOSIS — R519 Headache, unspecified: Secondary | ICD-10-CM

## 2020-09-11 LAB — CBC
HCT: 40.5 % (ref 39.0–52.0)
Hemoglobin: 13.2 g/dL (ref 13.0–17.0)
MCH: 24.4 pg — ABNORMAL LOW (ref 26.0–34.0)
MCHC: 32.6 g/dL (ref 30.0–36.0)
MCV: 74.9 fL — ABNORMAL LOW (ref 80.0–100.0)
Platelets: 178 10*3/uL (ref 150–400)
RBC: 5.41 MIL/uL (ref 4.22–5.81)
RDW: 13.5 % (ref 11.5–15.5)
WBC: 6.5 10*3/uL (ref 4.0–10.5)
nRBC: 0 % (ref 0.0–0.2)

## 2020-09-11 LAB — BASIC METABOLIC PANEL
Anion gap: 8 (ref 5–15)
BUN: 18 mg/dL (ref 8–23)
CO2: 27 mmol/L (ref 22–32)
Calcium: 8.9 mg/dL (ref 8.9–10.3)
Chloride: 104 mmol/L (ref 98–111)
Creatinine, Ser: 0.9 mg/dL (ref 0.61–1.24)
GFR, Estimated: >60 mL/min (ref >60)
Glucose, Bld: 109 mg/dL — ABNORMAL HIGH (ref 70–99)
Potassium: 3.6 mmol/L (ref 3.5–5.1)
Sodium: 139 mmol/L (ref 135–145)

## 2020-09-11 LAB — HEPARIN LEVEL (UNFRACTIONATED)
Heparin Unfractionated: 0.79 IU/mL — ABNORMAL HIGH (ref 0.30–0.70)
Heparin Unfractionated: 0.42 IU/mL (ref 0.30–0.70)
Heparin Unfractionated: 0.27 IU/mL — ABNORMAL LOW (ref 0.30–0.70)

## 2020-09-11 MED ORDER — HEPARIN (PORCINE) 25000 UT/250ML-% IV SOLN
1000.0000 [IU]/h | INTRAVENOUS | Status: DC
  Administered 2020-09-11 (×2): 800 [IU]/h via INTRAVENOUS
  Filled 2020-09-11: qty 250

## 2020-09-11 MED ORDER — HYDRALAZINE HCL 20 MG/ML IJ SOLN: 10.0000 mg | Freq: Four times a day (QID) | INTRAMUSCULAR | Status: DC | PRN

## 2020-09-11 MED ORDER — PROCHLORPERAZINE EDISYLATE 10 MG/2ML IJ SOLN
10.0000 mg | Freq: Once | INTRAMUSCULAR | Status: AC
  Administered 2020-09-11: 10 mg via INTRAVENOUS
  Filled 2020-09-11 (×2): qty 2

## 2020-09-11 MED ORDER — TRAMADOL HCL 50 MG PO TABS
50.0000 mg | ORAL_TABLET | Freq: Once | ORAL | Status: AC
  Administered 2020-09-11: 50 mg via ORAL
  Filled 2020-09-11: qty 1

## 2020-09-11 MED ORDER — PROCHLORPERAZINE MALEATE 5 MG PO TABS
5.0000 mg | ORAL_TABLET | Freq: Four times a day (QID) | ORAL | Status: DC | PRN
  Administered 2020-09-11 – 2020-09-12 (×3): 5 mg via ORAL
  Filled 2020-09-11 (×5): qty 1

## 2020-09-11 NOTE — Progress Notes (Signed)
PROGRESS NOTE    William Hicks  BMW:[id number] DOB: Dec 14, 1950 DOA: 09/09/2020 PCP: Maude Leriche, PA-C    Assessment & Plan:   Principal Problem:   CVA (cerebral vascular accident) [facility]) Active Problems:   GERD (gastroesophageal reflux disease)   Malignant neoplasm of prostate ([place])   Cluster headache   Acute CVA (cerebrovascular accident) ([place])   CVA: CT scan of the head without contrast shows cortically based hypodensity in the high right parietal lobe.This has the appearance of acute or subacute infarct. No acute hemorrhage. CTA neck shows concern for intraluminal thrombus in right internal carotid at the skull base extending into the petrous internal carotid arterty w/ high grade (near occlusive) stenosis. Continue on IV heparin x 24 hours more & will transition to dual antiplatelets as per neuro. Continue w/ neuro checks. Needs a repeat CTA head in 2-3 months outpatient as per neuro. Neuro recs apprec   Headache: etiology unclear, tension vs cluster. Continue on trial of oxygen therapy. Started on compazine and may need steroids as per neuro. Continue to hold indomethacin secondary to IV heparin drip      DVT prophylaxis: IV heparin  Code Status: full  Family Communication:  Disposition Plan: depends on PT/OT recs   Level of care: Stepdown   Status is: Inpatient  Remains inpatient appropriate because:IV treatments appropriate due to intensity of illness or inability to take PO and Inpatient level of care appropriate due to severity of illness   Dispo: The patient is from: Home              Anticipated d/c is to: Home vs [facility]               Patient currently is not medically stable to d/c.   Difficult to place patient : unclear    Consultants:  Neuro  Procedures:    Antimicrobials:    Subjective: Pt c/o headache still   Objective: Vitals:   09/11/20 0100 09/11/20 0200 09/11/20 0600 09/11/20 0700  BP: 122/72 134/82 137/82 125/85  Pulse: 69 (!) 59  (!) 59 65  Resp: 18 18 10 19   Temp:  98.3 F (36.8 C)    TempSrc:  Oral    SpO2: 96% 96% 98% 96%  Weight:      Height:        Intake/Output Summary (Last 24 hours) at 09/11/2020 0801 Last data filed at 09/11/2020 0610 Gross per 24 hour  Intake 369.43 ml  Output 1000 ml  Net -630.57 ml   Filed Weights   09/09/20 1351 09/10/20 0226  Weight: 97.5 kg 97.4 kg    Examination:  General exam: Appears uncomfortable  Respiratory system: clear breath sounds b/l  Cardiovascular system: S1/S2+. No rubs or gallops  Gastrointestinal system: Abd is soft, NT, ND & normal bowel sounds  Central nervous system: Alert and oriented. Moves all extremities  Psychiatry: Judgement and insight appear normal. Flat mood and affect     Data Reviewed: I have personally reviewed following labs and imaging studies  CBC: Recent Labs  Lab 09/09/20 1354 09/10/20 0429 09/11/20 0349  WBC 7.8 6.7 6.5  NEUTROABS 6.7  --   --   HGB 13.3 12.7* 13.2  HCT 40.2 38.9* 40.5  MCV 74.0* 75.7* 74.9*  PLT 184 172 272   Basic Metabolic Panel: Recent Labs  Lab 09/09/20 1354 09/11/20 0349  NA 138 139  K 3.6 3.6  CL 103 104  CO2 26 27  GLUCOSE 128* 109*  BUN 12  18  CREATININE 0.92 0.90  CALCIUM 9.2 8.9   GFR: Estimated Creatinine Clearance: 96.3 mL/min (by C-G formula based on SCr of 0.9 mg/dL). Liver Function Tests: Recent Labs  Lab 09/09/20 1354  AST 21  ALT 21  ALKPHOS 63  BILITOT 0.9  PROT 7.5  ALBUMIN 4.0   No results for input(s): LIPASE, AMYLASE in the last 168 hours. No results for input(s): AMMONIA in the last 168 hours. Coagulation Profile: Recent Labs  Lab 09/09/20 1354  INR 0.9   Cardiac Enzymes: No results for input(s): CKTOTAL, CKMB, CKMBINDEX, TROPONINI in the last 168 hours. BNP (last 3 results) No results for input(s): PROBNP in the last 8760 hours. HbA1C: Recent Labs    09/10/20 0429  HGBA1C 5.6   CBG: Recent Labs  Lab 09/10/20 0251  GLUCAP 109*   Lipid  Profile: Recent Labs    09/10/20 0429  CHOL 256*  HDL 78  LDLCALC 154*  TRIG 120  CHOLHDL 3.3   Thyroid Function Tests: No results for input(s): TSH, T4TOTAL, FREET4, T3FREE, THYROIDAB in the last 72 hours. Anemia Panel: No results for input(s): VITAMINB12, FOLATE, FERRITIN, TIBC, IRON, RETICCTPCT in the last 72 hours. Sepsis Labs: No results for input(s): PROCALCITON, LATICACIDVEN in the last 168 hours.  Recent Results (from the past 240 hour(s))  Resp Panel by RT-PCR (Flu A&B, Covid) Nasopharyngeal Swab     Status: None   Collection Time: 09/09/20  5:32 PM   Specimen: Nasopharyngeal Swab; Nasopharyngeal(NP) swabs in vial transport medium  Result Value Ref Range Status   SARS Coronavirus 2 by RT PCR NEGATIVE NEGATIVE Final    Comment: (NOTE) SARS-CoV-2 target nucleic acids are NOT DETECTED.  The SARS-CoV-2 RNA is generally detectable in upper respiratory specimens during the acute phase of infection. The lowest concentration of SARS-CoV-2 viral copies this assay can detect is 138 copies/mL. A negative result does not preclude SARS-Cov-2 infection and should not be used as the sole basis for treatment or other patient management decisions. A negative result may occur with  improper specimen collection/handling, submission of specimen other than nasopharyngeal swab, presence of viral mutation(s) within the areas targeted by this assay, and inadequate number of viral copies(<138 copies/mL). A negative result must be combined with clinical observations, patient history, and epidemiological information. The expected result is Negative.  Fact Sheet for Patients:  EntrepreneurPulse.com.au  Fact Sheet for Healthcare Providers:  IncredibleEmployment.be  This test is no t yet approved or cleared by the [place] FDA and  has been authorized for detection and/or diagnosis of SARS-CoV-2 by FDA under an Emergency Use Authorization (EUA). This  EUA will remain  in effect (meaning this test can be used) for the duration of the COVID-19 declaration under Section 564(b)(1) of the Act, 21 U.S.C.section 360bbb-3(b)(1), unless the authorization is terminated  or revoked sooner.       Influenza A by PCR NEGATIVE NEGATIVE Final   Influenza B by PCR NEGATIVE NEGATIVE Final    Comment: (NOTE) The Xpert Xpress SARS-CoV-2/FLU/RSV plus assay is intended as an aid in the diagnosis of influenza from Nasopharyngeal swab specimens and should not be used as a sole basis for treatment. Nasal washings and aspirates are unacceptable for Xpert Xpress SARS-CoV-2/FLU/RSV testing.  Fact Sheet for Patients: EntrepreneurPulse.com.au  Fact Sheet for Healthcare Providers: IncredibleEmployment.be  This test is not yet approved or cleared by the [place] FDA and has been authorized for detection and/or diagnosis of SARS-CoV-2 by FDA under an Emergency Use Authorization (EUA).  This EUA will remain in effect (meaning this test can be used) for the duration of the COVID-19 declaration under Section 564(b)(1) of the Act, 21 U.S.C. section 360bbb-3(b)(1), unless the authorization is terminated or revoked.  Performed at [facility], [address] [place], [place] [address]   MRSA PCR Screening     Status: None   Collection Time: 09/10/20  2:43 AM   Specimen: Nasopharyngeal  Result Value Ref Range Status   MRSA by PCR NEGATIVE NEGATIVE Final    Comment:        The GeneXpert MRSA Assay (FDA approved for NASAL specimens only), is one component of a comprehensive MRSA colonization surveillance program. It is not intended to diagnose MRSA infection nor to guide or monitor treatment for MRSA infections. Performed at [facility], [address], [place], [place] [address]          Radiology Studies: CT ANGIO HEAD W OR WO CONTRAST  Result Date: 09/09/2020 CLINICAL DATA:  Neuro  deficit, acute stroke suspected. EXAM: CT ANGIOGRAPHY HEAD AND NECK TECHNIQUE: Multidetector CT imaging of the head and neck was performed using the standard protocol during bolus administration of intravenous contrast. Multiplanar CT image reconstructions and MIPs were obtained to evaluate the vascular anatomy. Carotid stenosis measurements (when applicable) are obtained utilizing NASCET criteria, using the distal internal carotid diameter as the denominator. CONTRAST:  48mL OMNIPAQUE IOHEXOL 350 MG/ML SOLN COMPARISON:  Same day CT head. FINDINGS: CTA NECK FINDINGS Aortic arch: Great vessel origins are patent. Right carotid system: . Intraluminal thrombus skull base is detailed below in the CTA head findings. Otherwise, no evidence of dissection, stenosis (50% or greater) or occlusion Left carotid system: No evidence of dissection, stenosis (50% or greater) or occlusion. Vertebral arteries: Codominant. No evidence of dissection, stenosis (50% or greater) or occlusion. Skeleton: Moderate multilevel degenerative disc disease. Other neck: No mass or adenopathy. Upper chest: Visualized lung apices are clear. Review of the MIP images confirms the above findings CTA HEAD FINDINGS Anterior circulation: Evidence of intraluminal thrombus within the internal carotid artery at the skull base with contrast surrounding the rounded filling defect (for example series 6, image 233). Thrombus extending into the proximal petrous internal carotid artery with resulting high-grade, nearly occlusive stenosis. More distal petrous and cavernous ICA is patent. Absent right A1 ACA, likely congenital. Otherwise, bilateral proximal MCAs and ACAs are patent. Multifocal mild stenosis of the right ACA. Posterior circulation: Small vertebrobasilar system with bilateral fetal type PCAs anatomic variant. The left intradural vertebral artery is small throughout its course and makes a small contribution to the basilar artery and the larger  contribution to the left PICA. Bilateral posterior cerebral arteries are patent without evidence of proximal hemodynamically significant stenosis. No aneurysm. Venous sinuses: As permitted by contrast timing, patent. Review of the MIP images confirms the above findings IMPRESSION: 1. Findings concerning for intraluminal thrombus in the right internal carotid artery at the skull base, extending into the petrous internal carotid artery with high-grade (near occlusive) stenosis with more distal opacification of the ICA intracranially. While indeterminate in etiology, findings could potentially be secondary to dissection given the patient's relative lack of atherosclerosis elsewhere. Catheter arteriogram could further characterize if clinically indicated. 2. Otherwise, no significant proximal stenosis in the head or neck. Findings discussed with Dr. Amie Portland, MD via telephone at 5:14 PM. Electronically Signed   By: Margaretha Sheffield MD   On: 09/09/2020 17:21   CT HEAD WO CONTRAST  Result Date: 09/09/2020  CLINICAL DATA:  Acute neuro deficit. EXAM: CT HEAD WITHOUT CONTRAST TECHNIQUE: Contiguous axial images were obtained from the base of the skull through the vertex without intravenous contrast. COMPARISON:  CT head 04/29/2010 FINDINGS: Brain: Cortically based hypodensity in the high right parietal lobe is ill-defined and appears acute. This was not present previously. No other areas of abnormality. No hemorrhage. Ventricle size normal. Vascular: Negative for hyperdense vessel Skull: Negative Sinuses/Orbits: Mucosal edema paranasal sinuses.  No orbital lesion. Other: None IMPRESSION: Cortically based hypodensity in the high right parietal lobe. This has the appearance of acute or subacute infarct. No acute hemorrhage. These results were called by telephone at the time of interpretation on 09/09/2020 at 2:36 pm to provider [facility] , who verbally acknowledged these results. Electronically Signed   By: Franchot Gallo M.D.   On: 09/09/2020 14:36   CT ANGIO NECK W OR WO CONTRAST  Result Date: 09/09/2020 CLINICAL DATA:  Neuro deficit, acute stroke suspected. EXAM: CT ANGIOGRAPHY HEAD AND NECK TECHNIQUE: Multidetector CT imaging of the head and neck was performed using the standard protocol during bolus administration of intravenous contrast. Multiplanar CT image reconstructions and MIPs were obtained to evaluate the vascular anatomy. Carotid stenosis measurements (when applicable) are obtained utilizing NASCET criteria, using the distal internal carotid diameter as the denominator. CONTRAST:  12mL OMNIPAQUE IOHEXOL 350 MG/ML SOLN COMPARISON:  Same day CT head. FINDINGS: CTA NECK FINDINGS Aortic arch: Great vessel origins are patent. Right carotid system: . Intraluminal thrombus skull base is detailed below in the CTA head findings. Otherwise, no evidence of dissection, stenosis (50% or greater) or occlusion Left carotid system: No evidence of dissection, stenosis (50% or greater) or occlusion. Vertebral arteries: Codominant. No evidence of dissection, stenosis (50% or greater) or occlusion. Skeleton: Moderate multilevel degenerative disc disease. Other neck: No mass or adenopathy. Upper chest: Visualized lung apices are clear. Review of the MIP images confirms the above findings CTA HEAD FINDINGS Anterior circulation: Evidence of intraluminal thrombus within the internal carotid artery at the skull base with contrast surrounding the rounded filling defect (for example series 6, image 233). Thrombus extending into the proximal petrous internal carotid artery with resulting high-grade, nearly occlusive stenosis. More distal petrous and cavernous ICA is patent. Absent right A1 ACA, likely congenital. Otherwise, bilateral proximal MCAs and ACAs are patent. Multifocal mild stenosis of the right ACA. Posterior circulation: Small vertebrobasilar system with bilateral fetal type PCAs anatomic variant. The left intradural  vertebral artery is small throughout its course and makes a small contribution to the basilar artery and the larger contribution to the left PICA. Bilateral posterior cerebral arteries are patent without evidence of proximal hemodynamically significant stenosis. No aneurysm. Venous sinuses: As permitted by contrast timing, patent. Review of the MIP images confirms the above findings IMPRESSION: 1. Findings concerning for intraluminal thrombus in the right internal carotid artery at the skull base, extending into the petrous internal carotid artery with high-grade (near occlusive) stenosis with more distal opacification of the ICA intracranially. While indeterminate in etiology, findings could potentially be secondary to dissection given the patient's relative lack of atherosclerosis elsewhere. Catheter arteriogram could further characterize if clinically indicated. 2. Otherwise, no significant proximal stenosis in the head or neck. Findings discussed with Dr. Amie Portland, MD via telephone at 5:14 PM. Electronically Signed   By: Margaretha Sheffield MD   On: 09/09/2020 17:21   MR BRAIN W WO CONTRAST  Result Date: 09/09/2020 CLINICAL DATA:  Neuro deficit, acute stroke suspected. EXAM:  MRI HEAD WITHOUT AND WITH CONTRAST TECHNIQUE: Multiplanar, multiecho pulse sequences of the brain and surrounding structures were obtained without and with intravenous contrast. CONTRAST:  45mL GADAVIST GADOBUTROL 1 MMOL/ML IV SOLN COMPARISON:  Same day CT exams. FINDINGS: Brain: Acute right parieto-occipital cortical infarct. Associated edema without mass effect. No acute hemorrhage. No midline shift. Basal cisterns are patent. No hydrocephalus. No extra-axial fluid collection. No abnormal enhancement. Vascular: Poor flow void within the right ICA at the skull base with filling defect on postcontrast imaging, consistent with findings described on same day CTA. Arachnoid granulations in the posterior aspect of the superior sagittal  sinus and the right transverse sinus. Skull and upper cervical spine: Normal marrow signal. Sinuses/Orbits: Scattered mucosal thickening of ethmoid air cells and frontal sinuses. Other: No mastoid effusions. IMPRESSION: 1. Acute right parieto-occipital cortical infarct. Associated edema without mass effect. 2. See same day CTA for characterization of vascular findings. 3. Frontoethmoidal paranasal sinus mucosal thickening. Electronically Signed   By: Margaretha Sheffield MD   On: 09/09/2020 19:39   ECHOCARDIOGRAM COMPLETE  Result Date: 09/10/2020    ECHOCARDIOGRAM REPORT   Patient Name:   JASYAH THEURER Date of Exam: 09/10/2020 Medical Rec #:  [id number]           Height:       72.0 in Accession #:    [id number]          Weight:       214.7 lb Date of Birth:  1951/01/23          BSA:          2.196 m Patient Age:    14 years            BP:           142/73 mmHg Patient Gender: M                   HR:           67 bpm. Exam Location:  [facility] Procedure: 2D Echo, Color Doppler, Cardiac Doppler and Strain Analysis Indications:     I63.9 Stroke  History:         Patient has no prior history of Echocardiogram examinations.                  Signs/Symptoms:Headache. Asthma.  Sonographer:     Charmayne Sheer RDCS (AE) Referring Phys:  [id number] Collier Bullock Diagnosing Phys: Bartholome Bill MD  Sonographer Comments: Suboptimal parasternal window. Global longitudinal strain was attempted. IMPRESSIONS  1. Left ventricular ejection fraction, by estimation, is 60 to 65%. The left ventricle has normal function. The left ventricle has no regional wall motion abnormalities. Left ventricular diastolic parameters were normal.  2. Right ventricular systolic function is normal. The right ventricular size is normal.  3. The mitral valve is grossly normal. No evidence of mitral valve regurgitation.  4. The aortic valve is grossly normal. Aortic valve regurgitation is not visualized. FINDINGS  Left Ventricle: Left ventricular ejection fraction,  by estimation, is 60 to 65%. The left ventricle has normal function. The left ventricle has no regional wall motion abnormalities. The left ventricular internal cavity size was normal in size. There is  borderline left ventricular hypertrophy. Left ventricular diastolic parameters were normal. Right Ventricle: The right ventricular size is normal. No increase in right ventricular wall thickness. Right ventricular systolic function is normal. Left Atrium: Left atrial size was normal in size. Right Atrium: Right atrial size was normal in  size. Pericardium: There is no evidence of pericardial effusion. Mitral Valve: The mitral valve is grossly normal. No evidence of mitral valve regurgitation. MV peak gradient, 3.0 mmHg. The mean mitral valve gradient is 1.0 mmHg. Tricuspid Valve: The tricuspid valve is not well visualized. Tricuspid valve regurgitation is trivial. Aortic Valve: The aortic valve is grossly normal. Aortic valve regurgitation is not visualized. Aortic valve mean gradient measures 4.0 mmHg. Aortic valve peak gradient measures 6.9 mmHg. Aortic valve area, by VTI measures 2.49 cm. Pulmonic Valve: The pulmonic valve was not well visualized. Pulmonic valve regurgitation is trivial. Aorta: The aortic root is normal in size and structure. IAS/Shunts: The interatrial septum was not well visualized.  LEFT VENTRICLE PLAX 2D LVIDd:         4.80 cm  Diastology LVIDs:         3.10 cm  LV e' medial:    10.40 cm/s LV PW:         1.00 cm  LV E/e' medial:  7.9 LV IVS:        0.80 cm  LV e' lateral:   14.50 cm/s LVOT diam:     2.10 cm  LV E/e' lateral: 5.6 LV SV:         65 LV SV Index:   30 LVOT Area:     3.46 cm  RIGHT VENTRICLE RV Basal diam:  2.90 cm LEFT ATRIUM             Index       RIGHT ATRIUM           Index LA diam:        3.80 cm 1.73 cm/m  RA Area:     14.50 cm LA Vol (A2C):   34.8 ml 15.85 ml/m RA Volume:   36.70 ml  16.71 ml/m LA Vol (A4C):   41.2 ml 18.76 ml/m LA Biplane Vol: 39.0 ml 17.76 ml/m   AORTIC VALVE                   PULMONIC VALVE AV Area (Vmax):    2.64 cm    PV Vmax:       1.03 m/s AV Area (Vmean):   2.51 cm    PV Vmean:      67.500 cm/s AV Area (VTI):     2.49 cm    PV VTI:        0.167 m AV Vmax:           131.00 cm/s PV Peak grad:  4.2 mmHg AV Vmean:          90.500 cm/s PV Mean grad:  2.0 mmHg AV VTI:            0.262 m AV Peak Grad:      6.9 mmHg AV Mean Grad:      4.0 mmHg LVOT Vmax:         99.90 cm/s LVOT Vmean:        65.700 cm/s LVOT VTI:          0.188 m LVOT/AV VTI ratio: 0.72  AORTA Ao Root diam: 3.50 cm MITRAL VALVE MV Area (PHT): 3.81 cm    SHUNTS MV Area VTI:   2.66 cm    Systemic VTI:  0.19 m MV Peak grad:  3.0 mmHg    Systemic Diam: 2.10 cm MV Mean grad:  1.0 mmHg MV Vmax:       0.87 m/s MV Vmean:  54.8 cm/s MV Decel Time: 199 msec MV E velocity: 81.80 cm/s MV A velocity: 79.70 cm/s MV E/A ratio:  1.03 Bartholome Bill MD Electronically signed by Bartholome Bill MD Signature Date/Time: 09/10/2020/4:56:24 PM    Final         Scheduled Meds: . aspirin  81 mg Oral Daily  . atorvastatin  80 mg Oral Daily  . Chlorhexidine Gluconate Cloth  6 each Topical Q0600  . fluorescein  1 strip Both Eyes Once  . montelukast  10 mg Oral QHS  . pantoprazole  40 mg Oral Daily  . sodium chloride flush  3 mL Intravenous Once  . tetracaine  2 drop Both Eyes Once  . triamcinolone  1 spray Nasal Daily   Continuous Infusions: . heparin 800 Units/hr (09/11/20 0610)     LOS: 2 days    Time spent: 30 mins     Wyvonnia Dusky, MD [facility] Pager [phone]  If 7PM-7AM, please contact night-coverage 09/11/2020, 8:01 AM

## 2020-09-11 NOTE — Progress Notes (Signed)
Neurology Progress Note   S:// Seen and examined.  Reports continuing headache but less in intensity than yesterday.  Continues to report headache more over the right forehead and over the maxillary sinus area on the right.  O:// Current vital signs: BP (!) 142/83   Pulse (!) 58   Temp 98.3 F (36.8 C) (Oral)   Resp 17   Ht 6' (1.829 m)   Wt 97.4 kg   SpO2 96%   BMI 29.12 kg/m  Vital signs in last 24 hours: Temp:  [98.3 F (36.8 C)-98.7 F (37.1 C)] 98.3 F (36.8 C) (04/21 0200) Pulse Rate:  [58-77] 58 (04/21 0800) Resp:  [10-19] 17 (04/21 0800) BP: (112-142)/(56-85) 142/83 (04/21 0800) SpO2:  [95 %-98 %] 96 % (04/21 0800) GENERAL: Awake, alert in NAD HEENT: - Normocephalic and atraumatic, dry mm, no LN++, no Thyromegally, continues to have palpable tenderness over the right frontal and maxillary sinuses. LUNGS - Clear to auscultation bilaterally with no wheezes CV - S1S2 RRR, no m/r/g, equal pulses bilaterally. ABDOMEN - Soft, nontender, nondistended with normoactive BS Ext: warm, well perfused, no edema NEURO:  Mental Status: Awake alert oriented x3 Speech and language: No evidence of dysarthria or aphasia Cranial nerves: Right pupil is 2 mm sluggishly reactive, left pupil is 3 mm normally reactive, no visual field cuts, no facial asymmetry, facial sensation intact, tongue and palate midline. Motor exam: 5/5 in all fours without vertical drift with normal tone and normal range of motion. Sensation intact light touch without extinction Coordination: Intact finger-nose-finger testing bilaterally Gait testing deferred NIH stroke scale remains 0 Unchanged from yesterday  Medications  Current Facility-Administered Medications:  .  acetaminophen (TYLENOL) tablet 650 mg, 650 mg, Oral, Q4H PRN, 650 mg at 09/11/20 0516 **OR** acetaminophen (TYLENOL) 160 MG/5ML solution 650 mg, 650 mg, Per Tube, Q4H PRN **OR** acetaminophen (TYLENOL) suppository 650 mg, 650 mg, Rectal, Q4H PRN,  Agbata, Tochukwu, MD .  aspirin chewable tablet 81 mg, 81 mg, Oral, Daily, Amie Portland, MD, 81 mg at 09/10/20 0837 .  atorvastatin (LIPITOR) tablet 80 mg, 80 mg, Oral, Daily, Amie Portland, MD .  Chlorhexidine Gluconate Cloth 2 % PADS 6 each, 6 each, Topical, Q0600, Agbata, Tochukwu, MD, 6 each at 09/11/20 0522 .  fluorescein ophthalmic strip 1 strip, 1 strip, Both Eyes, Once, Nance Pear, MD .  heparin ADULT infusion 100 units/mL (25000 units/241mL), 800 Units/hr, Intravenous, Continuous, Belue, Alver Sorrow, RPH, Last Rate: 8 mL/hr at 09/11/20 0610, 800 Units/hr at 09/11/20 0610 .  montelukast (SINGULAIR) tablet 10 mg, 10 mg, Oral, QHS, Agbata, Tochukwu, MD, 10 mg at 09/10/20 2136 .  pantoprazole (PROTONIX) EC tablet 40 mg, 40 mg, Oral, Daily, Agbata, Tochukwu, MD, 40 mg at 09/10/20 0838 .  prochlorperazine (COMPAZINE) injection 10 mg, 10 mg, Intravenous, Once, Amie Portland, MD .  prochlorperazine (COMPAZINE) tablet 5 mg, 5 mg, Oral, Q6H PRN, Amie Portland, MD .  sodium chloride flush (NS) 0.9 % injection 3 mL, 3 mL, Intravenous, Once, Nance Pear, MD .  tetracaine (PONTOCAINE) 0.5 % ophthalmic solution 2 drop, 2 drop, Both Eyes, Once, Nance Pear, MD .  triamcinolone (NASACORT) nasal inhaler 1 spray, 1 spray, Nasal, Daily, Agbata, Tochukwu, MD, 1 spray at 09/10/20 0839 Labs CBC    Component Value Date/Time   WBC 6.5 09/11/2020 0349   RBC 5.41 09/11/2020 0349   HGB 13.2 09/11/2020 0349   HGB 12.9 (L) 12/28/2016 1446   HCT 40.5 09/11/2020 0349   HCT 39.3 12/28/2016 1446  PLT 178 09/11/2020 0349   PLT 171 12/28/2016 1446   MCV 74.9 (L) 09/11/2020 0349   MCV 74 (L) 12/28/2016 1446   MCH 24.4 (L) 09/11/2020 0349   MCHC 32.6 09/11/2020 0349   RDW 13.5 09/11/2020 0349   RDW 14.4 12/28/2016 1446   LYMPHSABS 0.7 09/09/2020 1354   LYMPHSABS 0.9 12/28/2016 1446   MONOABS 0.4 09/09/2020 1354   EOSABS 0.0 09/09/2020 1354   EOSABS 0.2 12/28/2016 1446   BASOSABS 0.0 09/09/2020  1354   BASOSABS 0.0 12/28/2016 1446    CMP     Component Value Date/Time   NA 139 09/11/2020 0349   K 3.6 09/11/2020 0349   CL 104 09/11/2020 0349   CO2 27 09/11/2020 0349   GLUCOSE 109 (H) 09/11/2020 0349   BUN 18 09/11/2020 0349   CREATININE 0.90 09/11/2020 0349   CALCIUM 8.9 09/11/2020 0349   PROT 7.5 09/09/2020 1354   ALBUMIN 4.0 09/09/2020 1354   AST 21 09/09/2020 1354   ALT 21 09/09/2020 1354   ALKPHOS 63 09/09/2020 1354   BILITOT 0.9 09/09/2020 1354   GFRNONAA >60 09/11/2020 0349   GFRAA >60 06/25/2018 1111    Lipid Panel     Component Value Date/Time   CHOL 256 (H) 09/10/2020 0429   TRIG 120 09/10/2020 0429   HDL 78 09/10/2020 0429   CHOLHDL 3.3 09/10/2020 0429   VLDL 24 09/10/2020 0429   LDLCALC 154 (H) 09/10/2020 0429   A1c 5.6 LDL 154 2D LVEF 60-65%. No RWMA, LA size normal. IAS not well visualized.  Imaging I have reviewed images in epic and the results pertinent to this consultation are: CT head reviewed: Right parietal hypodensity, no bleed. MRI of the brain with and without contrast reviewed: Confirms the right parietal hypodensity seen on CT scan to be consistent with an acute ischemic stroke.  Scattered mucosal thickening of the ethmoidal air cells and frontal sinuses.  Poorly visualized right internal carotid flow-void at the skull base on T2 images. CTA head and neck reviewed: Concerning for intraluminal thrombus in the right internal carotid artery at the skull base extending into the petrous internal carotid artery with high-grade/near occlusive stenosis with more distal opacification of the ICA intracranially- could reflect a dissection as there is lack of atherosclerosis elsewhere.  Otherwise no proximal stenosis in the head and neck.  Assessment:  70 year old man with past medical history of prostate cancer, asthma and arthritis presented to the emergency room for right-sided headache of 2 days duration, centered around the right forehead and  right maxilla with pressure-like feeling with minimal relief with over-the-counter NSAIDs, noted to have an abnormal CT head finding of a right parietal hypodensity which was later verified to be an acute ischemic stroke in the right parietal lobe. 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  He was started on heparin drip yesterday. Continues to do well.  On examination, NIH stroke scale remains 0.  Headache is better but not completely resolved.  Impression:  Acute ischemic stroke-likely secondary to right ICA thrombus versus dissection  Headache - sinus headache vs. Hemicrania continua vs headache secondary to dissection  Recommendations:  Frequent neurochecks and telemetry  Continue heparin drip for another 24 hours.  After that we will transition him to dual antiplatelets given that the dissection extends intracranially  Continue aspirin 81  High-dose statin for goal LDL of less than 70.  His current LDL is 154.  A1c and 2D echo unremarkable  In case of any neurological worsening, repeat brain imaging in the form of CTA head and neck and MRI of the brain as well as consideration for transfer to a tertiary center for possible catheter angiogram still remains a consideration.  In 2 to 3 months: Repeat CTA head and outpatient neurology follow-up  PT OT  Symptomatic management of the headache with Tylenol.  Ordering Compazine one dose IV and then PO doses PRN q6h  Can try IV Solumedrol 500 mg x1 if still has headache. Low dose opiates PRN might be options too.  Plan relayed to the primary hospitalist Dr. Jimmye Norman via secure text  -- Amie Portland, MD Neurologist [facility] Pager: [phone]

## 2020-09-11 NOTE — Evaluation (Signed)
Physical Therapy Evaluation Patient Details Name: William Hicks MRN: [id number] DOB: 1950-09-01 Today's Date: 09/11/2020   History of Present Illness  Pt is a 70 yo male that presented to ED with complaints of blurry vision, dizziness, R sided headache. Work up showed  CTA of head/neck concerning for acute CVA (right parieto-occipital cortical infarct), concerning for intraluminal thombus/dissection. PMH of prostate cancer, asthma, GERD, kidney stones.    Clinical Impression  Pt A&Ox4, reported very mild remnants of his headache, premedicated prior to session. The patient reported at baseline his is independent, retired, lives with family and no falls in the last year.  The patient demonstrated bed mobility modI. Upon assessment cranial nerves intact, UE and LE strong and symmetrical, no deficits in light touch sensation or coordination. He was able to sit <> stand with supervision, and ambulated ~183ft with CGA and IV pole. Pt without gait deviations besides mildly decreased gait velocity, endorsed feeling a little "whoozy" Iv pole provided for unilateral support and pt appeared to be more comfortable. Pt/family educated on potential use of SPC at home as needed. The patient demonstrated and reported return to baseline level of functioning but PT to follow acutely this admission to ensure continued mobility and balance assessment if needed. No PT follow up recommendations at this time.     Follow Up Recommendations No PT follow up    Equipment Recommendations  None recommended by PT    Recommendations for Other Services       Precautions / Restrictions Precautions Precautions: Fall Precaution Comments: low fall, hep drip Restrictions Weight Bearing Restrictions: No      Mobility  Bed Mobility Overal bed mobility: Modified Independent                  Transfers Overall transfer level: Needs assistance   Transfers: Sit to/from Stand Sit to Stand: Supervision             Ambulation/Gait Ambulation/Gait assistance: Min guard   Assistive device: IV Pole       General Gait Details: pt without gait deviations besides mildly decreased gait velocity, endorsed feeling a little "whoozy" Iv pole provided for unilateral support and pt appeared to be more comfortable  Stairs            Wheelchair Mobility    Modified Rankin (Stroke Patients Only)       Balance Overall balance assessment: Needs assistance Sitting-balance support: Feet supported Sitting balance-Leahy Scale: Normal       Standing balance-Leahy Scale: Good Standing balance comment: pt more comfortable with at least unilateral support during dynamic tasks/ambulation                             Pertinent Vitals/Pain Pain Assessment: 0-10 Pain Score: 1  Pain Location: headache, but recent pain medication significantly decreased his pain Pain Descriptors / Indicators: Aching Pain Intervention(s): Monitored during session;Repositioned;Premedicated before session    Home Living Family/patient expects to be discharged to:: Private residence Living Arrangements: Spouse/significant other Available Help at Discharge: Family Type of Home: House Home Access: Stairs to enter Entrance Stairs-Rails: Right;Left;Can reach both Technical brewer of Steps: 5 Home Layout: One level Home Equipment: Cane - single point      Prior Function Level of Independence: Independent         Comments: retired, no falls in the last year     Hand Dominance   Dominant Hand: Right    Extremity/Trunk  Assessment   Upper Extremity Assessment Upper Extremity Assessment: Overall WFL for tasks assessed (no sensation or coordination deficits noted strength 5/5)    Lower Extremity Assessment Lower Extremity Assessment: Overall WFL for tasks assessed (no sensation or coordination deficits noted strength 5/5)    Cervical / Trunk Assessment Cervical / Trunk Assessment:  Normal  Communication   Communication: No difficulties  Cognition Arousal/Alertness: Awake/alert Behavior During Therapy: WFL for tasks assessed/performed Overall Cognitive Status: Within Functional Limits for tasks assessed                                        General Comments      Exercises     Assessment/Plan    PT Assessment Patent does not need any further PT services  PT Problem List         PT Treatment Interventions      PT Goals (Current goals can be found in the Care Plan section)       Frequency     Barriers to discharge        Co-evaluation               AM-PAC PT "6 Clicks" Mobility  Outcome Measure Help needed turning from your back to your side while in a flat bed without using bedrails?: None Help needed moving from lying on your back to sitting on the side of a flat bed without using bedrails?: None Help needed moving to and from a bed to a chair (including a wheelchair)?: None Help needed standing up from a chair using your arms (e.g., wheelchair or bedside chair)?: None Help needed to walk in hospital room?: None Help needed climbing 3-5 steps with a railing? : None 6 Click Score: 24    End of Session Equipment Utilized During Treatment: Gait belt Activity Tolerance: Patient tolerated treatment well Patient left: in chair;with family/visitor present;with call bell/phone within reach Nurse Communication: Mobility status      Time: 0940-7680 PT Time Calculation (min) (ACUTE ONLY): 23 min   Charges:   PT Evaluation $PT Eval Low Complexity: 1 Low PT Treatments $Gait Training: 8-22 mins        Lieutenant Diego PT, DPT 2:22 PM,09/11/20

## 2020-09-11 NOTE — Consult Note (Signed)
[place] for heparin infusion Indication: stroke  Allergies  Allergen Reactions  . Aspirin Diarrhea and Nausea And Vomiting    Patient Measurements: Height: 6' (182.9 cm) Weight: 97.4 kg (214 lb 11.7 oz) IBW/kg (Calculated) : 77.6 Heparin Dosing Weight: 97.5 kg   Labs: Recent Labs    09/09/20 1354 09/10/20 0215 09/10/20 0429 09/10/20 0830 09/11/20 0349 09/11/20 1104 09/11/20 1733  HGB 13.3  --  12.7*  --  13.2  --   --   HCT 40.2  --  38.9*  --  40.5  --   --   PLT 184  --  172  --  178  --   --   APTT 25  --   --   --   --   --   --   LABPROT 12.6  --   --   --   --   --   --   INR 0.9  --   --   --   --   --   --   HEPARINUNFRC  --    < >  --    < > 0.79* 0.42 0.27*  CREATININE 0.92  --   --   --  0.90  --   --    < > = values in this interval not displayed.    Estimated Creatinine Clearance: 96.3 mL/min (by C-G formula based on SCr of 0.9 mg/dL).   Medical History: Past Medical History:  Diagnosis Date  . Arthritis   . Asthma   . Epistaxis   . GERD (gastroesophageal reflux disease)   . Kidney stones   . Prostate cancer ([place])   . Sinusitis     Medications:  No prior anticoagulation noted  Assessment: 69 presented to the emergency room for evaluation of right-sided headache. On Findings of CTA of head/neck concerning for acute CVA, concerning for intraluminal thombus/dissection. Pharmacy has been consulted for initiation and management of heparin infusion via stroke protocol. Baseline CBC, aPTT, PT, and INR WNL.    Goal of Therapy:  Heparin level 0.3-0.5 units/ml Monitor platelets by anticoagulation protocol: Yes   0420 0215 HL 0.38, therapeutic 0420 0830 HL 0.52, slightly supratherapeutic  0420 1448 HL 0.49, therapeutic @ 1300 units/hr 0420 2105 HL 0.70, supratherapeutic  0421 0349 HL 0.79, supratherapeutic 0421 1104 HL 0.42, therapeutic x 1 0421 1733 HL 0.27, subtherapeutic   Plan:   HL subtherapeutic,  will increase heparin rate to 1000 units/hr  Recheck HL at 0100 on 4/22  Targeting lower end of heparin therapeutic range: 0.3-0.5   CBC daily  Per neurology: plan to continue heparin infusion x 24 hrs  Pearla Dubonnet, PharmD Clinical Pharmacist 09/11/2020 6:34 PM

## 2020-09-11 NOTE — Consult Note (Signed)
[place] for heparin infusion Indication: stroke  Allergies  Allergen Reactions  . Aspirin Diarrhea and Nausea And Vomiting    Patient Measurements: Height: 6' (182.9 cm) Weight: 97.4 kg (214 lb 11.7 oz) IBW/kg (Calculated) : 77.6 Heparin Dosing Weight: 97.5 kg   Labs: Recent Labs    09/09/20 1354 09/10/20 0215 09/10/20 0429 09/10/20 0830 09/10/20 1448 09/10/20 2105 09/11/20 0349  HGB 13.3  --  12.7*  --   --   --  13.2  HCT 40.2  --  38.9*  --   --   --  40.5  PLT 184  --  172  --   --   --  178  APTT 25  --   --   --   --   --   --   LABPROT 12.6  --   --   --   --   --   --   INR 0.9  --   --   --   --   --   --   HEPARINUNFRC  --    < >  --    < > 0.49 0.70 0.79*  CREATININE 0.92  --   --   --   --   --   --    < > = values in this interval not displayed.    Estimated Creatinine Clearance: 94.2 mL/min (by C-G formula based on SCr of 0.92 mg/dL).   Medical History: Past Medical History:  Diagnosis Date  . Arthritis   . Asthma   . Epistaxis   . GERD (gastroesophageal reflux disease)   . Kidney stones   . Prostate cancer ([place])   . Sinusitis     Medications:  No prior anticoagulation noted  Assessment: 69 presented to the emergency room for evaluation of right-sided headache. On Findings of CTA of head/neck concerning for acute CVA, concerning for intraluminal thombus/dissection. Pharmacy has been consulted for initiation and management of heparin infusion via stroke protocol. Baseline CBC, aPTT, PT, and INR WNL.    Goal of Therapy:  Heparin level 0.3-0.5 units/ml Monitor platelets by anticoagulation protocol: Yes   0420 0215 HL 0.38, therapeutic 0420 0830 HL 0.52, slightly supratherapeutic  0420 1448 HL 0.49, therapeutic @ 1300 units/hr 0420 2105 HL 0.70, supratherapeutic  0421 0349 HL 0.79, supratherapeutic   Plan:   HL supratherapeutic, will decrease heparin rate to 800 units/hr  Recheck HL 6 hours  following rate change  Targeting lower end of heparin therapeutic range: 0.3-0.5   CBC daily  Per neurology: plan to continue heparin infusion 2-3 days  Renda Rolls, PharmD, [facility] 09/11/2020 4:57 AM

## 2020-09-11 NOTE — Consult Note (Signed)
[facility] for heparin infusion Indication: stroke  Allergies  Allergen Reactions  . Aspirin Diarrhea and Nausea And Vomiting    Patient Measurements: Height: 6' (182.9 cm) Weight: 97.4 kg (214 lb 11.7 oz) IBW/kg (Calculated) : 77.6 Heparin Dosing Weight: 97.5 kg   Labs: Recent Labs    09/09/20 1354 09/10/20 0215 09/10/20 0429 09/10/20 0830 09/10/20 2105 09/11/20 0349 09/11/20 1104  HGB 13.3  --  12.7*  --   --  13.2  --   HCT 40.2  --  38.9*  --   --  40.5  --   PLT 184  --  172  --   --  178  --   APTT 25  --   --   --   --   --   --   LABPROT 12.6  --   --   --   --   --   --   INR 0.9  --   --   --   --   --   --   HEPARINUNFRC  --    < >  --    < > 0.70 0.79* 0.42  CREATININE 0.92  --   --   --   --  0.90  --    < > = values in this interval not displayed.    Estimated Creatinine Clearance: 96.3 mL/min (by C-G formula based on SCr of 0.9 mg/dL).   Medical History: Past Medical History:  Diagnosis Date  . Arthritis   . Asthma   . Epistaxis   . GERD (gastroesophageal reflux disease)   . Kidney stones   . Prostate cancer ([facility])   . Sinusitis     Medications:  No prior anticoagulation noted  Assessment: William Hicks presented to the emergency room for evaluation of right-sided headache. On Findings of CTA of head/neck concerning for acute CVA, concerning for intraluminal thombus/dissection. Pharmacy has been consulted for initiation and management of heparin infusion via stroke protocol. Baseline CBC, aPTT, PT, and INR WNL.    Goal of Therapy:  Heparin level 0.3-0.5 units/ml Monitor platelets by anticoagulation protocol: Yes   0420 0215 HL 0.38, therapeutic 0420 0830 HL 0.52, slightly supratherapeutic  0420 1448 HL 0.49, therapeutic @ 1300 units/hr 0420 2105 HL 0.70, supratherapeutic  0421 0349 HL 0.79, supratherapeutic 0421 1104 HL 0.42, therapeutic x 1   Plan:   HL therapeutic, continue heparin rate at 800  units/hr  Recheck HL at 1700 to confirm  Targeting lower end of heparin therapeutic range: 0.3-0.5   CBC daily  Per neurology: plan to continue heparin infusion x 24 hrs  William Hicks, PharmD Clinical Pharmacist 09/11/2020 2:59 PM

## 2020-09-11 NOTE — Plan of Care (Signed)
  Problem: Education: Goal: Knowledge of disease or condition will improve Outcome: Progressing Goal: Knowledge of secondary prevention will improve Outcome: Progressing Goal: Knowledge of patient specific risk factors addressed and post discharge goals established will improve Outcome: Progressing   

## 2020-09-11 NOTE — Progress Notes (Signed)
OT Cancellation Note  Patient Details Name: JOMO FORAND MRN: [id number] DOB: 1950/09/02   Cancelled Treatment:    Reason Eval/Treat Not Completed: OT screened, no needs identified, will sign off.  Order received, chart reviewed. Pt reports baseline independence to perform ADL and mobility tasks and no strength, sensory, coordination, cognitive, or visual deficits reported at this time. No skilled OT needs identified. Will sign off. Please re-consult if additional OT needs arise.  Fredirick Maudlin, OTR/L [place]

## 2020-09-12 DIAGNOSIS — E876 Hypokalemia: Secondary | ICD-10-CM

## 2020-09-12 LAB — CBC
HCT: 39.3 % (ref 39.0–52.0)
Hemoglobin: 12.8 g/dL — ABNORMAL LOW (ref 13.0–17.0)
MCH: 24.7 pg — ABNORMAL LOW (ref 26.0–34.0)
MCHC: 32.6 g/dL (ref 30.0–36.0)
MCV: 75.7 fL — ABNORMAL LOW (ref 80.0–100.0)
Platelets: 175 10*3/uL (ref 150–400)
RBC: 5.19 MIL/uL (ref 4.22–5.81)
RDW: 13.4 % (ref 11.5–15.5)
WBC: 7.1 10*3/uL (ref 4.0–10.5)
nRBC: 0 % (ref 0.0–0.2)

## 2020-09-12 LAB — HEPARIN LEVEL (UNFRACTIONATED)
Heparin Unfractionated: 0.3 IU/mL (ref 0.30–0.70)
Heparin Unfractionated: 0.37 IU/mL (ref 0.30–0.70)

## 2020-09-12 LAB — BASIC METABOLIC PANEL
Anion gap: 9 (ref 5–15)
BUN: 17 mg/dL (ref 8–23)
CO2: 26 mmol/L (ref 22–32)
Calcium: 8.9 mg/dL (ref 8.9–10.3)
Chloride: 104 mmol/L (ref 98–111)
Creatinine, Ser: 1.01 mg/dL (ref 0.61–1.24)
GFR, Estimated: >60 mL/min (ref >60)
Glucose, Bld: 153 mg/dL — ABNORMAL HIGH (ref 70–99)
Potassium: 3.2 mmol/L — ABNORMAL LOW (ref 3.5–5.1)
Sodium: 139 mmol/L (ref 135–145)

## 2020-09-12 LAB — MAGNESIUM: Magnesium: 1.9 mg/dL (ref 1.7–2.4)

## 2020-09-12 MED ORDER — TRAMADOL HCL 50 MG PO TABS: 50.0000 mg | ORAL_TABLET | Freq: Four times a day (QID) | ORAL | Status: DC | PRN

## 2020-09-12 MED ORDER — SODIUM CHLORIDE 0.9 % IV SOLN
500.0000 mg | Freq: Once | INTRAVENOUS | Status: AC
  Administered 2020-09-12: 500 mg via INTRAVENOUS
  Filled 2020-09-12: qty 4

## 2020-09-12 MED ORDER — CLOPIDOGREL BISULFATE 75 MG PO TABS
75.0000 mg | ORAL_TABLET | Freq: Every day | ORAL | Status: DC
  Administered 2020-09-13: 75 mg via ORAL
  Filled 2020-09-12: qty 1

## 2020-09-12 MED ORDER — POTASSIUM CHLORIDE CRYS ER 20 MEQ PO TBCR
40.0000 meq | EXTENDED_RELEASE_TABLET | Freq: Once | ORAL | Status: AC
  Administered 2020-09-12: 40 meq via ORAL
  Filled 2020-09-12: qty 2

## 2020-09-12 MED ORDER — CLOPIDOGREL BISULFATE 75 MG PO TABS
300.0000 mg | ORAL_TABLET | Freq: Once | ORAL | Status: AC
  Administered 2020-09-12: 300 mg via ORAL
  Filled 2020-09-12: qty 4

## 2020-09-12 MED ORDER — DOCUSATE SODIUM 100 MG PO CAPS
200.0000 mg | ORAL_CAPSULE | Freq: Two times a day (BID) | ORAL | Status: DC
  Administered 2020-09-12 – 2020-09-13 (×3): 200 mg via ORAL
  Filled 2020-09-12 (×3): qty 2

## 2020-09-12 MED ORDER — POTASSIUM CHLORIDE CRYS ER 20 MEQ PO TBCR
20.0000 meq | EXTENDED_RELEASE_TABLET | Freq: Once | ORAL | Status: AC
  Administered 2020-09-12: 20 meq via ORAL
  Filled 2020-09-12: qty 1

## 2020-09-12 MED ORDER — POLYETHYLENE GLYCOL 3350 17 G PO PACK
17.0000 g | PACK | Freq: Every day | ORAL | Status: DC
  Administered 2020-09-12 – 2020-09-13 (×2): 17 g via ORAL
  Filled 2020-09-12 (×2): qty 1

## 2020-09-12 NOTE — Plan of Care (Signed)
Late entry for goals. Goals appropriate following PT evaluation yesterday 09/11/20.   Lavone Nian, PT, DPT 09/12/20, 1:43 PM  Problem: Acute Rehab PT Goals(only PT should resolve) Goal: Pt Will Ambulate Flowsheets (Taken 09/12/2020 1342) Pt will Ambulate:  > 125 feet  with least restrictive assistive device  with modified independence Goal: Pt Will Go Up/Down Stairs Flowsheets (Taken 09/12/2020 1342) Pt will Go Up / Down Stairs:  3-5 stairs  with rail(s)  with modified independence  with least restrictive assistive device

## 2020-09-12 NOTE — Progress Notes (Signed)
Physical Therapy Treatment Patient Details Name: William Hicks MRN: [id number] DOB: [birth year] Today's Date: 09/12/2020    History of Present Illness Pt is a 70 yo male that presented to ED with complaints of blurry vision, dizziness, R sided headache. Work up showed  CTA of head/neck concerning for acute CVA (right parieto-occipital cortical infarct), concerning for intraluminal thombus/dissection. PMH of prostate cancer, asthma, GERD, kidney stones.    PT Comments    Pt seen for PT treatment with pt ambulating 1 lap around ICU independently without AD without LOB. Pt has B rails by stairs at home & no concerns re: stair negotiation upon d/c. PT educated pt & wife on risks of another stroke, to slowly increase activity, eat healthy diet, stay active, and take medications as prescribed by MD to reduce chances of another stroke in future. Encouraged them to speak with nurse/MD about specific medication questions. Pt demonstrates not further acute PT needs at this time. Will sign off. Please re-consult if new needs arise.   Pt is independent with mobility & has either met goals, or goals are adequate for d/c at this time.     Follow Up Recommendations  No PT follow up;Supervision - Intermittent  NO further acute PT needs     Equipment Recommendations  None recommended by PT    Recommendations for Other Services       Precautions / Restrictions Precautions Precautions: None Restrictions Weight Bearing Restrictions: No    Mobility  Bed Mobility Overal bed mobility: Modified Independent                  Transfers Overall transfer level: Independent   Transfers: Sit to/from Stand Sit to Stand: Independent            Ambulation/Gait Ambulation/Gait assistance: Independent Gait Distance (Feet): 200 Feet Assistive device: None Gait Pattern/deviations: WFL(Within Functional Limits) Gait velocity: slightly decreased       Stairs              Wheelchair Mobility    Modified Rankin (Stroke Patients Only)       Balance     Sitting balance-Leahy Scale: Normal       Standing balance-Leahy Scale: Good                              Cognition Arousal/Alertness: Awake/alert Behavior During Therapy: WFL for tasks assessed/performed Overall Cognitive Status: Within Functional Limits for tasks assessed                                        Exercises      General Comments General comments (skin integrity, edema, etc.): HR up to 112 bpm with gait      Pertinent Vitals/Pain Pain Assessment: Faces Pain Score: 5  Pain Location: HA Pain Descriptors / Indicators: Aching Pain Intervention(s): Monitored during session (reports he's premedicated)    Home Living                      Prior Function            PT Goals (current goals can now be found in the care plan section) Progress towards PT goals: Progressing toward goals    Frequency           PT Plan Frequency needs to be updated;Discharge plan needs  to be updated    Co-evaluation              AM-PAC PT "6 Clicks" Mobility   Outcome Measure  Help needed turning from your back to your side while in a flat bed without using bedrails?: None Help needed moving from lying on your back to sitting on the side of a flat bed without using bedrails?: None Help needed moving to and from a bed to a chair (including a wheelchair)?: None Help needed standing up from a chair using your arms (e.g., wheelchair or bedside chair)?: None Help needed to walk in hospital room?: None Help needed climbing 3-5 steps with a railing? : None 6 Click Score: 24    End of Session   Activity Tolerance: Patient tolerated treatment well Patient left: in bed;with call bell/phone within reach;with family/visitor present         Time: 0104-0459 PT Time Calculation (min) (ACUTE ONLY): 9 min  Charges:  $Therapeutic Activity: 8-22  mins                     Lavone Nian, PT, DPT 09/12/20, 1:46 PM    Waunita Schooner 09/12/2020, 1:43 PM

## 2020-09-12 NOTE — Plan of Care (Signed)
Being transferred to 2A - room 255. Report given to [place], [facility] RN.

## 2020-09-12 NOTE — Progress Notes (Signed)
PROGRESS NOTE    William Hicks  [id number] DOB: [birth year] DOA: 09/09/2020 PCP: Maude Leriche, PA-C    Assessment & Plan:   Principal Problem:   CVA (cerebral vascular accident) [facility]) Active Problems:   GERD (gastroesophageal reflux disease)   Malignant neoplasm of prostate ([place])   Cluster headache   Acute CVA (cerebrovascular accident) ([place])   CVA: CT scan of the head without contrast shows cortically based hypodensity in the high right parietal lobe.This has the appearance of acute or subacute infarct. No acute hemorrhage. CTA neck shows concern for intraluminal thrombus in right internal carotid at the skull base extending into the petrous internal carotid arterty w/ high grade (near occlusive) stenosis. D/c IV heparin drip and start plavix and continue on aspirin as per neuro. Needs a repeat CTA head in 2-3 months outpatient as per neuro. Neuro recs apprec    Headache: etiology unclear, tension vs cluster. High dose steroid x 1 as per neuro. Tramadol prn   Hypokalemia: KCl repleated. Will continue to monitor    DVT prophylaxis: SCDs  Code Status: full  Family Communication:  Disposition Plan: likely d/c back home   Level of care: Stepdown   Status is: Inpatient  Remains inpatient appropriate because:IV treatments appropriate due to intensity of illness or inability to take PO and Inpatient level of care appropriate due to severity of illness   Dispo: The patient is from: Home              Anticipated d/c is to: Home vs [facility]               Patient currently is not medically stable to d/c.   Difficult to place patient : unclear    Consultants:  Neuro  Procedures:    Antimicrobials:    Subjective: Pt c/o headache   Objective: Vitals:   09/12/20 0400 09/12/20 0500 09/12/20 0600 09/12/20 0716  BP: 134/88 (!) 116/92 134/81 129/69  Pulse: 63 78 68 87  Resp:  19 20 (!) 24  Temp:      TempSrc:      SpO2: 98% 98% 97% 99%  Weight:      Height:         Intake/Output Summary (Last 24 hours) at 09/12/2020 0828 Last data filed at 09/12/2020 0700 Gross per 24 hour  Intake 808.4 ml  Output 700 ml  Net 108.4 ml   Filed Weights   09/09/20 1351 09/10/20 0226  Weight: 97.5 kg 97.4 kg    Examination:  General exam: Appears comfortable  Respiratory system: clear breath sounds b/l. No wheezes, rales  Cardiovascular system: S1/S2+. No rubs or clicks  Gastrointestinal system: Abd is soft, NT, ND & normal bowel sounds  Central nervous system: Alert and oriented. Moves all extremities  Psychiatry: Judgement and insight appear normal. Flat mood and affect      Data Reviewed: I have personally reviewed following labs and imaging studies  CBC: Recent Labs  Lab 09/09/20 1354 09/10/20 0429 09/11/20 0349 09/12/20 0045  WBC 7.8 6.7 6.5 7.1  NEUTROABS 6.7  --   --   --   HGB 13.3 12.7* 13.2 12.8*  HCT 40.2 38.9* 40.5 39.3  MCV 74.0* 75.7* 74.9* 75.7*  PLT 184 172 178 0000000   Basic Metabolic Panel: Recent Labs  Lab 09/09/20 1354 09/11/20 0349 09/12/20 0045  NA 138 139 139  K 3.6 3.6 3.2*  CL 103 104 104  CO2 26 27 26   GLUCOSE 128* 109* 153*  BUN 12 18 17   CREATININE 0.92 0.90 1.01  CALCIUM 9.2 8.9 8.9  MG  --   --  1.9   GFR: Estimated Creatinine Clearance: 85.8 mL/min (by C-G formula based on SCr of 1.01 mg/dL). Liver Function Tests: Recent Labs  Lab 09/09/20 1354  AST 21  ALT 21  ALKPHOS 63  BILITOT 0.9  PROT 7.5  ALBUMIN 4.0   No results for input(s): LIPASE, AMYLASE in the last 168 hours. No results for input(s): AMMONIA in the last 168 hours. Coagulation Profile: Recent Labs  Lab 09/09/20 1354  INR 0.9   Cardiac Enzymes: No results for input(s): CKTOTAL, CKMB, CKMBINDEX, TROPONINI in the last 168 hours. BNP (last 3 results) No results for input(s): PROBNP in the last 8760 hours. HbA1C: Recent Labs    09/10/20 0429  HGBA1C 5.6   CBG: Recent Labs  Lab 09/10/20 0251  GLUCAP 109*   Lipid  Profile: Recent Labs    09/10/20 0429  CHOL 256*  HDL 78  LDLCALC 154*  TRIG 120  CHOLHDL 3.3   Thyroid Function Tests: No results for input(s): TSH, T4TOTAL, FREET4, T3FREE, THYROIDAB in the last 72 hours. Anemia Panel: No results for input(s): VITAMINB12, FOLATE, FERRITIN, TIBC, IRON, RETICCTPCT in the last 72 hours. Sepsis Labs: No results for input(s): PROCALCITON, LATICACIDVEN in the last 168 hours.  Recent Results (from the past 240 hour(s))  Resp Panel by RT-PCR (Flu A&B, Covid) Nasopharyngeal Swab     Status: None   Collection Time: 09/09/20  5:32 PM   Specimen: Nasopharyngeal Swab; Nasopharyngeal(NP) swabs in vial transport medium  Result Value Ref Range Status   SARS Coronavirus 2 by RT PCR NEGATIVE NEGATIVE Final    Comment: (NOTE) SARS-CoV-2 target nucleic acids are NOT DETECTED.  The SARS-CoV-2 RNA is generally detectable in upper respiratory specimens during the acute phase of infection. The lowest concentration of SARS-CoV-2 viral copies this assay can detect is 138 copies/mL. A negative result does not preclude SARS-Cov-2 infection and should not be used as the sole basis for treatment or other patient management decisions. A negative result may occur with  improper specimen collection/handling, submission of specimen other than nasopharyngeal swab, presence of viral mutation(s) within the areas targeted by this assay, and inadequate number of viral copies(<138 copies/mL). A negative result must be combined with clinical observations, patient history, and epidemiological information. The expected result is Negative.  Fact Sheet for Patients:  EntrepreneurPulse.com.au  Fact Sheet for Healthcare Providers:  IncredibleEmployment.be  This test is no t yet approved or cleared by the [place] FDA and  has been authorized for detection and/or diagnosis of SARS-CoV-2 by FDA under an Emergency Use Authorization (EUA). This  EUA will remain  in effect (meaning this test can be used) for the duration of the COVID-19 declaration under Section 564(b)(1) of the Act, 21 U.S.C.section 360bbb-3(b)(1), unless the authorization is terminated  or revoked sooner.       Influenza A by PCR NEGATIVE NEGATIVE Final   Influenza B by PCR NEGATIVE NEGATIVE Final    Comment: (NOTE) The Xpert Xpress SARS-CoV-2/FLU/RSV plus assay is intended as an aid in the diagnosis of influenza from Nasopharyngeal swab specimens and should not be used as a sole basis for treatment. Nasal washings and aspirates are unacceptable for Xpert Xpress SARS-CoV-2/FLU/RSV testing.  Fact Sheet for Patients: EntrepreneurPulse.com.au  Fact Sheet for Healthcare Providers: IncredibleEmployment.be  This test is not yet approved or cleared by the [place] and has been authorized  for detection and/or diagnosis of SARS-CoV-2 by FDA under an Emergency Use Authorization (EUA). This EUA will remain in effect (meaning this test can be used) for the duration of the COVID-19 declaration under Section 564(b)(1) of the Act, 21 U.S.C. section 360bbb-3(b)(1), unless the authorization is terminated or revoked.  Performed at [facility], [address] [place], [place] [address]   MRSA PCR Screening     Status: None   Collection Time: 09/10/20  2:43 AM   Specimen: Nasopharyngeal  Result Value Ref Range Status   MRSA by PCR NEGATIVE NEGATIVE Final    Comment:        The GeneXpert MRSA Assay (FDA approved for NASAL specimens only), is one component of a comprehensive MRSA colonization surveillance program. It is not intended to diagnose MRSA infection nor to guide or monitor treatment for MRSA infections. Performed at [facility], [address] [place], [place] [address]          Radiology Studies: ECHOCARDIOGRAM COMPLETE  Result Date: 09/10/2020    ECHOCARDIOGRAM REPORT    Patient Name:   CASANOVA SCHURMAN Date of Exam: 09/10/2020 Medical Rec #:  [id number]           Height:       72.0 in Accession #:    [id number]          Weight:       214.7 lb Date of Birth:  Nov 09, 1950          BSA:          2.196 m Patient Age:    29 years            BP:           142/73 mmHg Patient Gender: M                   HR:           67 bpm. Exam Location:  [facility] Procedure: 2D Echo, Color Doppler, Cardiac Doppler and Strain Analysis Indications:     I63.9 Stroke  History:         Patient has no prior history of Echocardiogram examinations.                  Signs/Symptoms:Headache. Asthma.  Sonographer:     Charmayne Sheer RDCS (AE) Referring Phys:  [id number] Collier Bullock Diagnosing Phys: Bartholome Bill MD  Sonographer Comments: Suboptimal parasternal window. Global longitudinal strain was attempted. IMPRESSIONS  1. Left ventricular ejection fraction, by estimation, is 60 to 65%. The left ventricle has normal function. The left ventricle has no regional wall motion abnormalities. Left ventricular diastolic parameters were normal.  2. Right ventricular systolic function is normal. The right ventricular size is normal.  3. The mitral valve is grossly normal. No evidence of mitral valve regurgitation.  4. The aortic valve is grossly normal. Aortic valve regurgitation is not visualized. FINDINGS  Left Ventricle: Left ventricular ejection fraction, by estimation, is 60 to 65%. The left ventricle has normal function. The left ventricle has no regional wall motion abnormalities. The left ventricular internal cavity size was normal in size. There is  borderline left ventricular hypertrophy. Left ventricular diastolic parameters were normal. Right Ventricle: The right ventricular size is normal. No increase in right ventricular wall thickness. Right ventricular systolic function is normal. Left Atrium: Left atrial size was normal in size. Right Atrium: Right atrial size was normal in size. Pericardium: There is no  evidence of pericardial  effusion. Mitral Valve: The mitral valve is grossly normal. No evidence of mitral valve regurgitation. MV peak gradient, 3.0 mmHg. The mean mitral valve gradient is 1.0 mmHg. Tricuspid Valve: The tricuspid valve is not well visualized. Tricuspid valve regurgitation is trivial. Aortic Valve: The aortic valve is grossly normal. Aortic valve regurgitation is not visualized. Aortic valve mean gradient measures 4.0 mmHg. Aortic valve peak gradient measures 6.9 mmHg. Aortic valve area, by VTI measures 2.49 cm. Pulmonic Valve: The pulmonic valve was not well visualized. Pulmonic valve regurgitation is trivial. Aorta: The aortic root is normal in size and structure. IAS/Shunts: The interatrial septum was not well visualized.  LEFT VENTRICLE PLAX 2D LVIDd:         4.80 cm  Diastology LVIDs:         3.10 cm  LV e' medial:    10.40 cm/s LV PW:         1.00 cm  LV E/e' medial:  7.9 LV IVS:        0.80 cm  LV e' lateral:   14.50 cm/s LVOT diam:     2.10 cm  LV E/e' lateral: 5.6 LV SV:         65 LV SV Index:   30 LVOT Area:     3.46 cm  RIGHT VENTRICLE RV Basal diam:  2.90 cm LEFT ATRIUM             Index       RIGHT ATRIUM           Index LA diam:        3.80 cm 1.73 cm/m  RA Area:     14.50 cm LA Vol (A2C):   34.8 ml 15.85 ml/m RA Volume:   36.70 ml  16.71 ml/m LA Vol (A4C):   41.2 ml 18.76 ml/m LA Biplane Vol: 39.0 ml 17.76 ml/m  AORTIC VALVE                   PULMONIC VALVE AV Area (Vmax):    2.64 cm    PV Vmax:       1.03 m/s AV Area (Vmean):   2.51 cm    PV Vmean:      67.500 cm/s AV Area (VTI):     2.49 cm    PV VTI:        0.167 m AV Vmax:           131.00 cm/s PV Peak grad:  4.2 mmHg AV Vmean:          90.500 cm/s PV Mean grad:  2.0 mmHg AV VTI:            0.262 m AV Peak Grad:      6.9 mmHg AV Mean Grad:      4.0 mmHg LVOT Vmax:         99.90 cm/s LVOT Vmean:        65.700 cm/s LVOT VTI:          0.188 m LVOT/AV VTI ratio: 0.72  AORTA Ao Root diam: 3.50 cm MITRAL VALVE MV Area (PHT):  3.81 cm    SHUNTS MV Area VTI:   2.66 cm    Systemic VTI:  0.19 m MV Peak grad:  3.0 mmHg    Systemic Diam: 2.10 cm MV Mean grad:  1.0 mmHg MV Vmax:       0.87 m/s MV Vmean:      54.8 cm/s MV Decel Time: 199 msec MV  E velocity: 81.80 cm/s MV A velocity: 79.70 cm/s MV E/A ratio:  1.03 Bartholome Bill MD Electronically signed by Bartholome Bill MD Signature Date/Time: 09/10/2020/4:56:24 PM    Final         Scheduled Meds: . aspirin  81 mg Oral Daily  . atorvastatin  80 mg Oral Daily  . Chlorhexidine Gluconate Cloth  6 each Topical Q0600  . docusate sodium  200 mg Oral BID  . fluorescein  1 strip Both Eyes Once  . montelukast  10 mg Oral QHS  . pantoprazole  40 mg Oral Daily  . polyethylene glycol  17 g Oral Daily  . sodium chloride flush  3 mL Intravenous Once  . tetracaine  2 drop Both Eyes Once  . triamcinolone  1 spray Nasal Daily   Continuous Infusions: . heparin 1,000 Units/hr (09/12/20 0700)     LOS: 3 days    Time spent: 30 mins     Wyvonnia Dusky, MD [facility] Pager [phone]  If 7PM-7AM, please contact night-coverage 09/12/2020, 8:28 AM

## 2020-09-12 NOTE — Progress Notes (Signed)
Neurology Progress Note   S:// Seen and examined. Headache not completely resolved. Still present. Reports sinus pressure type headache.  O:// Current vital signs: BP 129/69   Pulse 87   Temp 98.6 F (37 C) (Oral)   Resp (!) 24   Ht 6' (1.829 m)   Wt 97.4 kg   SpO2 99%   BMI 29.12 kg/m  Vital signs in last 24 hours: Temp:  [98.5 F (36.9 C)-99 F (37.2 C)] 98.6 F (37 C) (04/22 0200) Pulse Rate:  [56-87] 87 (04/22 0716) Resp:  [14-24] 24 (04/22 0716) BP: (108-160)/(54-92) 129/69 (04/22 0716) SpO2:  [94 %-99 %] 99 % (04/22 0716) FiO2 (%):  [21 %] 21 % (04/22 0500) GENERAL: Awake, alert in NAD HEENT: - Normocephalic and atraumatic, dry mm, no LN++, no Thyromegally, continues to have palpable tenderness over the right frontal and maxillary sinuses. LUNGS - Clear to auscultation bilaterally with no wheezes CV - S1S2 RRR, no m/r/g, equal pulses bilaterally. ABDOMEN - Soft, nontender, nondistended with normoactive BS Ext: warm, well perfused, no edema NEURO:  Mental Status: Awake alert oriented x3 Speech and language: No evidence of dysarthria or aphasia Cranial nerves: Right pupil is 2 mm sluggishly reactive, left pupil is 3 mm normally reactive, no visual field cuts, no facial asymmetry, facial sensation intact, tongue and palate midline. Motor exam: 5/5 in all fours without vertical drift with normal tone and normal range of motion. Sensation intact light touch without extinction Coordination: Intact finger-nose-finger testing bilaterally Gait testing deferred NIH stroke scale remains 0 Unchanged exam  Medications  Current Facility-Administered Medications:  .  acetaminophen (TYLENOL) tablet 650 mg, 650 mg, Oral, Q4H PRN, 650 mg at 09/11/20 1948 **OR** acetaminophen (TYLENOL) 160 MG/5ML solution 650 mg, 650 mg, Per Tube, Q4H PRN **OR** acetaminophen (TYLENOL) suppository 650 mg, 650 mg, Rectal, Q4H PRN, Agbata, Tochukwu, MD .  aspirin chewable tablet 81 mg, 81 mg, Oral,  Daily, Amie Portland, MD, 81 mg at 09/12/20 0846 .  atorvastatin (LIPITOR) tablet 80 mg, 80 mg, Oral, Daily, Amie Portland, MD, 80 mg at 09/12/20 0846 .  Chlorhexidine Gluconate Cloth 2 % PADS 6 each, 6 each, Topical, Q0600, Agbata, Tochukwu, MD, 6 each at 09/12/20 0532 .  docusate sodium (COLACE) capsule 200 mg, 200 mg, Oral, BID, Wyvonnia Dusky, MD, 200 mg at 09/12/20 0845 .  fluorescein ophthalmic strip 1 strip, 1 strip, Both Eyes, Once, Nance Pear, MD .  heparin ADULT infusion 100 units/mL (25000 units/265mL), 1,000 Units/hr, Intravenous, Continuous, Wyvonnia Dusky, MD, Last Rate: 10 mL/hr at 09/12/20 0700, 1,000 Units/hr at 09/12/20 0700 .  hydrALAZINE (APRESOLINE) injection 10 mg, 10 mg, Intravenous, Q6H PRN, Sharion Settler, NP .  montelukast (SINGULAIR) tablet 10 mg, 10 mg, Oral, QHS, Agbata, Tochukwu, MD, 10 mg at 09/11/20 2257 .  pantoprazole (PROTONIX) EC tablet 40 mg, 40 mg, Oral, Daily, Agbata, Tochukwu, MD, 40 mg at 09/12/20 0846 .  polyethylene glycol (MIRALAX / GLYCOLAX) packet 17 g, 17 g, Oral, Daily, Wyvonnia Dusky, MD, 17 g at 09/12/20 0846 .  prochlorperazine (COMPAZINE) tablet 5 mg, 5 mg, Oral, Q6H PRN, Amie Portland, MD, 5 mg at 09/12/20 0549 .  sodium chloride flush (NS) 0.9 % injection 3 mL, 3 mL, Intravenous, Once, Nance Pear, MD .  tetracaine (PONTOCAINE) 0.5 % ophthalmic solution 2 drop, 2 drop, Both Eyes, Once, Nance Pear, MD .  triamcinolone (NASACORT) nasal inhaler 1 spray, 1 spray, Nasal, Daily, Agbata, Tochukwu, MD, 1 spray at 09/12/20 0847 Labs CBC  Component Value Date/Time   WBC 7.1 09/12/2020 0045   RBC 5.19 09/12/2020 0045   HGB 12.8 (L) 09/12/2020 0045   HGB 12.9 (L) 12/28/2016 1446   HCT 39.3 09/12/2020 0045   HCT 39.3 12/28/2016 1446   PLT 175 09/12/2020 0045   PLT 171 12/28/2016 1446   MCV 75.7 (L) 09/12/2020 0045   MCV 74 (L) 12/28/2016 1446   MCH 24.7 (L) 09/12/2020 0045   MCHC 32.6 09/12/2020 0045   RDW 13.4  09/12/2020 0045   RDW 14.4 12/28/2016 1446   LYMPHSABS 0.7 09/09/2020 1354   LYMPHSABS 0.9 12/28/2016 1446   MONOABS 0.4 09/09/2020 1354   EOSABS 0.0 09/09/2020 1354   EOSABS 0.2 12/28/2016 1446   BASOSABS 0.0 09/09/2020 1354   BASOSABS 0.0 12/28/2016 1446    CMP     Component Value Date/Time   NA 139 09/12/2020 0045   K 3.2 (L) 09/12/2020 0045   CL 104 09/12/2020 0045   CO2 26 09/12/2020 0045   GLUCOSE 153 (H) 09/12/2020 0045   BUN 17 09/12/2020 0045   CREATININE 1.01 09/12/2020 0045   CALCIUM 8.9 09/12/2020 0045   PROT 7.5 09/09/2020 1354   ALBUMIN 4.0 09/09/2020 1354   AST 21 09/09/2020 1354   ALT 21 09/09/2020 1354   ALKPHOS 63 09/09/2020 1354   BILITOT 0.9 09/09/2020 1354   GFRNONAA >60 09/12/2020 0045   GFRAA >60 06/25/2018 1111    Lipid Panel     Component Value Date/Time   CHOL 256 (H) 09/10/2020 0429   TRIG 120 09/10/2020 0429   HDL 78 09/10/2020 0429   CHOLHDL 3.3 09/10/2020 0429   VLDL 24 09/10/2020 0429   LDLCALC 154 (H) 09/10/2020 0429   A1c 5.6 LDL 154 2D LVEF 60-65%. No RWMA, LA size normal. IAS not well visualized.  Imaging I have reviewed images in epic and the results pertinent to this consultation are: CT head reviewed: Right parietal hypodensity, no bleed. MRI of the brain with and without contrast reviewed: Confirms the right parietal hypodensity seen on CT scan to be consistent with an acute ischemic stroke.  Scattered mucosal thickening of the ethmoidal air cells and frontal sinuses.  Poorly visualized right internal carotid flow-void at the skull base on T2 images. CTA head and neck reviewed: Concerning for intraluminal thrombus in the right internal carotid artery at the skull base extending into the petrous internal carotid artery with high-grade/near occlusive stenosis with more distal opacification of the ICA intracranially- could reflect a dissection as there is lack of atherosclerosis elsewhere.  Otherwise no proximal stenosis in the  head and neck.  Assessment:  70 year old man with past medical history of prostate cancer, asthma and arthritis presented to the emergency room for right-sided headache of 2 days duration, centered around the right forehead and right maxilla with pressure-like feeling with minimal relief with over-the-counter NSAIDs, noted to have an abnormal CT head finding of a right parietal hypodensity which was later verified to be an acute ischemic stroke in the right parietal lobe. 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  He was started on heparin drip yesterday. Continues to do well.  On examination, NIH stroke scale remains 0.  Headache is better but not completely resolved.  Impression:  Acute ischemic stroke-likely secondary to right ICA thrombus versus dissection  Headache - sinus headache vs. Hemicrania continua vs headache secondary to dissection  Recommendations:  Frequent neurochecks and telemetry  Can discontinue heparin drip  Load with plavix 300 mg today, give ASA 81 today - from tomorrow - ASA 81 + Plavix 75 daily till he sees outpatient neurology.  High-dose statin for goal LDL of less than 70.  His current LDL is 154.  A1c and 2D echo unremarkable  In 2 to 3 months: Repeat CTA head and outpatient neurology follow-up  PT OT  Symptomatic management of the headache with Tylenol.  Compazine also not extremely effective.  Will do one dose of  IV Solumedrol 500 mg x1 if still has headache, will do tramadol PRN  Keep for another night - if stays stable, can d/c home tomorrow. Can be de-escalated to tele bed if desired.  Plan discussed with Dr. Jimmye Norman on the unit.  -- Amie Portland, MD Neurologist [facility] Pager: [phone]

## 2020-09-12 NOTE — Consult Note (Signed)
[place] for heparin infusion Indication: stroke  Allergies  Allergen Reactions  . Aspirin Diarrhea and Nausea And Vomiting    Patient Measurements: Height: 6' (182.9 cm) Weight: 97.4 kg (214 lb 11.7 oz) IBW/kg (Calculated) : 77.6 Heparin Dosing Weight: 97.5 kg   Labs: Recent Labs    09/09/20 1354 09/10/20 0215 09/10/20 0429 09/10/20 0830 09/11/20 0349 09/11/20 1104 09/11/20 1733 09/12/20 0045  HGB 13.3  --  12.7*  --  13.2  --   --  12.8*  HCT 40.2  --  38.9*  --  40.5  --   --  39.3  PLT 184  --  172  --  178  --   --  175  APTT 25  --   --   --   --   --   --   --   LABPROT 12.6  --   --   --   --   --   --   --   INR 0.9  --   --   --   --   --   --   --   HEPARINUNFRC  --    < >  --    < > 0.79* 0.42 0.27* 0.30  CREATININE 0.92  --   --   --  0.90  --   --  1.01   < > = values in this interval not displayed.    Estimated Creatinine Clearance: 85.8 mL/min (by C-G formula based on SCr of 1.01 mg/dL).   Medical History: Past Medical History:  Diagnosis Date  . Arthritis   . Asthma   . Epistaxis   . GERD (gastroesophageal reflux disease)   . Kidney stones   . Prostate cancer ([place])   . Sinusitis     Medications:  No prior anticoagulation noted  Assessment: 69 presented to the emergency room for evaluation of right-sided headache. On Findings of CTA of head/neck concerning for acute CVA, concerning for intraluminal thombus/dissection. Pharmacy has been consulted for initiation and management of heparin infusion via stroke protocol. Baseline CBC, aPTT, PT, and INR WNL.    Goal of Therapy:  Heparin level 0.3-0.5 units/ml Monitor platelets by anticoagulation protocol: Yes   0420 0215 HL 0.38, therapeutic 0420 0830 HL 0.52, slightly supratherapeutic  0420 1448 HL 0.49, therapeutic @ 1300 units/hr 0420 2105 HL 0.70, supratherapeutic  0421 0349 HL 0.79, supratherapeutic 0421 1104 HL 0.42, therapeutic x 1 0421 1733  HL 0.27, subtherapeutic 0422 0045 HL 0.30, therapeutic   Plan:   Continue heparin rate at 1000 units/hr  Recheck HL at 0700 on 4/22 to confirm  Targeting lower end of heparin therapeutic range: 0.3-0.5   CBC daily  Per neurology: plan to continue heparin infusion x 24 hrs  Renda Rolls, PharmD, [facility] 09/12/2020 1:29 AM

## 2020-09-12 NOTE — Consult Note (Addendum)
[facility] for heparin infusion Indication: stroke  Allergies  Allergen Reactions  . Aspirin Diarrhea and Nausea And Vomiting    Patient Measurements: Height: 6' (182.9 cm) Weight: 97.4 kg (214 lb 11.7 oz) IBW/kg (Calculated) : 77.6 Heparin Dosing Weight: 97.5 kg   Labs: Recent Labs    09/09/20 1354 09/10/20 0215 09/10/20 0429 09/10/20 0830 09/11/20 0349 09/11/20 1104 09/11/20 1733 09/12/20 0045 09/12/20 0645  HGB 13.3  --  12.7*  --  13.2  --   --  12.8*  --   HCT 40.2  --  38.9*  --  40.5  --   --  39.3  --   PLT 184  --  172  --  178  --   --  175  --   APTT 25  --   --   --   --   --   --   --   --   LABPROT 12.6  --   --   --   --   --   --   --   --   INR 0.9  --   --   --   --   --   --   --   --   HEPARINUNFRC  --    < >  --    < > 0.79*   < > 0.27* 0.30 0.37  CREATININE 0.92  --   --   --  0.90  --   --  1.01  --    < > = values in this interval not displayed.    Estimated Creatinine Clearance: 85.8 mL/min (by C-G formula based on SCr of 1.01 mg/dL).   Medical History: Past Medical History:  Diagnosis Date  . Arthritis   . Asthma   . Epistaxis   . GERD (gastroesophageal reflux disease)   . Kidney stones   . Prostate cancer ([place])   . Sinusitis     Medications:  No prior anticoagulation noted  Assessment: 69 presented to the emergency room for evaluation of right-sided headache. On Findings of CTA of head/neck concerning for acute CVA, concerning for intraluminal thombus/dissection. Pharmacy has been consulted for initiation and management of heparin infusion via stroke protocol. Baseline CBC, aPTT, PT, and INR WNL.    Goal of Therapy:  Heparin level 0.3-0.5 units/ml Monitor platelets by anticoagulation protocol: Yes   0420 0215 HL 0.38, therapeutic 0420 0830 HL 0.52, slightly supratherapeutic  0420 1448 HL 0.49, therapeutic @ 1300 units/hr 0420 2105 HL 0.70, supratherapeutic  0421 0349 HL 0.79,  supratherapeutic 0421 1104 HL 0.42, therapeutic x 1 0421 1733 HL 0.27, subtherapeutic 0422 0045 HL 0.30, therapeutic 0422 0645 HL 0.37, therapeutic x2   Plan:   Per neurology: plan to continue heparin infusion x 24 hrs (4/21 note)  HL therapeutic x2, continue heparin rate at 1000 units/hr and f/u with neuro for plan to switch to DAPT   Recheck HL with AM labs   Targeting lower end of heparin therapeutic range: 0.3-0.5   CBC daily while on heparin   Sherilyn Banker, PharmD Pharmacy Resident  09/12/2020 9:06 AM

## 2020-09-13 LAB — CBC
HCT: 39.3 % (ref 39.0–52.0)
Hemoglobin: 13.3 g/dL (ref 13.0–17.0)
MCH: 24.9 pg — ABNORMAL LOW (ref 26.0–34.0)
MCHC: 33.8 g/dL (ref 30.0–36.0)
MCV: 73.6 fL — ABNORMAL LOW (ref 80.0–100.0)
Platelets: 196 10*3/uL (ref 150–400)
RBC: 5.34 MIL/uL (ref 4.22–5.81)
RDW: 13.4 % (ref 11.5–15.5)
WBC: 10.9 10*3/uL — ABNORMAL HIGH (ref 4.0–10.5)
nRBC: 0 % (ref 0.0–0.2)

## 2020-09-13 LAB — BASIC METABOLIC PANEL
Anion gap: 12 (ref 5–15)
BUN: 24 mg/dL — ABNORMAL HIGH (ref 8–23)
CO2: 26 mmol/L (ref 22–32)
Calcium: 9.6 mg/dL (ref 8.9–10.3)
Chloride: 101 mmol/L (ref 98–111)
Creatinine, Ser: 0.92 mg/dL (ref 0.61–1.24)
GFR, Estimated: >60 mL/min (ref >60)
Glucose, Bld: 122 mg/dL — ABNORMAL HIGH (ref 70–99)
Potassium: 4 mmol/L (ref 3.5–5.1)
Sodium: 139 mmol/L (ref 135–145)

## 2020-09-13 MED ORDER — TRAMADOL HCL 50 MG PO TABS: 50.0000 mg | ORAL_TABLET | Freq: Four times a day (QID) | ORAL | 0 refills | Status: AC | PRN

## 2020-09-13 MED ORDER — ASPIRIN 81 MG PO CHEW: 81.0000 mg | CHEWABLE_TABLET | Freq: Every day | ORAL | 0 refills | Status: AC

## 2020-09-13 MED ORDER — CLOPIDOGREL BISULFATE 75 MG PO TABS: 75.0000 mg | ORAL_TABLET | Freq: Every day | ORAL | 0 refills | Status: AC

## 2020-09-13 MED ORDER — ATORVASTATIN CALCIUM 80 MG PO TABS: 80.0000 mg | ORAL_TABLET | Freq: Every day | ORAL | 0 refills | Status: DC

## 2020-09-13 NOTE — Progress Notes (Signed)
Neurology Progress Note   S:// Seen and examined. Headache not completely resolved-but come down a lot.  O:// Current vital signs: BP (!) 138/94 (BP Location: Right Arm)   Pulse 76   Temp 98.2 F (36.8 C) (Oral)   Resp 18   Ht 6' (1.829 m)   Wt 93.4 kg   SpO2 100%   BMI 27.93 kg/m  Vital signs in last 24 hours: Temp:  [98.2 F (36.8 C)-99.3 F (37.4 C)] 98.2 F (36.8 C) (04/23 0753) Pulse Rate:  [76-101] 76 (04/23 0753) Resp:  [18-20] 18 (04/23 0753) BP: (129-144)/(71-94) 138/94 (04/23 0753) SpO2:  [98 %-100 %] 100 % (04/23 0753) Weight:  [92.8 kg-93.4 kg] 93.4 kg (04/23 0437) GENERAL: Awake, alert in NAD HEENT: - Normocephalic and atraumatic, dry mm, no LN++, no Thyromegally, continues to have palpable tenderness over the right frontal and maxillary sinuses. LUNGS - Clear to auscultation bilaterally with no wheezes CV - S1S2 RRR, no m/r/g, equal pulses bilaterally. ABDOMEN - Soft, nontender, nondistended with normoactive BS Ext: warm, well perfused, no edema NEURO:  Mental Status: Awake alert oriented x3 Speech and language: No evidence of dysarthria or aphasia Cranial nerves: Right pupil is 2 mm sluggishly reactive, left pupil is 3 mm normally reactive, no visual field cuts, no facial asymmetry, facial sensation intact, tongue and palate midline. Motor exam: 5/5 in all fours without vertical drift with normal tone and normal range of motion. Sensation intact light touch without extinction Coordination: Intact finger-nose-finger testing bilaterally Gait testing deferred NIH stroke scale remains 0 Unchanged exam from past 2-3 days Walked with PT without issues per patient  Medications  Current Facility-Administered Medications:  .  acetaminophen (TYLENOL) tablet 650 mg, 650 mg, Oral, Q4H PRN, 650 mg at 09/11/20 1948 **OR** acetaminophen (TYLENOL) 160 MG/5ML solution 650 mg, 650 mg, Per Tube, Q4H PRN **OR** acetaminophen (TYLENOL) suppository 650 mg, 650 mg, Rectal,  Q4H PRN, Agbata, Tochukwu, MD .  aspirin chewable tablet 81 mg, 81 mg, Oral, Daily, Amie Portland, MD, 81 mg at 09/12/20 0846 .  atorvastatin (LIPITOR) tablet 80 mg, 80 mg, Oral, Daily, Amie Portland, MD, 80 mg at 09/12/20 0846 .  clopidogrel (PLAVIX) tablet 75 mg, 75 mg, Oral, Daily, Amie Portland, MD .  docusate sodium (COLACE) capsule 200 mg, 200 mg, Oral, BID, Wyvonnia Dusky, MD, 200 mg at 09/12/20 2140 .  fluorescein ophthalmic strip 1 strip, 1 strip, Both Eyes, Once, Nance Pear, MD .  hydrALAZINE (APRESOLINE) injection 10 mg, 10 mg, Intravenous, Q6H PRN, Sharion Settler, NP .  montelukast (SINGULAIR) tablet 10 mg, 10 mg, Oral, QHS, Agbata, Tochukwu, MD, 10 mg at 09/12/20 2140 .  pantoprazole (PROTONIX) EC tablet 40 mg, 40 mg, Oral, Daily, Agbata, Tochukwu, MD, 40 mg at 09/12/20 0846 .  polyethylene glycol (MIRALAX / GLYCOLAX) packet 17 g, 17 g, Oral, Daily, Wyvonnia Dusky, MD, 17 g at 09/12/20 0846 .  prochlorperazine (COMPAZINE) tablet 5 mg, 5 mg, Oral, Q6H PRN, Amie Portland, MD, 5 mg at 09/12/20 0549 .  sodium chloride flush (NS) 0.9 % injection 3 mL, 3 mL, Intravenous, Once, Nance Pear, MD .  tetracaine (PONTOCAINE) 0.5 % ophthalmic solution 2 drop, 2 drop, Both Eyes, Once, Nance Pear, MD .  traMADol Veatrice Bourbon) tablet 50 mg, 50 mg, Oral, Q6H PRN, Amie Portland, MD .  triamcinolone (NASACORT) nasal inhaler 1 spray, 1 spray, Nasal, Daily, Agbata, Tochukwu, MD, 1 spray at 09/12/20 0847 Labs CBC    Component Value Date/Time   WBC 10.9 (  H) 09/13/2020 0522   RBC 5.34 09/13/2020 0522   HGB 13.3 09/13/2020 0522   HGB 12.9 (L) 12/28/2016 1446   HCT 39.3 09/13/2020 0522   HCT 39.3 12/28/2016 1446   PLT 196 09/13/2020 0522   PLT 171 12/28/2016 1446   MCV 73.6 (L) 09/13/2020 0522   MCV 74 (L) 12/28/2016 1446   MCH 24.9 (L) 09/13/2020 0522   MCHC 33.8 09/13/2020 0522   RDW 13.4 09/13/2020 0522   RDW 14.4 12/28/2016 1446   LYMPHSABS 0.7 09/09/2020 1354    LYMPHSABS 0.9 12/28/2016 1446   MONOABS 0.4 09/09/2020 1354   EOSABS 0.0 09/09/2020 1354   EOSABS 0.2 12/28/2016 1446   BASOSABS 0.0 09/09/2020 1354   BASOSABS 0.0 12/28/2016 1446    CMP     Component Value Date/Time   NA 139 09/13/2020 0522   K 4.0 09/13/2020 0522   CL 101 09/13/2020 0522   CO2 26 09/13/2020 0522   GLUCOSE 122 (H) 09/13/2020 0522   BUN 24 (H) 09/13/2020 0522   CREATININE 0.92 09/13/2020 0522   CALCIUM 9.6 09/13/2020 0522   PROT 7.5 09/09/2020 1354   ALBUMIN 4.0 09/09/2020 1354   AST 21 09/09/2020 1354   ALT 21 09/09/2020 1354   ALKPHOS 63 09/09/2020 1354   BILITOT 0.9 09/09/2020 1354   GFRNONAA >60 09/13/2020 0522   GFRAA >60 06/25/2018 1111    Lipid Panel     Component Value Date/Time   CHOL 256 (H) 09/10/2020 0429   TRIG 120 09/10/2020 0429   HDL 78 09/10/2020 0429   CHOLHDL 3.3 09/10/2020 0429   VLDL 24 09/10/2020 0429   LDLCALC 154 (H) 09/10/2020 0429   A1c 5.6 LDL 154 2D LVEF 60-65%. No RWMA, LA size normal. IAS not well visualized.  Imaging I have reviewed images in epic and the results pertinent to this consultation are: CT head reviewed: Right parietal hypodensity, no bleed. MRI of the brain with and without contrast reviewed: Confirms the right parietal hypodensity seen on CT scan to be consistent with an acute ischemic stroke.  Scattered mucosal thickening of the ethmoidal air cells and frontal sinuses.  Poorly visualized right internal carotid flow-void at the skull base on T2 images. CTA head and neck reviewed: Concerning for intraluminal thrombus in the right internal carotid artery at the skull base extending into the petrous internal carotid artery with high-grade/near occlusive stenosis with more distal opacification of the ICA intracranially- could reflect a dissection as there is lack of atherosclerosis elsewhere.  Otherwise no proximal stenosis in the head and neck.  Assessment:  70 year old man with past medical history of  prostate cancer, asthma and arthritis presented to the emergency room for right-sided headache of 2 days duration, centered around the right forehead and right maxilla with pressure-like feeling with minimal relief with over-the-counter NSAIDs, noted to have an abnormal CT head finding of a right parietal hypodensity which was later verified to be an acute ischemic stroke in the right parietal lobe. 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 Was on heparin drip for 3 days, transitioned to DAPT which he will need for at least 3 months. Then a CTA head/neck needs to be done to de-escalate DAPT to ASA only.  Impression:  Acute ischemic stroke-likely secondary to right ICA thrombus versus dissection  Headache - sinus headache v secondary to dissection  Recommendations:  Continue ASA 81 and  Plavix 75 till you see outpatient neurology - a repeat CTA head and neck will be done to evaluate the right carotid dissection/thrombus  High-dose statin for goal LDL of less than 70.  His current LDL is 154.  A1c and 2D echo unremarkable  PT OT  Tramadol PRN for headache  OK to be discharged from neurology perspective with f/u at stroke clinic Dr. Arvin Collard [facility] or stroke clinic of his choice.  Plan discussed with Dr. Jimmye Norman  -- Amie Portland, MD Neurologist [facility] Pager: [phone]

## 2020-09-13 NOTE — Discharge Summary (Signed)
Physician Discharge Summary  William Hicks [id number] DOB: 1951-04-19 DOA: 09/09/2020  PCP: Maude Leriche, PA-C  Admit date: 09/09/2020 Discharge date: 09/13/2020  Admitted From: home  Disposition: home  Recommendations for Outpatient Follow-up:  1. Follow up with PCP w/in 3-4 weeks 2. F/u w/ neuro, Dr. Leonie Man, in 3 months 3. Will need CTA head/neck as well in 3 months   Home Health: no  Equipment/Devices:  Discharge Condition: stable  CODE STATUS: full  Diet recommendation: Heart Healthy    Brief/Interim Summary: HPI was taken from Dr. Francine Graven: DEMICHAEL DEGUZMAN is a 70 y.o. male with medical history significant for prostate cancer, GERD, sinusitis who presents to the ER for evaluation of headache.  Patient states that he was in his usual state of health until yesterday evening while doing some yard work he developed sudden onset severe right-sided headache Which he rated a 10 x 10 in intensity at its worst and was associated with blurred vision.  Patient states he was unable to see out of his left eye. Headache is associated with nausea but no vomiting. He has never had headaches like this in the past and denies having any trauma.. He denies having any fever, no chills, no chest pain, no shortness of breath, no abdominal pain, no urinary symptoms, no changes in his bowel habits, no focal deficits, no cough, no difficulty swallowing Labs show sodium 138, potassium 3.6, chloride 103, bicarb 26, glucose 128, BUN 12, creatinine 0.92, calcium 9.2, alkaline phosphatase 63, albumin 4.0, AST 21, ALT 21, total protein 0.9, white count 7.8, hemoglobin 13.3, hematocrit 40.2, MCV 74, RDW 13.4, platelet count 184, PT 12.6, INR 0.9, CT scan of the head without contrast shows cortically based hypodensity in the high right parietal lobe. This has the appearance of acute or subacute infarct. No acute hemorrhage. Twelve-lead EKG reviewed by me shows normal sinus rhythm with nonspecific ST  changes.     ED Course: Patient is a 70 year old African-American male who presents to the ER for evaluation of sudden onset right-sided headache associated with vision changes in his left eye.  Patient states he was unable to see out of his left eye and could not see partially from his right eye.  He states that his vision is better but the headache has persisted. Headache is mostly over the right temporal and frontal area extending down the right side of his face.  Patient has conjunctival injection and increased lacrimation. CT scan of the head without contrast showed hypodensity in the high right parietal lobe which has the appearance of acute or subacute infarct.  Patient will be referred to observation status for further evaluation.  Hospital course from Dr. Jimmye Norman 4/20-4/23/22: Pt was found to have CVA.  CT scan of the head without contrast showscortically based hypodensity in the high right parietal lobe.This has the appearance of acute or subacute infarct. No acute hemorrhage. CTA neck shows concern for intraluminal thrombus in right internal carotid at the skull base extending into the petrous internal carotid arterty w/ high grade (near occlusive) stenosis. Pt was initially put on aspirin & IV heparin drip for a couple of days and then pt was transition to plavix and continued aspirin. Pt's statin was changed to a high intensity statin as well. Pt will f/u outpatient w/ neuro, Dr. Leonie Man, in 3 months as well as have another CTA head/neck in 3 months as per neuro. For more information, please see previous progress/consult notes.   Discharge Diagnoses:  Principal Problem:  CVA (cerebral vascular accident) [facility]) Active Problems:   GERD (gastroesophageal reflux disease)   Malignant neoplasm of prostate ([place])   Cluster headache   Acute CVA (cerebrovascular accident) ([place])   CVA: CT scan of the head without contrast showscortically based hypodensity in the high right parietal  lobe.This has the appearance of acute or subacute infarct. No acute hemorrhage. CTA neck shows concern for intraluminal thrombus in right internal carotid at the skull base extending into the petrous internal carotid arterty w/ high grade (near occlusive) stenosis. D/c IV heparin drip and start plavix and continue on aspirin as per neuro. Needs a repeat CTA head in 2-3 months outpatient as per neuro. Neuro recs apprec    Headache: etiology unclear, tension vs cluster. Tramadol prn   Hypokalemia: WNL today     Discharge Instructions  Discharge Instructions    Diet - low sodium heart healthy   Complete by: As directed    Discharge instructions   Complete by: As directed    F/u w/ neurology, Dr. Leonie Man, in 3 months. Will need another CTA head/neck in 3 months as well. You will need to go to your PCP w/in 1 month to get refills on aspirin, plavix & atorvastatin as the script that was sent to your pharmacy today was only a 30 day supply. Do not take any NSAIDs (examples: ibuprofen, advil, naproxen, naprosyn, aleve)   Increase activity slowly   Complete by: As directed      Allergies as of 09/13/2020      Reactions   Aspirin Diarrhea, Nausea And Vomiting      Medication List    STOP taking these medications   ibuprofen 800 MG tablet Commonly known as: ADVIL   omeprazole 40 MG capsule Commonly known as: PRILOSEC     TAKE these medications   albuterol 108 (90 Base) MCG/ACT inhaler Commonly known as: VENTOLIN HFA Inhale into the lungs.   aspirin 81 MG chewable tablet Chew 1 tablet (81 mg total) by mouth daily.   atorvastatin 80 MG tablet Commonly known as: LIPITOR Take 1 tablet (80 mg total) by mouth daily. What changed:   medication strength  See the new instructions.   Baclofen 5 MG Tabs Take 1 tablet by mouth 3 (three) times daily.   Cholecalciferol 25 MCG (1000 UT) tablet Take 1 tablet by mouth daily.   clopidogrel 75 MG tablet Commonly known as: PLAVIX Take 1  tablet (75 mg total) by mouth daily.   cromolyn 4 % ophthalmic solution Commonly known as: OPTICROM 1 drop 4 (four) times daily.   fluticasone 50 MCG/ACT nasal spray Commonly known as: FLONASE Place into the nose.   montelukast 10 MG tablet Commonly known as: SINGULAIR Take 1 tablet (10 mg total) by mouth at bedtime.   pantoprazole 40 MG tablet Commonly known as: PROTONIX TAKE ONE TABLET BY MOUTH EVERY DAY TO CONTROL STOMACH ACID   pseudoephedrine 30 MG tablet Commonly known as: SUDAFED 2 tablets as needed   traMADol 50 MG tablet Commonly known as: ULTRAM Take 1 tablet (50 mg total) by mouth every 6 (six) hours as needed for up to 3 days for moderate pain. What changed: See the new instructions.   triamcinolone 55 MCG/ACT Aero nasal inhaler Commonly known as: NASACORT Place 1 spray into the nose daily.       Follow-up Information    Garvin Fila, MD Follow up in 3 month(s).   Specialties: Neurology, Radiology Contact information: [place]  [place] [address] [phone]        Scifres, Earlie Server, PA-C Follow up in 3 week(s).   Specialty: Physician Assistant Contact information: [place] [address] [phone]              Allergies  Allergen Reactions  . Aspirin Diarrhea and Nausea And Vomiting    Consultations:  Neuro    Procedures/Studies: CT ANGIO HEAD W OR WO CONTRAST  Result Date: 09/09/2020 CLINICAL DATA:  Neuro deficit, acute stroke suspected. EXAM: CT ANGIOGRAPHY HEAD AND NECK TECHNIQUE: Multidetector CT imaging of the head and neck was performed using the standard protocol during bolus administration of intravenous contrast. Multiplanar CT image reconstructions and MIPs were obtained to evaluate the vascular anatomy. Carotid stenosis measurements (when applicable) are obtained utilizing NASCET criteria, using the distal internal carotid diameter as the denominator. CONTRAST:  49mL  OMNIPAQUE IOHEXOL 350 MG/ML SOLN COMPARISON:  Same day CT head. FINDINGS: CTA NECK FINDINGS Aortic arch: Great vessel origins are patent. Right carotid system: . Intraluminal thrombus skull base is detailed below in the CTA head findings. Otherwise, no evidence of dissection, stenosis (50% or greater) or occlusion Left carotid system: No evidence of dissection, stenosis (50% or greater) or occlusion. Vertebral arteries: Codominant. No evidence of dissection, stenosis (50% or greater) or occlusion. Skeleton: Moderate multilevel degenerative disc disease. Other neck: No mass or adenopathy. Upper chest: Visualized lung apices are clear. Review of the MIP images confirms the above findings CTA HEAD FINDINGS Anterior circulation: Evidence of intraluminal thrombus within the internal carotid artery at the skull base with contrast surrounding the rounded filling defect (for example series 6, image 233). Thrombus extending into the proximal petrous internal carotid artery with resulting high-grade, nearly occlusive stenosis. More distal petrous and cavernous ICA is patent. Absent right A1 ACA, likely congenital. Otherwise, bilateral proximal MCAs and ACAs are patent. Multifocal mild stenosis of the right ACA. Posterior circulation: Small vertebrobasilar system with bilateral fetal type PCAs anatomic variant. The left intradural vertebral artery is small throughout its course and makes a small contribution to the basilar artery and the larger contribution to the left PICA. Bilateral posterior cerebral arteries are patent without evidence of proximal hemodynamically significant stenosis. No aneurysm. Venous sinuses: As permitted by contrast timing, patent. Review of the MIP images confirms the above findings IMPRESSION: 1. 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 While indeterminate in etiology, findings could potentially be secondary to dissection given the patient's relative lack of atherosclerosis elsewhere. Catheter arteriogram could further characterize if clinically indicated. 2. Otherwise, no significant proximal stenosis in the head or neck. Findings discussed with Dr. Amie Portland, MD via telephone at 5:14 PM. Electronically Signed   By: Margaretha Sheffield MD   On: 09/09/2020 17:21   CT HEAD WO CONTRAST  Result Date: 09/09/2020 CLINICAL DATA:  Acute neuro deficit. EXAM: CT HEAD WITHOUT CONTRAST TECHNIQUE: Contiguous axial images were obtained from the base of the skull through the vertex without intravenous contrast. COMPARISON:  CT head 04/29/2010 FINDINGS: Brain: Cortically based hypodensity in the high right parietal lobe is ill-defined and appears acute. This was not present previously. No other areas of abnormality. No hemorrhage. Ventricle size normal. Vascular: Negative for hyperdense vessel Skull: Negative Sinuses/Orbits: Mucosal edema paranasal sinuses.  No orbital lesion. Other: None IMPRESSION: Cortically based hypodensity  in the high right parietal lobe. This has the appearance of acute or subacute infarct. No acute hemorrhage. These results were called by telephone at the time of interpretation on 09/09/2020 at 2:36 pm to provider [facility] , who verbally acknowledged these results. Electronically Signed   By: Franchot Gallo M.D.   On: 09/09/2020 14:36   CT ANGIO NECK W OR WO CONTRAST  Result Date: 09/09/2020 CLINICAL DATA:  Neuro deficit, acute stroke suspected. EXAM: CT ANGIOGRAPHY HEAD AND NECK TECHNIQUE: Multidetector CT imaging of the head and neck was performed using the standard protocol during bolus administration of intravenous contrast. Multiplanar CT image reconstructions and MIPs were obtained to evaluate the vascular anatomy. Carotid stenosis measurements (when applicable) are  obtained utilizing NASCET criteria, using the distal internal carotid diameter as the denominator. CONTRAST:  22mL OMNIPAQUE IOHEXOL 350 MG/ML SOLN COMPARISON:  Same day CT head. FINDINGS: CTA NECK FINDINGS Aortic arch: Great vessel origins are patent. Right carotid system: . Intraluminal thrombus skull base is detailed below in the CTA head findings. Otherwise, no evidence of dissection, stenosis (50% or greater) or occlusion Left carotid system: No evidence of dissection, stenosis (50% or greater) or occlusion. Vertebral arteries: Codominant. No evidence of dissection, stenosis (50% or greater) or occlusion. Skeleton: Moderate multilevel degenerative disc disease. Other neck: No mass or adenopathy. Upper chest: Visualized lung apices are clear. Review of the MIP images confirms the above findings CTA HEAD FINDINGS Anterior circulation: Evidence of intraluminal thrombus within the internal carotid artery at the skull base with contrast surrounding the rounded filling defect (for example series 6, image 233). Thrombus extending into the proximal petrous internal carotid artery with resulting high-grade, nearly occlusive stenosis. More distal petrous and cavernous ICA is patent. Absent right A1 ACA, likely congenital. Otherwise, bilateral proximal MCAs and ACAs are patent. Multifocal mild stenosis of the right ACA. Posterior circulation: Small vertebrobasilar system with bilateral fetal type PCAs anatomic variant. The left intradural vertebral artery is small throughout its course and makes a small contribution to the basilar artery and the larger contribution to the left PICA. Bilateral posterior cerebral arteries are patent without evidence of proximal hemodynamically significant stenosis. No aneurysm. Venous sinuses: As permitted by contrast timing, patent. Review of the MIP images confirms the above findings IMPRESSION: 1. Findings concerning for intraluminal thrombus in the right internal carotid artery at the  skull base, extending into the petrous internal carotid artery with high-grade (near occlusive) stenosis with more distal opacification of the ICA intracranially. While indeterminate in etiology, findings could potentially be secondary to dissection given the patient's relative lack of atherosclerosis elsewhere. Catheter arteriogram could further characterize if clinically indicated. 2. Otherwise, no significant proximal stenosis in the head or neck. Findings discussed with Dr. Amie Portland, MD via telephone at 5:14 PM. Electronically Signed   By: Margaretha Sheffield MD   On: 09/09/2020 17:21   MR BRAIN W WO CONTRAST  Result Date: 09/09/2020 CLINICAL DATA:  Neuro deficit, acute stroke suspected. EXAM: MRI HEAD WITHOUT AND WITH CONTRAST TECHNIQUE: Multiplanar, multiecho pulse sequences of the brain and surrounding structures were obtained without and with intravenous contrast. CONTRAST:  33mL GADAVIST GADOBUTROL 1 MMOL/ML IV SOLN COMPARISON:  Same day CT exams. FINDINGS: Brain: Acute right parieto-occipital cortical infarct. Associated edema without mass effect. No acute hemorrhage. No midline shift. Basal cisterns are patent. No hydrocephalus. No extra-axial fluid collection. No abnormal enhancement. Vascular: Poor flow void within the right ICA at the skull base with filling defect on postcontrast  imaging, consistent with findings described on same day CTA. Arachnoid granulations in the posterior aspect of the superior sagittal sinus and the right transverse sinus. Skull and upper cervical spine: Normal marrow signal. Sinuses/Orbits: Scattered mucosal thickening of ethmoid air cells and frontal sinuses. Other: No mastoid effusions. IMPRESSION: 1. Acute right parieto-occipital cortical infarct. Associated edema without mass effect. 2. See same day CTA for characterization of vascular findings. 3. Frontoethmoidal paranasal sinus mucosal thickening. Electronically Signed   By: Margaretha Sheffield MD   On: 09/09/2020  19:39   ECHOCARDIOGRAM COMPLETE  Result Date: 09/10/2020    ECHOCARDIOGRAM REPORT   Patient Name:   PATRICK SALEMI Date of Exam: 09/10/2020 Medical Rec #:  [id number]           Height:       72.0 in Accession #:    [id number]          Weight:       214.7 lb Date of Birth:  08/12/1950          BSA:          2.196 m Patient Age:    52 years            BP:           142/73 mmHg Patient Gender: M                   HR:           67 bpm. Exam Location:  [facility] Procedure: 2D Echo, Color Doppler, Cardiac Doppler and Strain Analysis Indications:     I63.9 Stroke  History:         Patient has no prior history of Echocardiogram examinations.                  Signs/Symptoms:Headache. Asthma.  Sonographer:     Charmayne Sheer RDCS (AE) Referring Phys:  [id number] Collier Bullock Diagnosing Phys: Bartholome Bill MD  Sonographer Comments: Suboptimal parasternal window. Global longitudinal strain was attempted. IMPRESSIONS  1. Left ventricular ejection fraction, by estimation, is 60 to 65%. The left ventricle has normal function. The left ventricle has no regional wall motion abnormalities. Left ventricular diastolic parameters were normal.  2. Right ventricular systolic function is normal. The right ventricular size is normal.  3. The mitral valve is grossly normal. No evidence of mitral valve regurgitation.  4. The aortic valve is grossly normal. Aortic valve regurgitation is not visualized. FINDINGS  Left Ventricle: Left ventricular ejection fraction, by estimation, is 60 to 65%. The left ventricle has normal function. The left ventricle has no regional wall motion abnormalities. The left ventricular internal cavity size was normal in size. There is  borderline left ventricular hypertrophy. Left ventricular diastolic parameters were normal. Right Ventricle: The right ventricular size is normal. No increase in right ventricular wall thickness. Right ventricular systolic function is normal. Left Atrium: Left atrial size was normal in  size. Right Atrium: Right atrial size was normal in size. Pericardium: There is no evidence of pericardial effusion. Mitral Valve: The mitral valve is grossly normal. No evidence of mitral valve regurgitation. MV peak gradient, 3.0 mmHg. The mean mitral valve gradient is 1.0 mmHg. Tricuspid Valve: The tricuspid valve is not well visualized. Tricuspid valve regurgitation is trivial. Aortic Valve: The aortic valve is grossly normal. Aortic valve regurgitation is not visualized. Aortic valve mean gradient measures 4.0 mmHg. Aortic valve peak gradient measures 6.9 mmHg. Aortic valve area, by VTI measures 2.49 cm. Pulmonic  Valve: The pulmonic valve was not well visualized. Pulmonic valve regurgitation is trivial. Aorta: The aortic root is normal in size and structure. IAS/Shunts: The interatrial septum was not well visualized.  LEFT VENTRICLE PLAX 2D LVIDd:         4.80 cm  Diastology LVIDs:         3.10 cm  LV e' medial:    10.40 cm/s LV PW:         1.00 cm  LV E/e' medial:  7.9 LV IVS:        0.80 cm  LV e' lateral:   14.50 cm/s LVOT diam:     2.10 cm  LV E/e' lateral: 5.6 LV SV:         65 LV SV Index:   30 LVOT Area:     3.46 cm  RIGHT VENTRICLE RV Basal diam:  2.90 cm LEFT ATRIUM             Index       RIGHT ATRIUM           Index LA diam:        3.80 cm 1.73 cm/m  RA Area:     14.50 cm LA Vol (A2C):   34.8 ml 15.85 ml/m RA Volume:   36.70 ml  16.71 ml/m LA Vol (A4C):   41.2 ml 18.76 ml/m LA Biplane Vol: 39.0 ml 17.76 ml/m  AORTIC VALVE                   PULMONIC VALVE AV Area (Vmax):    2.64 cm    PV Vmax:       1.03 m/s AV Area (Vmean):   2.51 cm    PV Vmean:      67.500 cm/s AV Area (VTI):     2.49 cm    PV VTI:        0.167 m AV Vmax:           131.00 cm/s PV Peak grad:  4.2 mmHg AV Vmean:          90.500 cm/s PV Mean grad:  2.0 mmHg AV VTI:            0.262 m AV Peak Grad:      6.9 mmHg AV Mean Grad:      4.0 mmHg LVOT Vmax:         99.90 cm/s LVOT Vmean:        65.700 cm/s LVOT VTI:          0.188  m LVOT/AV VTI ratio: 0.72  AORTA Ao Root diam: 3.50 cm MITRAL VALVE MV Area (PHT): 3.81 cm    SHUNTS MV Area VTI:   2.66 cm    Systemic VTI:  0.19 m MV Peak grad:  3.0 mmHg    Systemic Diam: 2.10 cm MV Mean grad:  1.0 mmHg MV Vmax:       0.87 m/s MV Vmean:      54.8 cm/s MV Decel Time: 199 msec MV E velocity: 81.80 cm/s MV A velocity: 79.70 cm/s MV E/A ratio:  1.03 Bartholome Bill MD Electronically signed by Bartholome Bill MD Signature Date/Time: 09/10/2020/4:56:24 PM    Final       Subjective: Pt denies any complaints    Discharge Exam: Vitals:   09/13/20 0437 09/13/20 0753  BP: 129/71 (!) 138/94  Pulse: 79 76  Resp: 18 18  Temp: 98.6 F (37 C) 98.2 F (36.8 C)  SpO2:  100% 100%   Vitals:   09/12/20 1556 09/12/20 2001 09/13/20 0437 09/13/20 0753  BP:  (!) 141/83 129/71 (!) 138/94  Pulse:  96 79 76  Resp:  18 18 18   Temp:  99.3 F (37.4 C) 98.6 F (37 C) 98.2 F (36.8 C)  TempSrc:  Oral Oral Oral  SpO2:  98% 100% 100%  Weight: 92.8 kg  93.4 kg   Height: 6' (1.829 m)       General: Pt is alert, awake, not in acute distress Cardiovascular: S1/S2 +, no rubs, no gallops Respiratory: CTA bilaterally, no wheezing, no rhonchi Abdominal: Soft, NT, ND, bowel sounds + Extremities: no edema, no cyanosis    The results of significant diagnostics from this hospitalization (including imaging, microbiology, ancillary and laboratory) are listed below for reference.     Microbiology: Recent Results (from the past 240 hour(s))  Resp Panel by RT-PCR (Flu A&B, Covid) Nasopharyngeal Swab     Status: None   Collection Time: 09/09/20  5:32 PM   Specimen: Nasopharyngeal Swab; Nasopharyngeal(NP) swabs in vial transport medium  Result Value Ref Range Status   SARS Coronavirus 2 by RT PCR NEGATIVE NEGATIVE Final    Comment: (NOTE) SARS-CoV-2 target nucleic acids are NOT DETECTED.  The SARS-CoV-2 RNA is generally detectable in upper respiratory specimens during the acute phase of infection.  The lowest concentration of SARS-CoV-2 viral copies this assay can detect is 138 copies/mL. A negative result does not preclude SARS-Cov-2 infection and should not be used as the sole basis for treatment or other patient management decisions. A negative result may occur with  improper specimen collection/handling, submission of specimen other than nasopharyngeal swab, presence of viral mutation(s) within the areas targeted by this assay, and inadequate number of viral copies(<138 copies/mL). A negative result must be combined with clinical observations, patient history, and epidemiological information. The expected result is Negative.  Fact Sheet for Patients:  EntrepreneurPulse.com.au  Fact Sheet for Healthcare Providers:  IncredibleEmployment.be  This test is no t yet approved or cleared by the [place] FDA and  has been authorized for detection and/or diagnosis of SARS-CoV-2 by FDA under an Emergency Use Authorization (EUA). This EUA will remain  in effect (meaning this test can be used) for the duration of the COVID-19 declaration under Section 564(b)(1) of the Act, 21 U.S.C.section 360bbb-3(b)(1), unless the authorization is terminated  or revoked sooner.       Influenza A by PCR NEGATIVE NEGATIVE Final   Influenza B by PCR NEGATIVE NEGATIVE Final    Comment: (NOTE) The Xpert Xpress SARS-CoV-2/FLU/RSV plus assay is intended as an aid in the diagnosis of influenza from Nasopharyngeal swab specimens and should not be used as a sole basis for treatment. Nasal washings and aspirates are unacceptable for Xpert Xpress SARS-CoV-2/FLU/RSV testing.  Fact Sheet for Patients: EntrepreneurPulse.com.au  Fact Sheet for Healthcare Providers: IncredibleEmployment.be  This test is not yet approved or cleared by the [place] FDA and has been authorized for detection and/or diagnosis of SARS-CoV-2 by FDA  under an Emergency Use Authorization (EUA). This EUA will remain in effect (meaning this test can be used) for the duration of the COVID-19 declaration under Section 564(b)(1) of the Act, 21 U.S.C. section 360bbb-3(b)(1), unless the authorization is terminated or revoked.  Performed at [facility], [address] [place], [place] [address]   MRSA PCR Screening     Status: None   Collection Time: 09/10/20  2:43 AM   Specimen: Nasopharyngeal  Result  Value Ref Range Status   MRSA by PCR NEGATIVE NEGATIVE Final    Comment:        The GeneXpert MRSA Assay (FDA approved for NASAL specimens only), is one component of a comprehensive MRSA colonization surveillance program. It is not intended to diagnose MRSA infection nor to guide or monitor treatment for MRSA infections. Performed at [facility], [address] [place], [place] [address]      Labs: BNP (last 3 results) No results for input(s): BNP in the last 8760 hours. Basic Metabolic Panel: Recent Labs  Lab 09/09/20 1354 09/11/20 0349 09/12/20 0045 09/13/20 0522  NA 138 139 139 139  K 3.6 3.6 3.2* 4.0  CL 103 104 104 101  CO2 26 27 26 26   GLUCOSE 128* 109* 153* 122*  BUN 12 18 17  24*  CREATININE 0.92 0.90 1.01 0.92  CALCIUM 9.2 8.9 8.9 9.6  MG  --   --  1.9  --    Liver Function Tests: Recent Labs  Lab 09/09/20 1354  AST 21  ALT 21  ALKPHOS 63  BILITOT 0.9  PROT 7.5  ALBUMIN 4.0   No results for input(s): LIPASE, AMYLASE in the last 168 hours. No results for input(s): AMMONIA in the last 168 hours. CBC: Recent Labs  Lab 09/09/20 1354 09/10/20 0429 09/11/20 0349 09/12/20 0045 09/13/20 0522  WBC 7.8 6.7 6.5 7.1 10.9*  NEUTROABS 6.7  --   --   --   --   HGB 13.3 12.7* 13.2 12.8* 13.3  HCT 40.2 38.9* 40.5 39.3 39.3  MCV 74.0* 75.7* 74.9* 75.7* 73.6*  PLT 184 172 178 175 196   Cardiac Enzymes: No results for input(s): CKTOTAL, CKMB, CKMBINDEX, TROPONINI in the last 168  hours. BNP: Invalid input(s): POCBNP CBG: Recent Labs  Lab 09/10/20 0251  GLUCAP 109*   D-Dimer No results for input(s): DDIMER in the last 72 hours. Hgb A1c No results for input(s): HGBA1C in the last 72 hours. Lipid Profile No results for input(s): CHOL, HDL, LDLCALC, TRIG, CHOLHDL, LDLDIRECT in the last 72 hours. Thyroid function studies No results for input(s): TSH, T4TOTAL, T3FREE, THYROIDAB in the last 72 hours.  Invalid input(s): FREET3 Anemia work up No results for input(s): VITAMINB12, FOLATE, FERRITIN, TIBC, IRON, RETICCTPCT in the last 72 hours. Urinalysis No results found for: COLORURINE, APPEARANCEUR, [place], [place], GLUCOSEU, [place], [place], [place], PROTEINUR, UROBILINOGEN, NITRITE, LEUKOCYTESUR Sepsis Labs Invalid input(s): PROCALCITONIN,  WBC,  LACTICIDVEN Microbiology Recent Results (from the past 240 hour(s))  Resp Panel by RT-PCR (Flu A&B, Covid) Nasopharyngeal Swab     Status: None   Collection Time: 09/09/20  5:32 PM   Specimen: Nasopharyngeal Swab; Nasopharyngeal(NP) swabs in vial transport medium  Result Value Ref Range Status   SARS Coronavirus 2 by RT PCR NEGATIVE NEGATIVE Final    Comment: (NOTE) SARS-CoV-2 target nucleic acids are NOT DETECTED.  The SARS-CoV-2 RNA is generally detectable in upper respiratory specimens during the acute phase of infection. The lowest concentration of SARS-CoV-2 viral copies this assay can detect is 138 copies/mL. A negative result does not preclude SARS-Cov-2 infection and should not be used as the sole basis for treatment or other patient management decisions. A negative result may occur with  improper specimen collection/handling, submission of specimen other than nasopharyngeal swab, presence of viral mutation(s) within the areas targeted by this assay, and inadequate number of viral copies(<138 copies/mL). A negative result must be combined with clinical observations, patient history, and  epidemiological information. The  expected result is Negative.  Fact Sheet for Patients:  EntrepreneurPulse.com.au  Fact Sheet for Healthcare Providers:  IncredibleEmployment.be  This test is no t yet approved or cleared by the [place] FDA and  has been authorized for detection and/or diagnosis of SARS-CoV-2 by FDA under an Emergency Use Authorization (EUA). This EUA will remain  in effect (meaning this test can be used) for the duration of the COVID-19 declaration under Section 564(b)(1) of the Act, 21 U.S.C.section 360bbb-3(b)(1), unless the authorization is terminated  or revoked sooner.       Influenza A by PCR NEGATIVE NEGATIVE Final   Influenza B by PCR NEGATIVE NEGATIVE Final    Comment: (NOTE) The Xpert Xpress SARS-CoV-2/FLU/RSV plus assay is intended as an aid in the diagnosis of influenza from Nasopharyngeal swab specimens and should not be used as a sole basis for treatment. Nasal washings and aspirates are unacceptable for Xpert Xpress SARS-CoV-2/FLU/RSV testing.  Fact Sheet for Patients: EntrepreneurPulse.com.au  Fact Sheet for Healthcare Providers: IncredibleEmployment.be  This test is not yet approved or cleared by the [place] FDA and has been authorized for detection and/or diagnosis of SARS-CoV-2 by FDA under an Emergency Use Authorization (EUA). This EUA will remain in effect (meaning this test can be used) for the duration of the COVID-19 declaration under Section 564(b)(1) of the Act, 21 U.S.C. section 360bbb-3(b)(1), unless the authorization is terminated or revoked.  Performed at [facility], [address] [place], [place] [address]   MRSA PCR Screening     Status: None   Collection Time: 09/10/20  2:43 AM   Specimen: Nasopharyngeal  Result Value Ref Range Status   MRSA by PCR NEGATIVE NEGATIVE Final    Comment:        The GeneXpert MRSA Assay  (FDA approved for NASAL specimens only), is one component of a comprehensive MRSA colonization surveillance program. It is not intended to diagnose MRSA infection nor to guide or monitor treatment for MRSA infections. Performed at [facility], [address], [place], [place] [address]      Time coordinating discharge: Over 30 minutes  SIGNED:   Wyvonnia Dusky, MD  [facility] 09/13/2020, 11:00 AM Pager   If 7PM-7AM, please contact night-coverage

## 2020-09-16 DIAGNOSIS — Z7901 Long term (current) use of anticoagulants: Secondary | ICD-10-CM | POA: Insufficient documentation

## 2020-09-16 DIAGNOSIS — Z8673 Personal history of transient ischemic attack (TIA), and cerebral infarction without residual deficits: Secondary | ICD-10-CM | POA: Diagnosis not present

## 2020-09-16 DIAGNOSIS — J321 Chronic frontal sinusitis: Secondary | ICD-10-CM | POA: Diagnosis not present

## 2020-09-18 DIAGNOSIS — H40013 Open angle with borderline findings, low risk, bilateral: Secondary | ICD-10-CM | POA: Diagnosis not present

## 2020-09-18 DIAGNOSIS — H1045 Other chronic allergic conjunctivitis: Secondary | ICD-10-CM | POA: Diagnosis not present

## 2020-09-18 DIAGNOSIS — H25813 Combined forms of age-related cataract, bilateral: Secondary | ICD-10-CM | POA: Diagnosis not present

## 2020-09-18 DIAGNOSIS — H04123 Dry eye syndrome of bilateral lacrimal glands: Secondary | ICD-10-CM | POA: Diagnosis not present

## 2020-09-18 DIAGNOSIS — I639 Cerebral infarction, unspecified: Secondary | ICD-10-CM | POA: Diagnosis not present

## 2020-09-19 DIAGNOSIS — E785 Hyperlipidemia, unspecified: Secondary | ICD-10-CM | POA: Diagnosis not present

## 2020-09-19 DIAGNOSIS — Z8673 Personal history of transient ischemic attack (TIA), and cerebral infarction without residual deficits: Secondary | ICD-10-CM | POA: Diagnosis not present

## 2020-09-19 DIAGNOSIS — I700 Atherosclerosis of aorta: Secondary | ICD-10-CM | POA: Diagnosis not present

## 2020-09-19 DIAGNOSIS — R432 Parageusia: Secondary | ICD-10-CM | POA: Diagnosis not present

## 2020-09-19 DIAGNOSIS — K219 Gastro-esophageal reflux disease without esophagitis: Secondary | ICD-10-CM | POA: Diagnosis not present

## 2020-10-15 ENCOUNTER — Other Ambulatory Visit: Payer: Self-pay | Admitting: Gastroenterology

## 2020-10-15 DIAGNOSIS — Z86010 Personal history of colonic polyps: Secondary | ICD-10-CM | POA: Diagnosis not present

## 2020-10-15 DIAGNOSIS — R110 Nausea: Secondary | ICD-10-CM | POA: Diagnosis not present

## 2020-10-15 DIAGNOSIS — K219 Gastro-esophageal reflux disease without esophagitis: Secondary | ICD-10-CM | POA: Diagnosis not present

## 2020-10-15 DIAGNOSIS — Z8673 Personal history of transient ischemic attack (TIA), and cerebral infarction without residual deficits: Secondary | ICD-10-CM | POA: Diagnosis not present

## 2020-10-15 DIAGNOSIS — R438 Other disturbances of smell and taste: Secondary | ICD-10-CM | POA: Diagnosis not present

## 2020-11-05 ENCOUNTER — Ambulatory Visit
Admission: RE | Admit: 2020-11-05 | Discharge: 2020-11-05 | Disposition: A | Discharge status: Home / Self Care | Payer: PPO | Source: Ambulatory Visit | Attending: Gastroenterology | Admitting: Gastroenterology

## 2020-11-05 DIAGNOSIS — R110 Nausea: Secondary | ICD-10-CM

## 2020-11-05 DIAGNOSIS — K259 Gastric ulcer, unspecified as acute or chronic, without hemorrhage or perforation: Secondary | ICD-10-CM | POA: Diagnosis not present

## 2020-11-12 DIAGNOSIS — I63031 Cerebral infarction due to thrombosis of right carotid artery: Secondary | ICD-10-CM | POA: Diagnosis not present

## 2020-11-12 DIAGNOSIS — G629 Polyneuropathy, unspecified: Secondary | ICD-10-CM | POA: Diagnosis not present

## 2020-11-19 DIAGNOSIS — J019 Acute sinusitis, unspecified: Secondary | ICD-10-CM | POA: Diagnosis not present

## 2020-12-17 ENCOUNTER — Other Ambulatory Visit: Payer: Self-pay

## 2020-12-17 ENCOUNTER — Other Ambulatory Visit (HOSPITAL_BASED_OUTPATIENT_CLINIC_OR_DEPARTMENT_OTHER): Payer: Self-pay

## 2020-12-17 ENCOUNTER — Ambulatory Visit: Payer: PPO | Attending: Internal Medicine

## 2020-12-17 DIAGNOSIS — Z23 Encounter for immunization: Secondary | ICD-10-CM

## 2020-12-17 MED ORDER — PFIZER-BIONT COVID-19 VAC-TRIS 30 MCG/0.3ML IM SUSP
INTRAMUSCULAR | 0 refills | Status: DC
  Filled 2020-12-17: qty 0.3, 1d supply, fill #0

## 2020-12-17 NOTE — Progress Notes (Signed)
   Covid-19 Vaccination Clinic  Name:  William Hicks    MRN: [id number] DOB: 10-14-1950  12/17/2020  Mr. William Hicks was observed post Covid-19 immunization for 15 minutes without incident. He was provided with Vaccine Information Sheet and instruction to access the V-Safe system.   Mr. William Hicks was instructed to call 911 with any severe reactions post vaccine: Difficulty breathing  Swelling of face and throat  A fast heartbeat  A bad rash all over body  Dizziness and weakness   Immunizations Administered     Name Date Dose VIS Date Route   PFIZER Comrnaty(Gray TOP) Covid-19 Vaccine 12/17/2020  9:34 AM 0.3 mL 05/01/2020 Intramuscular   Manufacturer: [place]   Lot: [id number]   [place]: [phone]

## 2020-12-25 ENCOUNTER — Other Ambulatory Visit: Payer: Self-pay | Admitting: Psychiatry

## 2020-12-25 DIAGNOSIS — I63031 Cerebral infarction due to thrombosis of right carotid artery: Secondary | ICD-10-CM

## 2020-12-25 DIAGNOSIS — R5383 Other fatigue: Secondary | ICD-10-CM | POA: Diagnosis not present

## 2020-12-25 DIAGNOSIS — D649 Anemia, unspecified: Secondary | ICD-10-CM | POA: Diagnosis not present

## 2021-01-05 DIAGNOSIS — E876 Hypokalemia: Secondary | ICD-10-CM | POA: Diagnosis not present

## 2021-01-07 ENCOUNTER — Inpatient Hospital Stay: Admission: RE | Admit: 2021-01-07 | Payer: PPO | Source: Ambulatory Visit

## 2021-02-19 DIAGNOSIS — D229 Melanocytic nevi, unspecified: Secondary | ICD-10-CM | POA: Diagnosis not present

## 2021-03-05 ENCOUNTER — Ambulatory Visit: Payer: PPO | Attending: Internal Medicine

## 2021-03-05 DIAGNOSIS — Z23 Encounter for immunization: Secondary | ICD-10-CM

## 2021-03-05 NOTE — Progress Notes (Signed)
   Covid-19 Vaccination Clinic  Name:  William Hicks    MRN: [id number] DOB: [birth year]  03/05/2021  [name] was observed post Covid-19 immunization for 15 minutes without incident. He was provided with Vaccine Information Sheet and instruction to access the V-Safe system.   [name] was instructed to call 911 with any severe reactions post vaccine: Difficulty breathing  Swelling of face and throat  A fast heartbeat  A bad rash all over body  Dizziness and weakness

## 2021-03-13 DIAGNOSIS — Z111 Encounter for screening for respiratory tuberculosis: Secondary | ICD-10-CM | POA: Diagnosis not present

## 2021-03-16 ENCOUNTER — Telehealth: Payer: Self-pay | Admitting: Medical Oncology

## 2021-03-16 DIAGNOSIS — Z23 Encounter for immunization: Secondary | ICD-10-CM | POA: Diagnosis not present

## 2021-03-16 NOTE — Telephone Encounter (Signed)
Pt left a VM requesting an appt with Dr Julien Nordmann. No reason stated. PET [facility] [place]) scan showed " prostate cancer has spread to my lymph nodes".  I contacted his PCP Dr Perlie Mayo, PA-C regarding pt request for referral to cancer center.   Phyllis at Dr Perlie Mayo has a message to provider.

## 2021-03-18 NOTE — Telephone Encounter (Signed)
Appt requested.  Pt said he took a copy today of his scan and his records from the [place] to his PCP Dr Burney Gauze at [place] . He is wanting a referral ASAP to [facility] for his "prostate cancer " .

## 2021-03-18 NOTE — Telephone Encounter (Signed)
Second request from pt for an appt at [facility] for his prostate cancer.  Pt said he took a copy today of his scan and his records from the [place] to his PCP Dr Burney Gauze at [place] .  He is wanting a referral ASAP to [facility] for his "prostate cancer " .

## 2021-03-23 ENCOUNTER — Telehealth: Payer: Self-pay | Admitting: Licensed Clinical Social Worker

## 2021-03-23 ENCOUNTER — Telehealth: Payer: Self-pay | Admitting: Oncology

## 2021-03-23 NOTE — Telephone Encounter (Signed)
Pt called in to sch appt from 10/24 referral. Pt is aware of appt date and time.

## 2021-03-23 NOTE — Telephone Encounter (Signed)
Scheduled appt per 10/28 referral. Pt is aware of appt date and time.

## 2021-03-27 ENCOUNTER — Other Ambulatory Visit (HOSPITAL_BASED_OUTPATIENT_CLINIC_OR_DEPARTMENT_OTHER): Payer: Self-pay

## 2021-03-27 MED ORDER — PFIZER COVID-19 VAC BIVALENT 30 MCG/0.3ML IM SUSP
INTRAMUSCULAR | 0 refills | Status: DC
  Filled 2021-03-27: qty 0.3, 1d supply, fill #0

## 2021-04-03 ENCOUNTER — Ambulatory Visit
Admission: RE | Admit: 2021-04-03 | Discharge: 2021-04-03 | Disposition: A | Discharge status: Home / Self Care | Payer: Self-pay | Source: Ambulatory Visit | Attending: Oncology | Admitting: Oncology

## 2021-04-03 ENCOUNTER — Other Ambulatory Visit: Payer: Self-pay | Admitting: *Deleted

## 2021-04-03 ENCOUNTER — Inpatient Hospital Stay
Admission: RE | Admit: 2021-04-03 | Discharge: 2021-04-03 | Disposition: A | Discharge status: Home / Self Care | Payer: Self-pay | Source: Ambulatory Visit | Attending: Oncology | Admitting: Oncology

## 2021-04-03 ENCOUNTER — Other Ambulatory Visit (HOSPITAL_COMMUNITY): Payer: Self-pay | Admitting: Oncology

## 2021-04-03 DIAGNOSIS — C801 Malignant (primary) neoplasm, unspecified: Secondary | ICD-10-CM

## 2021-04-03 DIAGNOSIS — C61 Malignant neoplasm of prostate: Secondary | ICD-10-CM

## 2021-04-08 IMAGING — DX DG HAND COMPLETE 3+V*R*
3 series · 3 of 3 positions shown · non-contrast
Comparison: None.

CLINICAL DATA: Pain after injury 2 weeks ago

EXAM:
RIGHT HAND - COMPLETE 3+ VIEW

[hand pa]
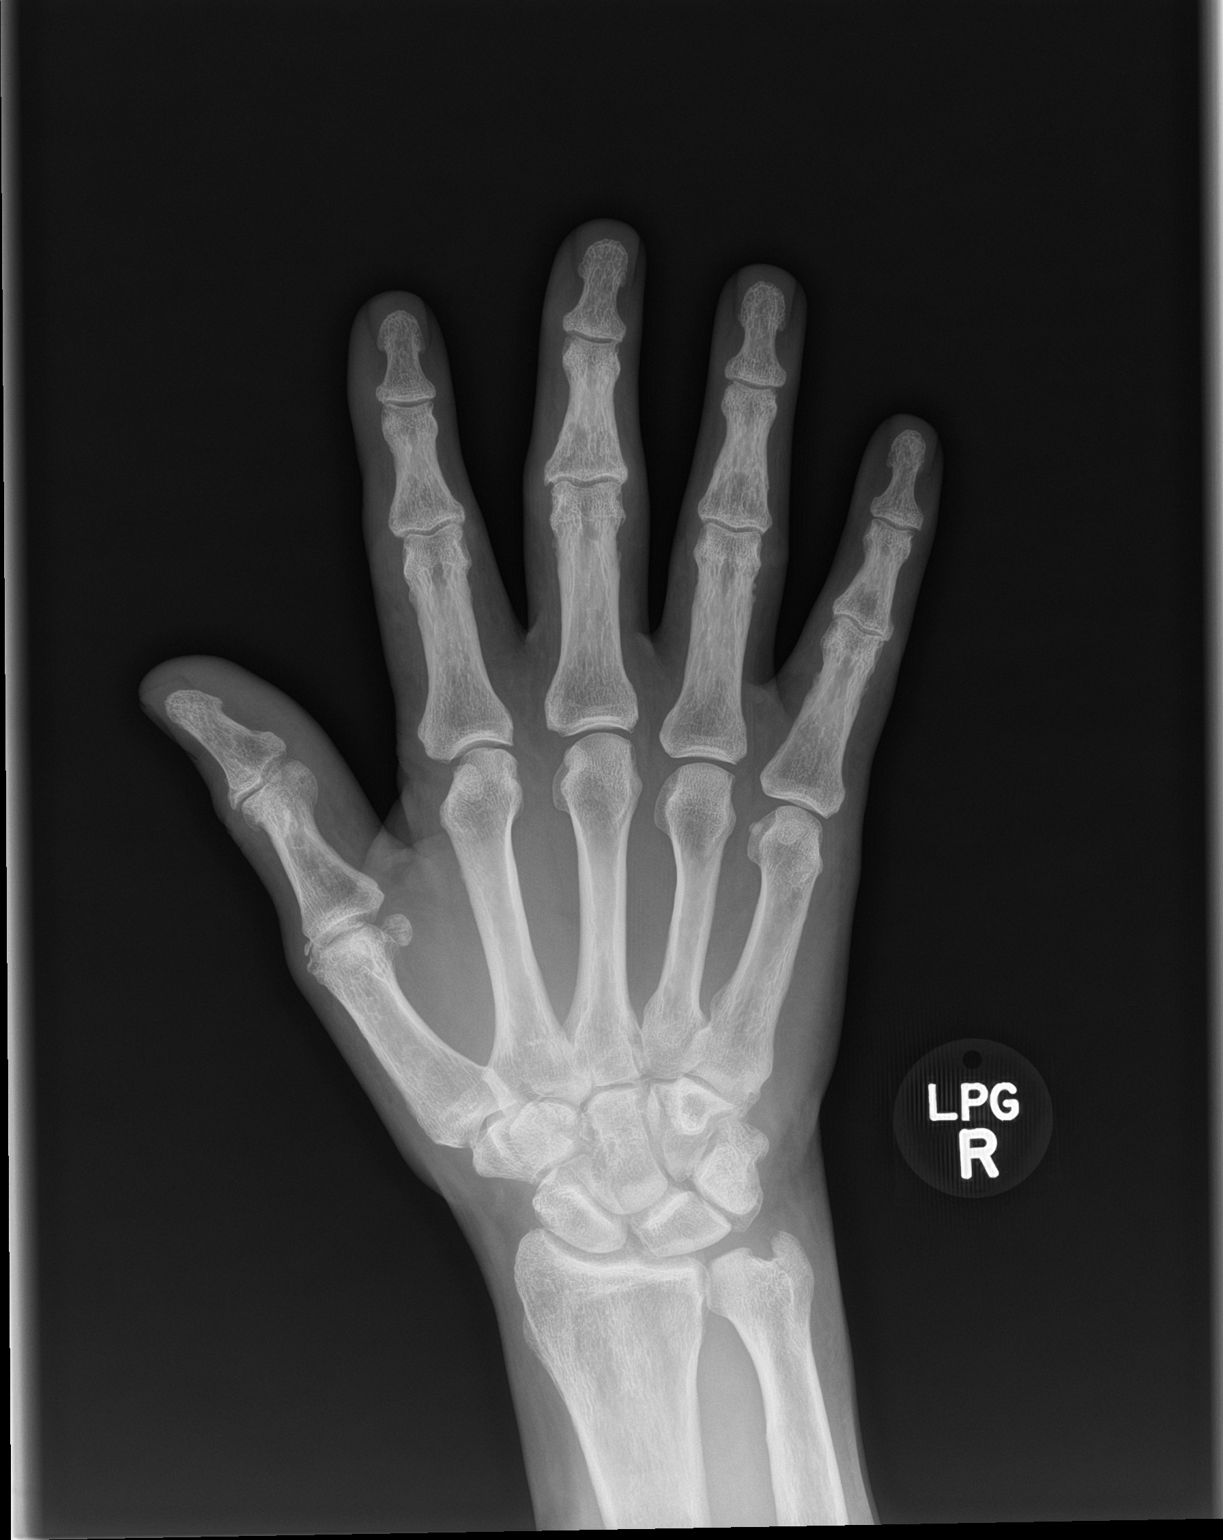

[hand obl]
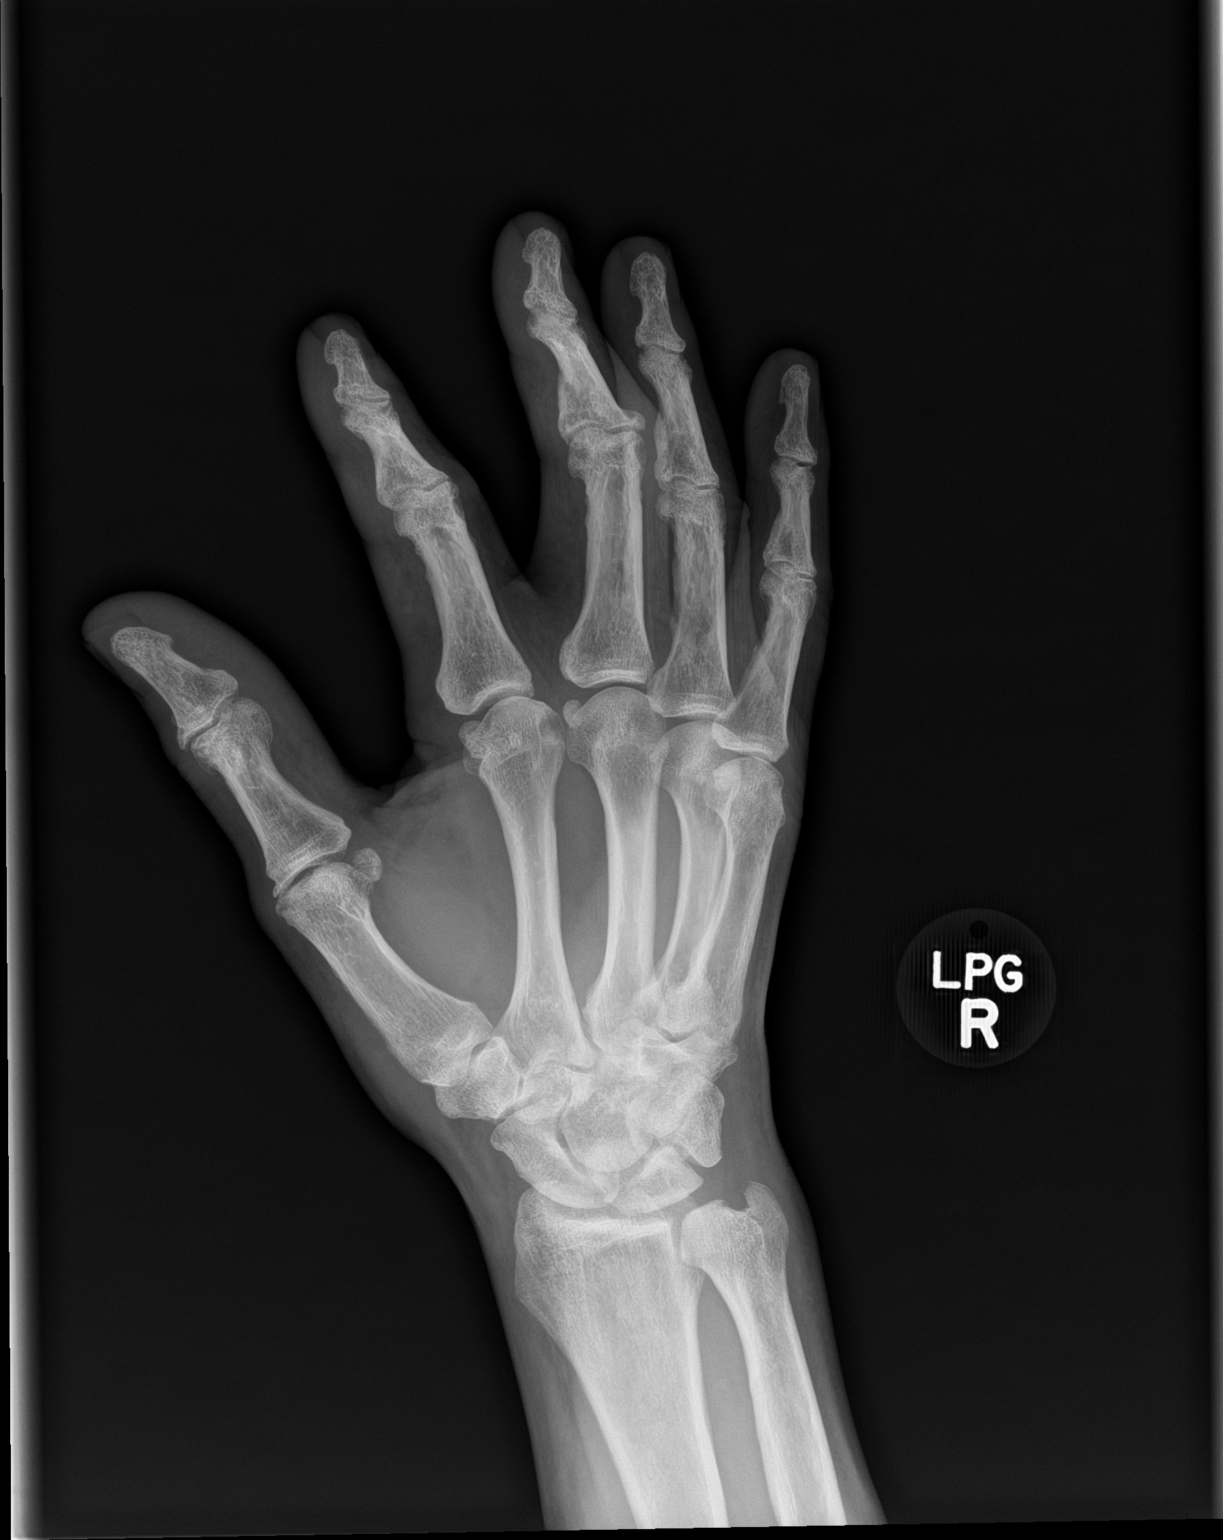

[hand lat]
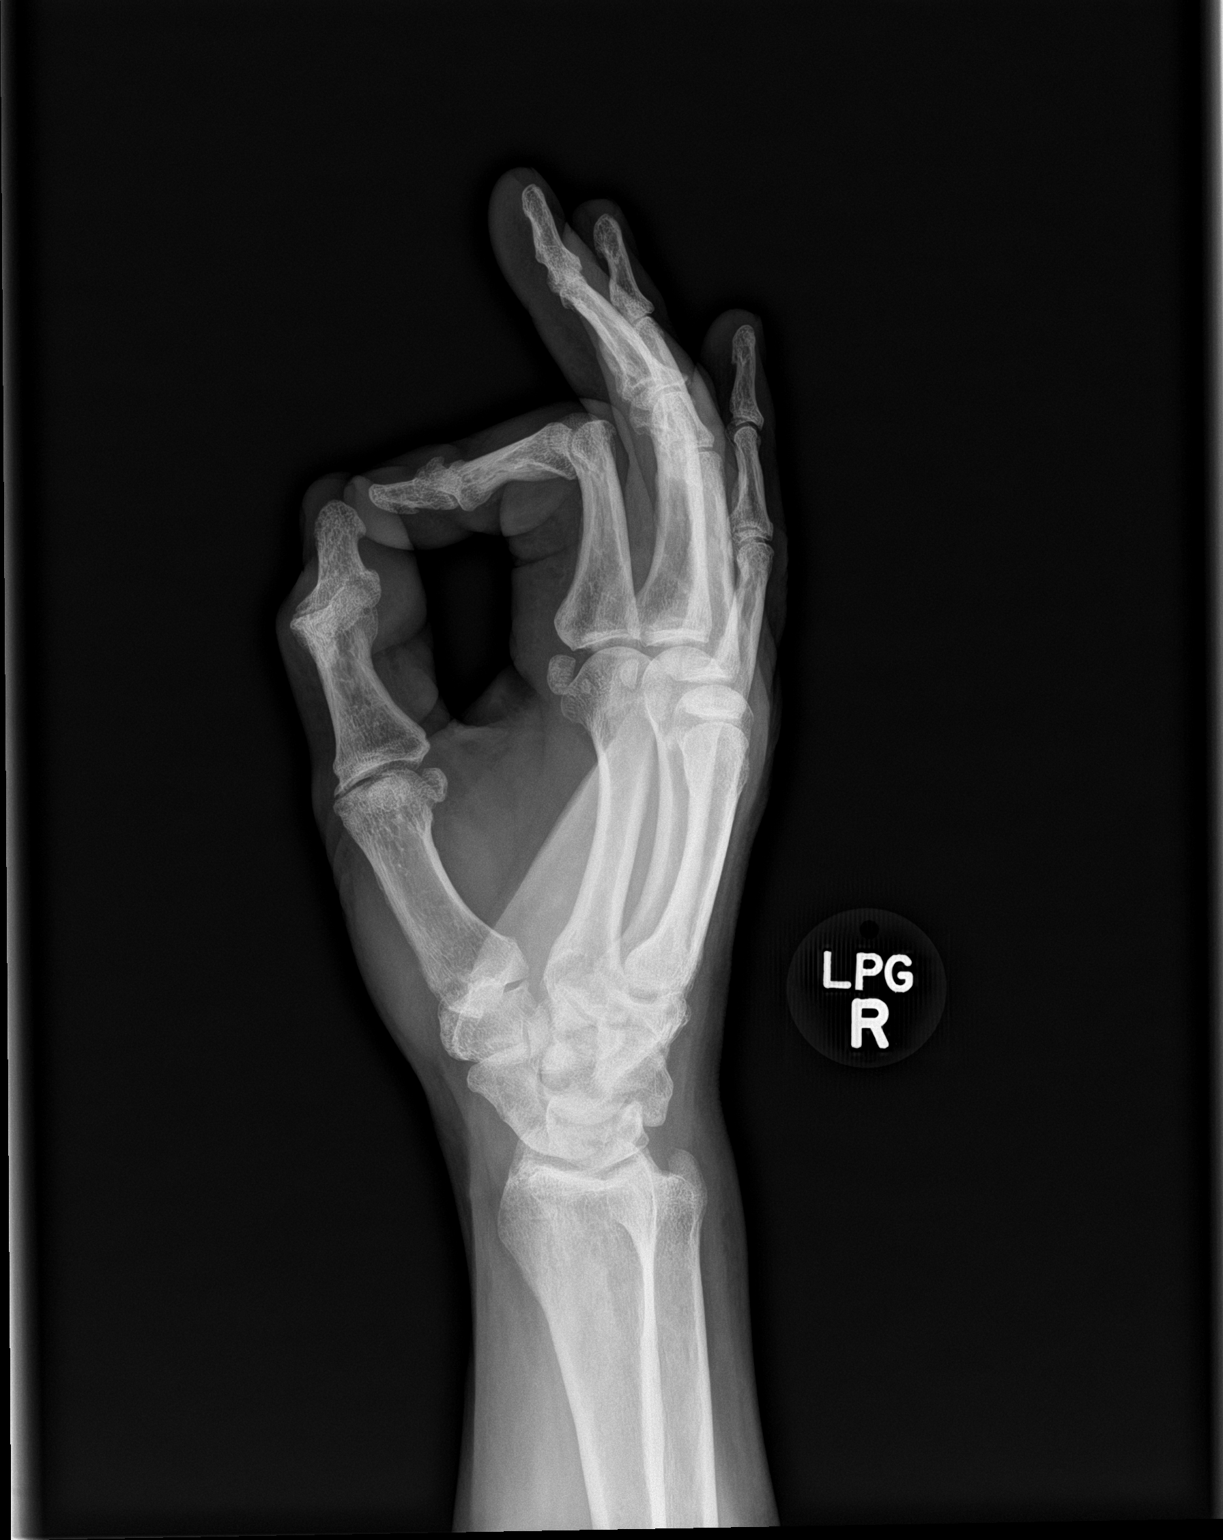

[3 of 3 positions shown; findings below may reference images not displayed]

FINDINGS: There is no evidence of fracture or dislocation. There is no
evidence of arthropathy or other focal bone abnormality. Soft
tissues are unremarkable.
IMPRESSION: Negative.

## 2021-04-09 ENCOUNTER — Inpatient Hospital Stay: Payer: PPO | Attending: Oncology | Admitting: Oncology

## 2021-04-09 ENCOUNTER — Other Ambulatory Visit: Payer: Self-pay

## 2021-04-09 VITALS — BP 147/72 | HR 72 | Temp 97.9°F | Resp 17 | Wt 217.6 lb

## 2021-04-09 DIAGNOSIS — C778 Secondary and unspecified malignant neoplasm of lymph nodes of multiple regions: Secondary | ICD-10-CM | POA: Diagnosis not present

## 2021-04-09 DIAGNOSIS — C61 Malignant neoplasm of prostate: Secondary | ICD-10-CM | POA: Diagnosis not present

## 2021-04-09 DIAGNOSIS — J449 Chronic obstructive pulmonary disease, unspecified: Secondary | ICD-10-CM | POA: Diagnosis not present

## 2021-04-09 NOTE — Progress Notes (Signed)
Introduced myself to patient as the prostate nurse navigator and discussed my role.  No barriers to care identified at this time.  He is here to discuss his treatment options with Dr. Shadad.  I gave him my business card and asked him to call me with questions or concerns.  Verbalized understanding.   

## 2021-04-09 NOTE — Progress Notes (Signed)
Reason for the request:     Prostate cancer  HPI: I was asked by [name] to evaluate Mr. William Hicks for the evaluation of prostate cancer.  He is a gentleman with history of nephrolithiasis, COPD as well as diagnosis of prostate cancer.  He gets his oncology care at the [facility] in [place].  He was originally diagnosed in 2011 and received definitive therapy with radiation and seed implants under the care of Dr. Sondra Come.  The details of his initial diagnosis and treatments are not available to me.   He remained on active surveillance and developed biochemical relapse in the last 3 to 4 years and has been on androgen deprivation therapy utilizing Eligard.  Although he has been receiving it apparently every 6 months he has been receiving treatment breaks.    His most recent evaluation in October 2022 showed a PSA of 7.22 and testosterone level 263.  PSMA PET scan obtained in October 2022 showed small but intensely avid lymph nodes in the chest abdomen and pelvis compatible with metastatic disease.  Clinically, he reports no symptoms related to his prostate cancer hormone therapy has been restarted and scheduled to restart Eligard on 04/10/2021.  He does not report any headaches, blurry vision, syncope or seizures. Does not report any fevers, chills or sweats.  Does not report any cough, wheezing or hemoptysis.  Does not report any chest pain, palpitation, orthopnea or leg edema.  Does not report any nausea, vomiting or abdominal pain.  Does not report any constipation or diarrhea.  Does not report any skeletal complaints.    Does not report frequency, urgency or hematuria.  Does not report any skin rashes or lesions. Does not report any heat or cold intolerance.  Does not report any lymphadenopathy or petechiae.  Does not report any anxiety or depression.  Remaining review of systems is negative.     Past Medical History:  Diagnosis Date   Arthritis    Asthma    Epistaxis    GERD  (gastroesophageal reflux disease)    Kidney stones    Prostate cancer ([place])    Sinusitis   :   Past Surgical History:  Procedure Laterality Date   LITHOTRIPSY  2008  :   Current Outpatient Medications:    albuterol (VENTOLIN HFA) 108 (90 Base) MCG/ACT inhaler, Inhale into the lungs., Disp: , Rfl:    atorvastatin (LIPITOR) 80 MG tablet, Take 1 tablet (80 mg total) by mouth daily., Disp: 30 tablet, Rfl: 0   Baclofen 5 MG TABS, Take 1 tablet by mouth 3 (three) times daily., Disp: , Rfl:    Cholecalciferol 25 MCG (1000 UT) tablet, Take 1 tablet by mouth daily., Disp: , Rfl:    COVID-19 mRNA bivalent vaccine, Pfizer, (PFIZER COVID-19 VAC BIVALENT) injection, Inject into the muscle., Disp: 0.3 mL, Rfl: 0   COVID-19 mRNA Vac-TriS, Pfizer, (PFIZER-BIONT COVID-19 VAC-TRIS) SUSP injection, Inject into the muscle., Disp: 0.3 mL, Rfl: 0   cromolyn (OPTICROM) 4 % ophthalmic solution, 1 drop 4 (four) times daily., Disp: , Rfl:    fluticasone (FLONASE) 50 MCG/ACT nasal spray, Place into the nose., Disp: , Rfl:    montelukast (SINGULAIR) 10 MG tablet, Take 1 tablet (10 mg total) by mouth at bedtime. (Patient not taking: No sig reported), Disp: 90 tablet, Rfl: 2   pantoprazole (PROTONIX) 40 MG tablet, TAKE ONE TABLET BY MOUTH EVERY DAY TO CONTROL STOMACH ACID, Disp: , Rfl:    pseudoephedrine (SUDAFED) 30 MG tablet, 2  tablets as needed, Disp: , Rfl:    triamcinolone (NASACORT) 55 MCG/ACT AERO nasal inhaler, Place 1 spray into the nose daily. (Patient not taking: No sig reported), Disp: , Rfl: :   Allergies  Allergen Reactions   Aspirin Diarrhea and Nausea And Vomiting  :   Family History  Problem Relation Age of Onset   Alzheimer's disease Father   :   Social History   Socioeconomic History   Marital status: Married    Spouse name: Not on file   Number of children: Not on file   Years of education: Not on file   Highest education level: Not on file  Occupational History   Not on file   Tobacco Use   Smoking status: Former   Smokeless tobacco: Never  Substance and Sexual Activity   Alcohol use: Yes   Drug use: No   Sexual activity: Not on file  Other Topics Concern   Not on file  Social History Narrative   Not on file   Social Determinants of Health   Financial Resource Strain: Not on file  Food Insecurity: Not on file  Transportation Needs: Not on file  Physical Activity: Not on file  Stress: Not on file  Social Connections: Not on file  Intimate Partner Violence: Not on file  :  Pertinent items are noted in HPI.  Exam: Blood pressure (!) 147/72, pulse 72, temperature 97.9 F (36.6 C), temperature source Temporal, resp. rate 17, weight 217 lb 9.6 oz (98.7 kg), SpO2 100 %. ECOG 0 General appearance: alert and cooperative appeared without distress. Head: atraumatic without any abnormalities. Eyes: conjunctivae/corneas clear. PERRL.  Sclera anicteric. Throat: lips, mucosa, and tongue normal; without oral thrush or ulcers. Resp: clear to auscultation bilaterally without rhonchi, wheezes or dullness to percussion. Cardio: regular rate and rhythm, S1, S2 normal, no murmur, click, rub or gallop GI: soft, non-tender; bowel sounds normal; no masses,  no organomegaly Skin: Skin color, texture, turgor normal. No rashes or lesions Lymph nodes: Cervical, supraclavicular, and axillary nodes normal. Neurologic: Grossly normal without any motor, sensory or deep tendon reflexes. Musculoskeletal: No joint deformity or effusion.    Assessment and Plan:   70 year old with:  1.  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  His PSA is 7.2 with testosterone of 263.  Treatment options at this time were discussed and the natural course of this disease was reviewed.  Abdomen deprivation therapy is the backbone of treating this disease and given his high testosterone level androgen deprivation therapy should be  restarted.  Additional therapy including abiraterone, enzalutamide, systemic chemotherapy were also reviewed.  After discussion today, I recommended proceeding with either Zytiga 1000 mg daily with prednisone or Xtandi 160 mg in addition to his androgen deprivation.  Complications associated with this treatment including hypertension, renal insufficiency, edema among others.  He is contemplating switching his treatment from the [place] to to get it locally and we will happy to assist in his care if he decides to do so I will prescribe this medication for him.  We are attempting to obtain all his medical records at this time which are currently not available to me.   2.  Androgen deprivation therapy: Risks and benefits of long-term treatment were discussed.  These include osteoporosis, weight gain among others were reviewed.  I recommended continuing this indefinitely.  He is scheduled to receive Eligard 45 mg every 6 months.  3.  Follow-up: Will be determined  pending his decision regarding his care.   60  minutes were dedicated to this visit. The time was spent on reviewing laboratory data, imaging studies, discussing treatment options, and answering questions regarding future plan.     A copy of this consult has been forwarded to the requesting physician.

## 2021-04-21 ENCOUNTER — Other Ambulatory Visit: Payer: Self-pay

## 2021-04-21 ENCOUNTER — Ambulatory Visit
Admission: RE | Admit: 2021-04-21 | Discharge: 2021-04-21 | Disposition: A | Discharge status: Home / Self Care | Payer: Self-pay | Source: Ambulatory Visit | Attending: Oncology | Admitting: Oncology

## 2021-04-21 DIAGNOSIS — C61 Malignant neoplasm of prostate: Secondary | ICD-10-CM

## 2021-04-22 ENCOUNTER — Inpatient Hospital Stay
Admission: RE | Admit: 2021-04-22 | Discharge: 2021-04-22 | Disposition: A | Discharge status: Home / Self Care | Payer: Self-pay | Source: Ambulatory Visit | Attending: Oncology | Admitting: Oncology

## 2021-04-22 ENCOUNTER — Other Ambulatory Visit (HOSPITAL_COMMUNITY): Payer: Self-pay | Admitting: Oncology

## 2021-04-22 ENCOUNTER — Ambulatory Visit
Admission: RE | Admit: 2021-04-22 | Discharge: 2021-04-22 | Disposition: A | Discharge status: Home / Self Care | Payer: Self-pay | Source: Ambulatory Visit | Attending: Oncology | Admitting: Oncology

## 2021-04-22 DIAGNOSIS — C801 Malignant (primary) neoplasm, unspecified: Secondary | ICD-10-CM

## 2021-04-29 ENCOUNTER — Other Ambulatory Visit: Payer: Self-pay | Admitting: Oncology

## 2021-04-29 NOTE — Progress Notes (Signed)
Medical records obtained from the [place] health system or following week reviewed.  To summarize, he is a 70 year old prostate cancer initially diagnosed in 2010 and received definitive therapy with radiation and seed implants.  He developed advanced disease with low-volume oligometastatic lymphadenopathy based on PSMA PET scan.  His PSA was up to 7.22.  He is currently under consideration for clinical trial utilizing androgen deprivation therapy in addition to androgen receptor pathway inhibitor as well as the role for radiation therapy to his oligometastatic disease.  He is consider these options and I discussed this these options with him today and feels that it is reasonable and appropriate to proceed with this trial if he is interested.  If he is not interested in clinical trial and he is interested in being treated locally, we can arrange for that to take place as well.  He will consider these options and let me know the near future.

## 2021-04-30 ENCOUNTER — Telehealth: Payer: Self-pay | Admitting: Oncology

## 2021-04-30 NOTE — Telephone Encounter (Signed)
Scheduled per sch msg. Called, was not able to leave msg. Mailed printout

## 2021-05-01 DIAGNOSIS — M5416 Radiculopathy, lumbar region: Secondary | ICD-10-CM | POA: Diagnosis not present

## 2021-05-01 DIAGNOSIS — M461 Sacroiliitis, not elsewhere classified: Secondary | ICD-10-CM | POA: Diagnosis not present

## 2021-05-01 DIAGNOSIS — M5136 Other intervertebral disc degeneration, lumbar region: Secondary | ICD-10-CM | POA: Diagnosis not present

## 2021-05-13 ENCOUNTER — Ambulatory Visit: Payer: PPO | Admitting: Oncology

## 2021-06-16 ENCOUNTER — Ambulatory Visit: Payer: PPO | Admitting: Neurology

## 2021-06-16 ENCOUNTER — Encounter: Payer: Self-pay | Admitting: Neurology

## 2021-06-16 VITALS — BP 128/79 | HR 76 | Ht 73.0 in | Wt 209.0 lb

## 2021-06-16 DIAGNOSIS — I7771 Dissection of carotid artery: Secondary | ICD-10-CM | POA: Diagnosis not present

## 2021-06-16 DIAGNOSIS — I63411 Cerebral infarction due to embolism of right middle cerebral artery: Secondary | ICD-10-CM | POA: Diagnosis not present

## 2021-06-16 NOTE — Progress Notes (Signed)
[facility] [address] [place]. [place] [address] [phone]       OFFICE CONSULT NOTE  Mr. William Hicks Date of Birth:  10/09/1950 Medical Record Number:  [id number]   Referring MD: Self  Reason for Referral: Stroke  HPI: William Hicks is a 71 year old African-American male seen today for initial office consultation visit for stroke follow-up.  History is obtained from the patient and review of electronic medical records and I also personally reviewed available imaging films in PACS.  Patient is also brought imaging disc with him for review. He has past medical history of prostate cancer, gastroesophageal reflux disease, asthma, sinusitis and kidney stones.  He presented on 09/09/2020 to [facility] for evaluation for right-sided headache that happened a few days prior described as a pressure-like sensation in the right forehead and cheek and at times got worse that it made him cry.  He did have some ongoing drainage and redness in his eyes also.  Tylenol and tramadol took the edge off but did not get rid of it.  He denies any prior focal neurological symptoms.  Initial CT scan of the head on admission showed a hypodensity in the right parietal region raising concern for a subacute stroke.  Subsequently MRI scan was obtained which confirmed right parietal MCA branch infarct.  CT angiogram showed no significant extracranial stenosis but did show irregularity in the petrous segment with a filling defect possibly clot with underlying dissection but there was good intracranial flow.  LDL cholesterol elevated 154 mg percent.  2D echo showed ejection fraction of 60 to 65% without atrial clot.  Hemoglobin A1c was 5.6.  Patient was started on dual antiplatelet therapy aspirin Plavix and referred for outpatient follow-up but for unclear reason patient has not seen me until now.  Patient likely saw a neurologist Dr. Chales [place] and [facility] [place] who ordered follow-up CT  angio which was done on 03/26/2021 and I personally reviewed the images shows improvement in the right petrous carotid filling defect with good flow now though there is slight irregularity of the terminal cervical right carotid raising question of old dissection underlying.  Patient states she is doing well.  He has had no recurrent stroke or TIA symptoms.  Is tolerating clopidogrel well without bleeding or bruising.  His blood pressure is under good control today it is 128/79.  He remains on Lipitor which is tolerating well without muscle aches and pains but has not had any follow-up lipid profile checked.  Does not smoke denies drinking heavy alcohol.  He does exercise a few days a week but knows he needs to lose weight.  [facility] on repeating CT angiogram in April 2023.  He does give history of significant close head injury many years ago and he had significant neck pain and possibly had skull base carotid dissection at that time but he had no symptoms until last year. ROS:   14 system review of systems is positive for headache, vision difficulties only all other systems negative  PMH:  Past Medical History:  Diagnosis Date   Arthritis    Asthma    Epistaxis    GERD (gastroesophageal reflux disease)    Kidney stones    Prostate cancer ([place])    Sinusitis     Social History:  Social History   Socioeconomic History   Marital status: Married    Spouse name: Not on file   Number  of children: Not on file   Years of education: Not on file   Highest education level: Not on file  Occupational History   Not on file  Tobacco Use   Smoking status: Former   Smokeless tobacco: Never  Substance and Sexual Activity   Alcohol use: Yes   Drug use: No   Sexual activity: Not on file  Other Topics Concern   Not on file  Social History Narrative   Not on file   Social Determinants of Health   Financial Resource Strain: Not on file  Food  Insecurity: Not on file  Transportation Needs: Not on file  Physical Activity: Not on file  Stress: Not on file  Social Connections: Not on file  Intimate Partner Violence: Not on file    Medications:   Current Outpatient Medications on File Prior to Visit  Medication Sig Dispense Refill   albuterol (VENTOLIN HFA) 108 (90 Base) MCG/ACT inhaler Inhale into the lungs.     Baclofen 5 MG TABS Take 1 tablet by mouth 3 (three) times daily.     Cholecalciferol 25 MCG (1000 UT) tablet Take 1 tablet by mouth daily.     COVID-19 mRNA bivalent vaccine, Pfizer, (PFIZER COVID-19 VAC BIVALENT) injection Inject into the muscle. 0.3 mL 0   COVID-19 mRNA Vac-TriS, Pfizer, (PFIZER-BIONT COVID-19 VAC-TRIS) SUSP injection Inject into the muscle. 0.3 mL 0   cromolyn (OPTICROM) 4 % ophthalmic solution 1 drop 4 (four) times daily.     fluticasone (FLONASE) 50 MCG/ACT nasal spray Place into the nose.     montelukast (SINGULAIR) 10 MG tablet Take 1 tablet (10 mg total) by mouth at bedtime. 90 tablet 2   pantoprazole (PROTONIX) 40 MG tablet TAKE ONE TABLET BY MOUTH EVERY DAY TO CONTROL STOMACH ACID     pseudoephedrine (SUDAFED) 30 MG tablet 2 tablets as needed     triamcinolone (NASACORT) 55 MCG/ACT AERO nasal inhaler Place 1 spray into the nose daily.     atorvastatin (LIPITOR) 80 MG tablet Take 1 tablet (80 mg total) by mouth daily. 30 tablet 0   [DISCONTINUED] fluticasone (VERAMYST) 27.5 MCG/SPRAY nasal spray Place 2 sprays into the nose daily.       No current facility-administered medications on file prior to visit.    Allergies:   Allergies  Allergen Reactions   Aspirin Diarrhea and Nausea And Vomiting    Physical Exam General: well developed, well nourished pleasant middle-age African-American male seated, in no evident distress Head: head normocephalic and atraumatic.   Neck: supple with no carotid or supraclavicular bruits Cardiovascular: regular rate and rhythm, no murmurs Musculoskeletal: no  deformity Skin:  no rash/petichiae Vascular:  Normal pulses all extremities  Neurologic Exam Mental Status: Awake and fully alert. Oriented to place and time. Recent and remote memory intact. Attention span, concentration and fund of knowledge appropriate. Mood and affect appropriate.  Cranial Nerves: Fundoscopic exam reveals sharp disc margins. Pupils equal, briskly reactive to light. Extraocular movements full without nystagmus. Visual fields full to confrontation. Hearing intact. Facial sensation intact. Face, tongue, palate moves normally and symmetrically.  Motor: Normal bulk and tone. Normal strength in all tested extremity muscles. Sensory.: intact to touch , pinprick , position and vibratory sensation.  Coordination: Rapid alternating movements normal in all extremities. Finger-to-nose and heel-to-shin performed accurately bilaterally. Gait and Station: Arises from chair without difficulty. Stance is normal. Gait demonstrates normal stride length and balance . Able to heel, toe and tandem walk without difficulty.  Reflexes: 1+  and symmetric. Toes downgoing.   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  He has done well in follow-up CT angiogram shows near complete recanalization.     PLAN:I had a long d/w patient about his remote stroke, right petrous carotid dissection, discussed follow-up CT angiogram results, risk for recurrent stroke/TIAs, personally independently reviewed imaging studies and stroke evaluation results and answered questions.Continue clopidogrel 75 mg daily  for secondary stroke prevention and maintain strict control of hypertension with blood pressure goal below 130/90, diabetes with hemoglobin A1c goal below 6.5% and lipids with LDL cholesterol goal below 70 mg/dL. I also advised the patient  to eat a healthy diet with plenty of whole grains, cereals, fruits and vegetables, exercise regularly and maintain ideal body weight .check screening lipid profile and hemoglobin A1c today.  Followup in the future with me in 1 year or call earlier if necessary.  Greater than 50% time during this 45-minute consultation visit was spent in counseling and coordination of care about his stroke and carotid dissection and discussion about stroke prevention and treatment and answering questions. Antony Contras MD  Note: This document was prepared with digital dictation and possible smart phrase technology. Any transcriptional errors that result from this process are unintentional.

## 2021-06-16 NOTE — Patient Instructions (Signed)
I had a long d/w patient about his remote stroke, right petrous carotid dissection, discussed follow-up CT angiogram results, risk for recurrent stroke/TIAs, personally independently reviewed imaging studies and stroke evaluation results and answered questions.Continue clopidogrel 75 mg daily  for secondary stroke prevention and maintain strict control of hypertension with blood pressure goal below 130/90, diabetes with hemoglobin A1c goal below 6.5% and lipids with LDL cholesterol goal below 70 mg/dL. I also advised the patient to eat a healthy diet with plenty of whole grains, cereals, fruits and vegetables, exercise regularly and maintain ideal body weight .check screening lipid profile and hemoglobin A1c today.  Followup in the future with me in 1 year or call earlier if necessary. Stroke Prevention Some medical conditions and behaviors can lead to a higher chance of having a stroke. You can help prevent a stroke by eating healthy, exercising, not smoking, and managing any medical conditions you have. Stroke is a leading cause of functional impairment. Primary prevention is particularly important because a majority of strokes are first-time events. Stroke changes the lives of not only those who experience a stroke but also their family and other caregivers. How can this condition affect me? A stroke is a medical emergency and should be treated right away. A stroke can lead to brain damage and can sometimes be life-threatening. If a person gets medical treatment right away, there is a better chance of surviving and recovering from a stroke. What can increase my risk? The following medical conditions may increase your risk of a stroke: Cardiovascular disease. High blood pressure (hypertension). Diabetes. High cholesterol. Sickle cell disease. Blood clotting disorders (hypercoagulable state). Obesity. Sleep disorders (obstructive sleep apnea). Other risk factors include: Being older than age  65. Having a history of blood clots, stroke, or mini-stroke (transient ischemic attack, TIA). Genetic factors, such as race, ethnicity, or a family history of stroke. Smoking cigarettes or using other tobacco products. Taking birth control pills, especially if you also use tobacco. Heavy use of alcohol or drugs, especially cocaine and methamphetamine. Physical inactivity. What actions can I take to prevent this? Manage your health conditions High cholesterol levels. Eating a healthy diet is important for preventing high cholesterol. If cholesterol cannot be managed through diet alone, you may need to take medicines. Take any prescribed medicines to control your cholesterol as told by your health care provider. Hypertension. To reduce your risk of stroke, try to keep your blood pressure below 130/80. Eating a healthy diet and exercising regularly are important for controlling blood pressure. If these steps are not enough to manage your blood pressure, you may need to take medicines. Take any prescribed medicines to control hypertension as told by your health care provider. Ask your health care provider if you should monitor your blood pressure at home. Have your blood pressure checked every year, even if your blood pressure is normal. Blood pressure increases with age and some medical conditions. Diabetes. Eating a healthy diet and exercising regularly are important parts of managing your blood sugar (glucose). If your blood sugar cannot be managed through diet and exercise, you may need to take medicines. Take any prescribed medicines to control your diabetes as told by your health care provider. Get evaluated for obstructive sleep apnea. Talk to your health care provider about getting a sleep evaluation if you snore a lot or have excessive sleepiness. Make sure that any other medical conditions you have, such as atrial fibrillation or atherosclerosis, are managed. Nutrition Follow  instructions from your health care  provider about what to eat or drink to help manage your health condition. These instructions may include: Reducing your daily calorie intake. Limiting how much salt (sodium) you use to 1,500 milligrams (mg) each day. Using only healthy fats for cooking, such as olive oil, canola oil, or sunflower oil. Eating healthy foods. You can do this by: Choosing foods that are high in fiber, such as whole grains, and fresh fruits and vegetables. Eating at least 5 servings of fruits and vegetables a day. Try to fill one-half of your plate with fruits and vegetables at each meal. Choosing lean protein foods, such as lean cuts of meat, poultry without skin, fish, tofu, beans, and nuts. Eating low-fat dairy products. Avoiding foods that are high in sodium. This can help lower blood pressure. Avoiding foods that have saturated fat, trans fat, and cholesterol. This can help prevent high cholesterol. Avoiding processed and prepared foods. Counting your daily carbohydrate intake.  Lifestyle If you drink alcohol: Limit how much you have to: 0-1 drink a day for women who are not pregnant. 0-2 drinks a day for men. Know how much alcohol is in your drink. In the [place] one drink equals one 12 oz bottle of beer (395mL), one 5 oz glass of wine (11mL), or one 1 oz glass of hard liquor (73mL). Do not use any products that contain nicotine or tobacco. These products include cigarettes, chewing tobacco, and vaping devices, such as e-cigarettes. If you need help quitting, ask your health care provider. Avoid secondhand smoke. Do not use drugs. Activity  Try to stay at a healthy weight. Get at least 30 minutes of exercise on most days, such as: Fast walking. Biking. Swimming. Medicines Take over-the-counter and prescription medicines only as told by your health care provider. Aspirin or blood thinners (antiplatelets or anticoagulants) may be recommended to reduce your risk of  forming blood clots that can lead to stroke. Avoid taking birth control pills. Talk to your health care provider about the risks of taking birth control pills if: You are over 73 years old. You smoke. You get very bad headaches. You have had a blood clot. Where to find more information American Stroke Association: www.strokeassociation.org Get help right away if: You or a loved one has any symptoms of a stroke. "BE FAST" is an easy way to remember the main warning signs of a stroke: B - Balance. Signs are dizziness, sudden trouble walking, or loss of balance. E - Eyes. Signs are trouble seeing or a sudden change in vision. F - Face. Signs are sudden weakness or numbness of the face, or the face or eyelid drooping on one side. A - Arms. Signs are weakness or numbness in an arm. This happens suddenly and usually on one side of the body. S - Speech. Signs are sudden trouble speaking, slurred speech, or trouble understanding what people say. T - Time. Time to call emergency services. Write down what time symptoms started. You or a loved one has other signs of a stroke, such as: A sudden, severe headache with no known cause. Nausea or vomiting. Seizure. These symptoms may represent a serious problem that is an emergency. Do not wait to see if the symptoms will go away. Get medical help right away. Call your local emergency services (911 in the [place] Do not drive yourself to the hospital. Summary You can help to prevent a stroke by eating healthy, exercising, not smoking, limiting alcohol intake, and managing any medical conditions you may have. Do not  use any products that contain nicotine or tobacco. These include cigarettes, chewing tobacco, and vaping devices, such as e-cigarettes. If you need help quitting, ask your health care provider. Remember "BE FAST" for warning signs of a stroke. Get help right away if you or a loved one has any of these signs. This information is not intended to  replace advice given to you by your health care provider. Make sure you discuss any questions you have with your health care provider. Document Revised: 12/10/2019 Document Reviewed: 12/10/2019 Elsevier Patient Education  [employer].

## 2021-06-17 LAB — LIPID PANEL
Chol/HDL Ratio: 2.9 ratio (ref 0.0–5.0)
Cholesterol, Total: 161 mg/dL (ref 100–199)
HDL: 55 mg/dL (ref >39)
LDL Chol Calc (NIH): 76 mg/dL (ref 0–99)
Triglycerides: 177 mg/dL — ABNORMAL HIGH (ref 0–149)
VLDL Cholesterol Cal: 30 mg/dL (ref 5–40)

## 2021-06-17 LAB — HEMOGLOBIN A1C
Est. average glucose Bld gHb Est-mCnc: 128 mg/dL
Hgb A1c MFr Bld: 6.1 % — ABNORMAL HIGH (ref 4.8–5.6)

## 2021-06-18 ENCOUNTER — Telehealth: Payer: Self-pay | Admitting: Neurology

## 2021-06-18 NOTE — Telephone Encounter (Signed)
Pt asking for results to blood work

## 2021-06-23 NOTE — Telephone Encounter (Signed)
Pt verified by name and DOB,  normal results given per provider, pt voiced understanding all question answered. °

## 2021-06-24 NOTE — Progress Notes (Signed)
Kindly inform the patient that cholesterol profile and screening test for diabetes are borderline but acceptable.  Triglycerides are elevated and to see primary care physician to discuss how to bring this down

## 2021-06-25 ENCOUNTER — Telehealth: Payer: Self-pay

## 2021-06-25 NOTE — Telephone Encounter (Signed)
I called the pt and advised of results. Pt verbalized understanding/appreciation for the call. Report has been sent to PCP.

## 2021-06-25 NOTE — Telephone Encounter (Signed)
-----   Message from Anda Latina, RN sent at 06/25/2021  7:40 AM EST -----  ----- Message ----- From: Garvin Fila, MD Sent: 06/24/2021   3:58 PM EST To: Gna-[facility] 2 Results  Kindly inform the patient that cholesterol profile and screening test for diabetes are borderline but acceptable.  Triglycerides are elevated and to see primary care physician to discuss how to bring this down

## 2021-07-07 ENCOUNTER — Telehealth: Payer: Self-pay | Admitting: Neurology

## 2021-07-07 NOTE — Telephone Encounter (Signed)
Left message for a return call

## 2021-07-07 NOTE — Telephone Encounter (Signed)
Spoke to patient. He would like to fly to [place] and wanted to make sure it was okay. Hx of [facility] in 08/2020. Dr. Krista Blue reviewed chart. Per her vo, okay to fly. Pt aware.

## 2021-07-07 NOTE — Telephone Encounter (Signed)
Pt wants to know if he is clear to fly, please call.

## 2021-07-10 ENCOUNTER — Other Ambulatory Visit: Payer: Self-pay

## 2021-07-10 ENCOUNTER — Emergency Department
Admission: EM | Admit: 2021-07-10 | Discharge: 2021-07-10 | Disposition: A | Discharge status: Home / Self Care | Payer: No Typology Code available for payment source | Attending: Emergency Medicine | Admitting: Emergency Medicine

## 2021-07-10 ENCOUNTER — Emergency Department: Payer: No Typology Code available for payment source

## 2021-07-10 DIAGNOSIS — R1030 Lower abdominal pain, unspecified: Secondary | ICD-10-CM

## 2021-07-10 DIAGNOSIS — Z8546 Personal history of malignant neoplasm of prostate: Secondary | ICD-10-CM | POA: Insufficient documentation

## 2021-07-10 DIAGNOSIS — K5900 Constipation, unspecified: Secondary | ICD-10-CM | POA: Diagnosis not present

## 2021-07-10 DIAGNOSIS — R1032 Left lower quadrant pain: Secondary | ICD-10-CM | POA: Diagnosis present

## 2021-07-10 LAB — URINALYSIS, COMPLETE (UACMP) WITH MICROSCOPIC
Bacteria, UA: NONE SEEN
Bilirubin Urine: NEGATIVE
Glucose, UA: NEGATIVE mg/dL
Ketones, ur: NEGATIVE mg/dL
Leukocytes,Ua: NEGATIVE
Nitrite: NEGATIVE
Protein, ur: NEGATIVE mg/dL
Specific Gravity, Urine: 1.02 (ref 1.005–1.030)
pH: 5 (ref 5.0–8.0)

## 2021-07-10 LAB — CBC
HCT: 39.1 % (ref 39.0–52.0)
Hemoglobin: 12.6 g/dL — ABNORMAL LOW (ref 13.0–17.0)
MCH: 24.1 pg — ABNORMAL LOW (ref 26.0–34.0)
MCHC: 32.2 g/dL (ref 30.0–36.0)
MCV: 74.8 fL — ABNORMAL LOW (ref 80.0–100.0)
Platelets: 156 10*3/uL (ref 150–400)
RBC: 5.23 MIL/uL (ref 4.22–5.81)
RDW: 14.3 % (ref 11.5–15.5)
WBC: 5.4 10*3/uL (ref 4.0–10.5)
nRBC: 0 % (ref 0.0–0.2)

## 2021-07-10 LAB — COMPREHENSIVE METABOLIC PANEL
ALT: 28 U/L (ref 0–44)
AST: 21 U/L (ref 15–41)
Albumin: 3.6 g/dL (ref 3.5–5.0)
Alkaline Phosphatase: 73 U/L (ref 38–126)
Anion gap: 3 — ABNORMAL LOW (ref 5–15)
BUN: 14 mg/dL (ref 8–23)
CO2: 32 mmol/L (ref 22–32)
Calcium: 9 mg/dL (ref 8.9–10.3)
Chloride: 107 mmol/L (ref 98–111)
Creatinine, Ser: 0.85 mg/dL (ref 0.61–1.24)
GFR, Estimated: >60 mL/min (ref >60)
Glucose, Bld: 95 mg/dL (ref 70–99)
Potassium: 3.8 mmol/L (ref 3.5–5.1)
Sodium: 142 mmol/L (ref 135–145)
Total Bilirubin: 0.9 mg/dL (ref 0.3–1.2)
Total Protein: 6.6 g/dL (ref 6.5–8.1)

## 2021-07-10 MED ORDER — ONDANSETRON 8 MG PO TBDP: 8.0000 mg | ORAL_TABLET | Freq: Three times a day (TID) | ORAL | 0 refills | Status: DC | PRN

## 2021-07-10 MED ORDER — IOHEXOL 300 MG/ML  SOLN
100.0000 mL | Freq: Once | INTRAMUSCULAR | Status: AC | PRN
  Administered 2021-07-10: 100 mL via INTRAVENOUS

## 2021-07-10 NOTE — ED Notes (Signed)
Patient transported to CT 

## 2021-07-10 NOTE — ED Triage Notes (Signed)
Pt in with co mid lower abd pain that radiates to lower back. States is having urinary frequency.

## 2021-07-10 NOTE — ED Notes (Signed)
Pt provided discharge instructions and prescription information. Pt was given the opportunity to ask questions and questions were answered. Discharge signature not obtained in the setting of the COVID-19 pandemic in order to reduce high touch surfaces.  ° °

## 2021-07-10 NOTE — ED Provider Notes (Signed)
[facility] Provider Note    Event Date/Time   First MD Initiated Contact with Patient 07/10/21 [phone]     (approximate)   History   Abdominal Pain   HPI  William Hicks is a 71 y.o. male with a history of prostate cancer and GERD who presents with lower abdominal pain over the last 2 weeks, intermittent course, bilateral and sometimes radiating down towards his groin.  The patient denies any penile or testicular pain.  He reports associated nausea but no vomiting.  He denies any diarrhea and states he has been somewhat constipated although is still having relatively regular bowel movements.  He denies fever or chills.    Physical Exam   Triage Vital Signs: ED Triage Vitals  Enc Vitals Group     BP 07/10/21 0839 102/82     Pulse Rate 07/10/21 0839 65     Resp 07/10/21 0839 20     Temp 07/10/21 0839 97.9 F (36.6 C)     Temp Source 07/10/21 0839 Oral     SpO2 07/10/21 0839 100 %     Weight 07/10/21 0840 211 lb (95.7 kg)     Height 07/10/21 0840 6\' 1"  (1.854 m)     Head Circumference --      Peak Flow --      Pain Score 07/10/21 0839 8     Pain Loc --      Pain Edu? --      Excl. in [place]? --     Most recent vital signs: Vitals:   07/10/21 0839 07/10/21 1000  BP: 102/82 121/82  Pulse: 65 (!) 50  Resp: 20   Temp: 97.9 F (36.6 C)   SpO2: 100% 100%    General: Awake, no distress.  CV:  Good peripheral perfusion.  Resp:  Normal effort.  Abd:  Soft with mild left lower quadrant and bilateral suprapubic tenderness.  No distention.  Other:  Motor intact in all extremities.  Normal coordination.  No peripheral edema.   ED Results / Procedures / Treatments   Labs (all labs ordered are listed, but only abnormal results are displayed) Labs Reviewed  CBC - Abnormal; Notable for the following components:      Result Value   Hemoglobin 12.6 (*)    MCV 74.8 (*)    MCH 24.1 (*)    All other components within normal limits  COMPREHENSIVE  METABOLIC PANEL - Abnormal; Notable for the following components:   Anion gap 3 (*)    All other components within normal limits  URINALYSIS, COMPLETE (UACMP) WITH MICROSCOPIC - Abnormal; Notable for the following components:   Color, Urine YELLOW (*)    APPearance CLEAR (*)    Hgb urine dipstick MODERATE (*)    All other components within normal limits  URINE CULTURE     EKG     RADIOLOGY  I independently viewed and interpreted the CT abdomen/pelvis which shows no evidence of obstruction or wall thickening of the intestine.  I reviewed the radiology report which indicates the following:  IMPRESSION:  1. Mild wall thickening of an incompletely distended urinary  bladder. Correlate with urinalysis to exclude cystitis.  2. Moderate volume of formed stool throughout the colon suggestive  of constipation.    PROCEDURES:  Critical Care performed: No  Procedures   MEDICATIONS ORDERED IN ED: Medications  iohexol (OMNIPAQUE) 300 MG/ML solution 100 mL (100 mLs Intravenous Contrast Given 07/10/21 1012)     IMPRESSION /  MDM / ASSESSMENT AND PLAN / ED COURSE  I reviewed the triage vital signs and the nursing notes.  71 year old male with PMH as noted above presents with intermittent lower abdominal pain over the last 2 weeks with some nausea and constipation.  On exam he is overall well-appearing with normal vital signs.  He has some mild lower abdominal tenderness.  He denies scrotal or penile pain.  Differential diagnosis includes, but is not limited to, diverticulitis, colitis, enteritis,  proctitis, ureteral stone, prostatitis, or less likely malignancy.  We will obtain lab work-up and a CT abdomen for further evaluation.  ----------------------------------------- 11:20 AM on 07/10/2021 -----------------------------------------  Lab work-up is unremarkable.  There is no leukocytosis, electrolyte or LFT abnormalities, or other concerning findings.   CT shows some wall  thickening of the bladder although the urinalysis is negative.  There is no evidence of UTI.  A urine culture has been sent.  I recommended that the patient follow-up with his urologist at the [facility] for further work-up as needed if his symptoms do not resolve.  The CT also shows evidence of constipation.  The patient already has MiraLAX at home that he has taken only occasionally.  I have instructed him to take it daily for at least the next week.  At this time, the patient is stable for discharge home.  I counseled him on the results of the work-up and plan of care.  Return precautions given, and he expresses understanding.   FINAL CLINICAL IMPRESSION(S) / ED DIAGNOSES   Final diagnoses:  Lower abdominal pain  Constipation, unspecified constipation type     Rx / DC Orders   ED Discharge Orders          Ordered    ondansetron (ZOFRAN-ODT) 8 MG disintegrating tablet  Every 8 hours PRN        07/10/21 1119             Note:  This document was prepared using Dragon voice recognition software and may include unintentional dictation errors.    Arta Silence, MD 07/10/21 1122

## 2021-07-10 NOTE — Discharge Instructions (Signed)
Your scan shows constipation as well as some possible inflammation in your bladder.  There is no sign of a bladder infection.  Take the MiraLAX that you have at home daily over at least the next week.  You may use the Zofran prescribed today as needed for nausea.  Make an appointment to follow-up with your urologist at the [facility].  Return to the ER for new, worsening, or persistent severe abdominal or pelvic pain, fever, difficulty urinating, vomiting, blood in the stool, or any other new or worsening symptoms that concern you.

## 2021-07-12 LAB — URINE CULTURE: Culture: <10000 — AB

## 2021-07-23 ENCOUNTER — Ambulatory Visit: Payer: Self-pay | Admitting: Urology

## 2021-08-03 ENCOUNTER — Telehealth: Payer: Self-pay | Admitting: Internal Medicine

## 2021-08-03 NOTE — Telephone Encounter (Signed)
Patient is calling to reschedule his NPA for tomorrow. Attempted to reschedule there was a message that the provider is no longer accepting New Patients. However, one note states that Dr. Rosana Berger is accepting New Patients.  ?Attempted to call the office, to ask. Was placed on hold. Patient was unable to hold because he is at work. ?Please advise CB- [phone] ?

## 2021-08-04 ENCOUNTER — Other Ambulatory Visit: Payer: Self-pay | Admitting: Neurology

## 2021-08-04 ENCOUNTER — Ambulatory Visit: Payer: Self-pay | Admitting: Internal Medicine

## 2021-08-04 NOTE — Telephone Encounter (Signed)
Note from 06/15/21 appt: ? ?I personally independently reviewed imaging studies and stroke evaluation results and answered questions.Continue clopidogrel 75 mg daily  for secondary stroke prevention and maintain strict control of hypertension with blood pressure goal below 130/90, diabetes with hemoglobin A1c goal below 6.5% and lipids with LDL cholesterol goal below 70 mg/dL. ?____________________________________ ? ?The patient needs a new prescription sent to [facility] until his PCP at the [place] can get the medication to him. This is to prevent disruption in his care.  ? ? ?

## 2021-08-04 NOTE — Telephone Encounter (Signed)
Pt requesting a 90-day supply for Plavix. [facility] but will be a week or two weeks before will be delivered. Can Dr. Leonie Man send refill to [facility][id number] ?

## 2021-08-05 MED ORDER — CLOPIDOGREL BISULFATE 75 MG PO TABS: 75.0000 mg | ORAL_TABLET | Freq: Every day | ORAL | 0 refills | Status: DC

## 2021-09-11 ENCOUNTER — Other Ambulatory Visit: Payer: Self-pay | Admitting: Physical Medicine and Rehabilitation

## 2021-09-11 DIAGNOSIS — M5126 Other intervertebral disc displacement, lumbar region: Secondary | ICD-10-CM

## 2021-09-11 DIAGNOSIS — M5416 Radiculopathy, lumbar region: Secondary | ICD-10-CM

## 2021-09-27 ENCOUNTER — Other Ambulatory Visit: Payer: PPO

## 2021-10-07 ENCOUNTER — Ambulatory Visit (INDEPENDENT_AMBULATORY_CARE_PROVIDER_SITE_OTHER): Payer: PPO | Admitting: Internal Medicine

## 2021-10-07 ENCOUNTER — Encounter: Payer: Self-pay | Admitting: Internal Medicine

## 2021-10-07 VITALS — BP 116/70 | HR 86 | Temp 98.3°F | Resp 16 | Ht 73.0 in | Wt 211.0 lb

## 2021-10-07 DIAGNOSIS — R7303 Prediabetes: Secondary | ICD-10-CM | POA: Diagnosis not present

## 2021-10-07 DIAGNOSIS — K219 Gastro-esophageal reflux disease without esophagitis: Secondary | ICD-10-CM

## 2021-10-07 DIAGNOSIS — Z7901 Long term (current) use of anticoagulants: Secondary | ICD-10-CM

## 2021-10-07 DIAGNOSIS — E782 Mixed hyperlipidemia: Secondary | ICD-10-CM | POA: Diagnosis not present

## 2021-10-07 DIAGNOSIS — Z8546 Personal history of malignant neoplasm of prostate: Secondary | ICD-10-CM | POA: Insufficient documentation

## 2021-10-07 DIAGNOSIS — J4520 Mild intermittent asthma, uncomplicated: Secondary | ICD-10-CM

## 2021-10-07 DIAGNOSIS — C61 Malignant neoplasm of prostate: Secondary | ICD-10-CM

## 2021-10-07 DIAGNOSIS — Z8673 Personal history of transient ischemic attack (TIA), and cerebral infarction without residual deficits: Secondary | ICD-10-CM

## 2021-10-07 DIAGNOSIS — Z9109 Other allergy status, other than to drugs and biological substances: Secondary | ICD-10-CM

## 2021-10-07 LAB — POCT GLYCOSYLATED HEMOGLOBIN (HGB A1C): Hemoglobin A1C: 6.1 % — AB (ref 4.0–5.6)

## 2021-10-07 MED ORDER — ATORVASTATIN CALCIUM 40 MG PO TABS: 80.0000 mg | ORAL_TABLET | Freq: Every evening | ORAL | 1 refills | Status: DC

## 2021-10-07 NOTE — Assessment & Plan Note (Signed)
Following with Oncology, note reviewed from 04/29/21. Currently on Leuprolide injections every 6 months.  ?

## 2021-10-07 NOTE — Assessment & Plan Note (Signed)
Lipid panel good from 1/23. Refill Lipitor 80 mg daily.  ?

## 2021-10-07 NOTE — Assessment & Plan Note (Signed)
Without deficits, following with Neurology through the [place]. On Lipitor 80 mg and Plavix 75 mg.  ?

## 2021-10-07 NOTE — Assessment & Plan Note (Signed)
Continue Protonix 40 mg daily, having some exacerbations of symptoms because he is eating more chocolate, which can definitely increase symptoms. Recommend continuing medication daily and cutting back on chocolate and rising bed.  ?

## 2021-10-07 NOTE — Progress Notes (Signed)
? ?New Patient Office Visit ? ?Subjective   ? ?Patient ID: William Hicks, male    DOB: Oct 18, 1950  Age: 71 y.o. MRN: [id number] ? ?CC:  ?Chief Complaint  ?Patient presents with  ? Establish Care  ? Medication Refill  ?  Chol med  ? ? ?HPI ?William Hicks presents to establish care. ? 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 No abnormal bleeding, no hematuria or blood in his stools  ?-Patient is compliant with above medications and reports no side effects.  ?-Last lipid panel: Lipid Panel  ?   ?Component Value Date/Time  ? CHOL 161 06/16/2021 1129  ? TRIG 177 (H) 06/16/2021 1129  ? HDL 55 06/16/2021 1129  ? CHOLHDL 2.9 06/16/2021 1129  ? CHOLHDL 3.3 09/10/2020 0429  ? VLDL 24 09/10/2020 0429  ? Horace 76 06/16/2021 1129  ? LABVLDL 30 06/16/2021 1129  ? ?Prostate Carcinoma: ?-Following with Oncology, note reviewed from 04/29/21  ?-First diagnosed in 2010, received treatment with radiation and seed implants  ?-Currently on Leuprolide injection every 6 months, will see the Oncologist through the [place], next appointment in June  ? ?Asthma:  ?-Asthma status: controlled ?-Current Treatments: Albuterol PRN ?-Satisfied with current treatment?: yes ?-Albuterol/rescue inhaler frequency: haven't had to use in the last 2 weeks, worse with allergies  ?-Dyspnea frequency: no ?-Wheezing frequency: no ?-Cough frequency: no ?-Nocturnal symptom frequency: no ?-Visits to ER or Urgent Care in past year: no ?-Pneumovax: Up to Date ?-Influenza: Up to Date ? ?Pre-Diabetes: ?-A1c 6.1 in 1/23  ? ?GERD: ?-Currently on Protonix 40 mg daily, having some break through symptoms but he is eating more chocolate lately. Denies vomiting, does have some nausea. Appetite good, weight stable.  ? ?Arthritis: ?-Would take Tramadol as needed but mainly on Tylenol.  ? ?Allergies: ?-Currently using Flonase, Zyrtec   ?-Seeing ENT, planning on starting allergy shots soon ? ?Health Maintenance: ?-Blood work UTD, hgb 12.6 on labs in 2/23 ?-Colon cancer screening: last colonoscopy 5 years ago at the [place], will obtain records. Will talk to Neurologist about pausing Plavix for repeat colonoscopy  ? ? ?Outpatient Encounter Medications as of 10/07/2021  ?Medication Sig  ? albuterol (VENTOLIN HFA) 108 (90 Base) MCG/ACT inhaler Inhale into the lungs.  ? atorvastatin (LIPITOR) 40 MG tablet Take 2 tablets by mouth at bedtime.  ? Baclofen 5 MG TABS Take 1 tablet by mouth 3 (three) times daily.  ? Cholecalciferol 25 MCG (1000 UT) tablet Take 1 tablet by mouth daily.  ? clopidogrel (PLAVIX) 75 MG tablet Take 1 tablet (75 mg total) by mouth daily.  ? cyanocobalamin 1000 MCG tablet Take 1 tablet by mouth daily.  ? diazepam (VALIUM) 5 MG tablet as needed.  ? fluticasone (FLONASE) 50 MCG/ACT nasal spray Place into the nose.  ? leuprolide, 6 Month, (LEUPROLIDE ACETATE, 6 MONTH,) 45 MG injection Inject into the skin.  ? pantoprazole (PROTONIX) 40 MG tablet TAKE ONE TABLET BY MOUTH EVERY DAY TO CONTROL STOMACH ACID  ? polyethylene glycol powder (GLYCOLAX/MIRALAX) 17 GM/SCOOP powder MIX 17GM (1 CAPFUL) BY MOUTH EVERY DAY AS NEEDED MIX 17 GRAMS (USE BOTTLE CAP) IN LIQUID OF CHOICE AS DIRECTED  ? traMADol (ULTRAM) 50 MG tablet Take 50 mg by mouth 2 (two) times daily as needed.  ? [DISCONTINUED] atorvastatin (LIPITOR) 80 MG tablet Take 1 tablet (80 mg total) by mouth daily.  ? [  DISCONTINUED] COVID-19 mRNA bivalent vaccine, Pfizer, (PFIZER COVID-19 VAC BIVALENT) injection Inject into the muscle.  ? [DISCONTINUED] COVID-19 mRNA Vac-TriS, Pfizer, (PFIZER-BIONT COVID-19 VAC-TRIS) SUSP injection Inject into the muscle.  ? [DISCONTINUED] cromolyn (OPTICROM) 4 % ophthalmic solution 1 drop 4 (four) times daily.  ? [DISCONTINUED] fluticasone (VERAMYST) 27.5 MCG/SPRAY nasal spray Place 2 sprays into the nose daily.    ? [DISCONTINUED] montelukast (SINGULAIR) 10 MG  tablet Take 1 tablet (10 mg total) by mouth at bedtime.  ? [DISCONTINUED] ondansetron (ZOFRAN-ODT) 8 MG disintegrating tablet Take 1 tablet (8 mg total) by mouth every 8 (eight) hours as needed for nausea or vomiting.  ? [DISCONTINUED] pseudoephedrine (SUDAFED) 30 MG tablet 2 tablets as needed  ? [DISCONTINUED] triamcinolone (NASACORT) 55 MCG/ACT AERO nasal inhaler Place 1 spray into the nose daily.  ? ?No facility-administered encounter medications on file as of 10/07/2021.  ? ? ?Past Medical History:  ?Diagnosis Date  ? Arthritis   ? Asthma   ? Epistaxis   ? GERD (gastroesophageal reflux disease)   ? Kidney stones   ? Prostate cancer ([place])   ? Sinusitis   ? ? ?Past Surgical History:  ?Procedure Laterality Date  ? LITHOTRIPSY  2008  ? ? ?Family History  ?Problem Relation Age of Onset  ? Heart failure Mother   ? Alzheimer's disease Father   ? ? ?Social History  ? ?Socioeconomic History  ? Marital status: Married  ?  Spouse name: Not on file  ? Number of children: Not on file  ? Years of education: Not on file  ? Highest education level: Not on file  ?Occupational History  ? Not on file  ?Tobacco Use  ? Smoking status: Former  ? Smokeless tobacco: Never  ?Vaping Use  ? Vaping Use: Never used  ?Substance and Sexual Activity  ? Alcohol use: Yes  ?  Comment: 2-3 week beer  ? Drug use: No  ? Sexual activity: Not Currently  ?Other Topics Concern  ? Not on file  ?Social History Narrative  ? Not on file  ? ?Social Determinants of Health  ? ?Financial Resource Strain: Not on file  ?Food Insecurity: Not on file  ?Transportation Needs: Not on file  ?Physical Activity: Not on file  ?Stress: Not on file  ?Social Connections: Not on file  ?Intimate Partner Violence: Not on file  ? ? ?Review of Systems  ?Constitutional:  Negative for chills, fever and weight loss.  ?Respiratory:  Negative for cough, shortness of breath and wheezing.   ?Cardiovascular:  Negative for chest pain.  ?Gastrointestinal:  Positive for heartburn and nausea.  Negative for abdominal pain, blood in stool and vomiting.  ?Genitourinary:  Negative for hematuria.  ?Neurological:  Negative for weakness.  ? ?  ? ? ?Objective   ? ?BP 116/70   Pulse 86   Temp 98.3 ?F (36.8 ?C)   Resp 16   Ht '6\' 1"'$  (1.854 m)   Wt 211 lb (95.7 kg)   SpO2 97%   BMI 27.84 kg/m?  ? ?Physical Exam ?Constitutional:   ?   Appearance: Normal appearance.  ?HENT:  ?   Head: Normocephalic and atraumatic.  ?   Mouth/Throat:  ?   Mouth: Mucous membranes are moist.  ?   Pharynx: Oropharynx is clear.  ?Eyes:  ?   Conjunctiva/sclera: Conjunctivae normal.  ?Cardiovascular:  ?   Rate and Rhythm: Normal rate and regular rhythm.  ?Pulmonary:  ?   Effort: Pulmonary effort is normal.  ?  Breath sounds: Normal breath sounds.  ?Abdominal:  ?   General: There is no distension.  ?   Palpations: Abdomen is soft.  ?   Tenderness: There is no abdominal tenderness. There is no guarding.  ?Musculoskeletal:  ?   Right lower leg: No edema.  ?   Left lower leg: No edema.  ?Skin: ?   General: Skin is warm and dry.  ?Neurological:  ?   General: No focal deficit present.  ?   Mental Status: He is alert. Mental status is at baseline.  ?Psychiatric:     ?   Mood and Affect: Mood normal.     ?   Behavior: Behavior normal.  ? ? ? ?  ? ?Assessment & Plan:  ? ?Problem List Items Addressed This Visit   ? ?  ? Respiratory  ? Mild intermittent asthma without complication  ?  Controlled, doing well with Albuterol as needed.  ? ?  ?  ?  ? Digestive  ? GERD (gastroesophageal reflux disease)  ?  Continue Protonix 40 mg daily, having some exacerbations of symptoms because he is eating more chocolate, which can definitely increase symptoms. Recommend continuing medication daily and cutting back on chocolate and rising bed.  ? ?  ?  ? Relevant Medications  ? polyethylene glycol powder (GLYCOLAX/MIRALAX) 17 GM/SCOOP powder  ?  ? Genitourinary  ? Malignant neoplasm of prostate ([place])  ?  Following with Oncology, note reviewed from 04/29/21.  Currently on Leuprolide injections every 6 months.  ? ?  ?  ? Relevant Medications  ? leuprolide, 6 Month, (LEUPROLIDE ACETATE, 6 MONTH,) 45 MG injection  ?   ?  ? Other  ? HLD (hyperlipidemia) - Primary  ?  Lipid p

## 2021-10-07 NOTE — Assessment & Plan Note (Signed)
A1c here stable at 6.1%. Recommend decreasing sugars and carbs in the diet, plan to recheck at follow up.  ?

## 2021-10-07 NOTE — Patient Instructions (Signed)
It was great seeing you today! ? ?Plan discussed at today's visit: ?-A1c check today ?-Lipitor refilled ?-Try to cut back on chocolate and continue Protonix for acid reflux ? ?Follow up in: 6 months  ? ?Take care and let us know if you have any questions or concerns prior to your next visit. ? ?Dr. Rosana Berger ? ?

## 2021-10-07 NOTE — Assessment & Plan Note (Signed)
Controlled, doing well with Albuterol as needed.  ?

## 2021-10-07 NOTE — Assessment & Plan Note (Signed)
On Plavix per Neurology, tolerating well.  ?

## 2021-10-11 IMAGING — US US ABDOMEN COMPLETE
1 series · 14 of 25 positions shown · non-contrast
Comparison: None.

CLINICAL DATA: Nausea and abdominal pain for months.

EXAM:
ABDOMEN ULTRASOUND COMPLETE

[Series 1: us abdomen complete · 0.20mm/px · 14 of 92 slices shown]
[im 1/92]
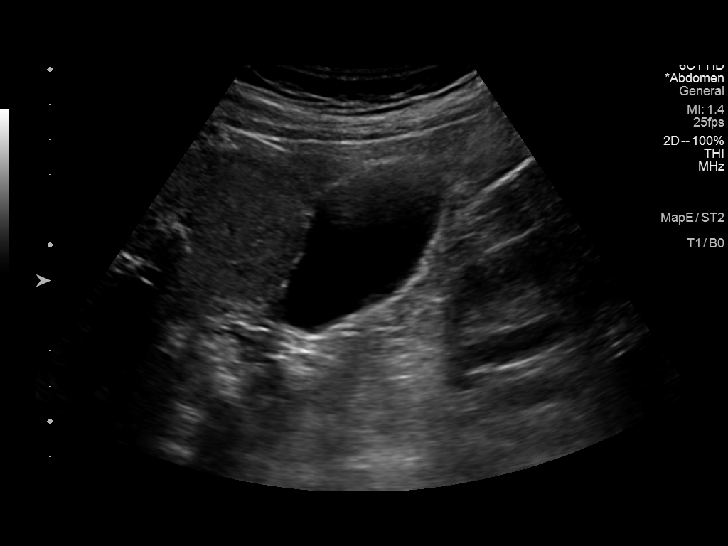
[im 8/92]
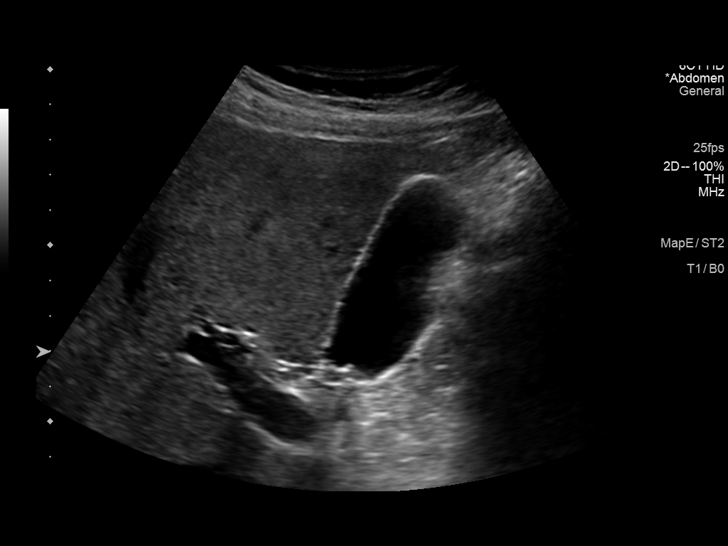
[im 16/92]
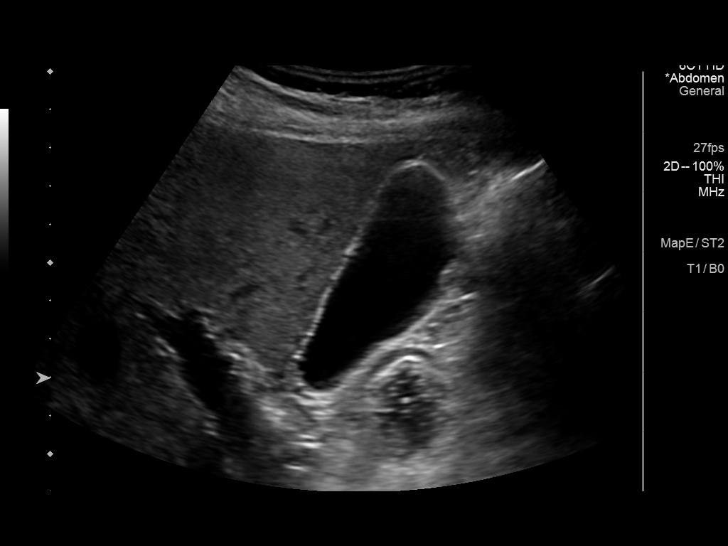
[im 23/92]
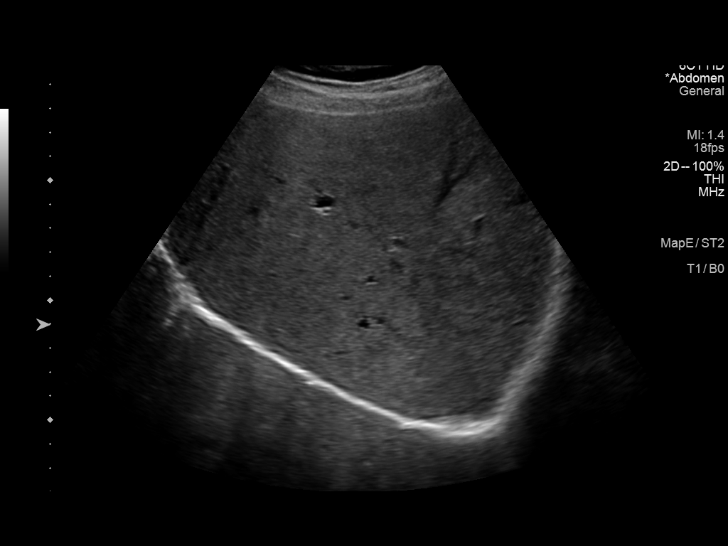
[im 31/92]
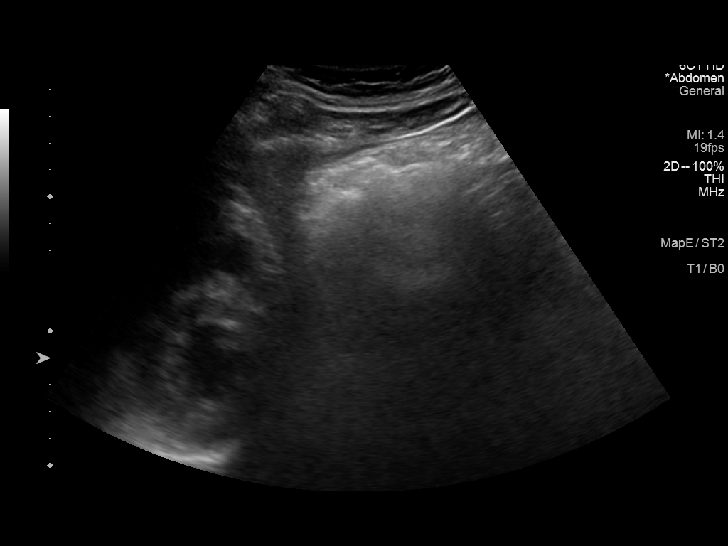
[im 35/92]
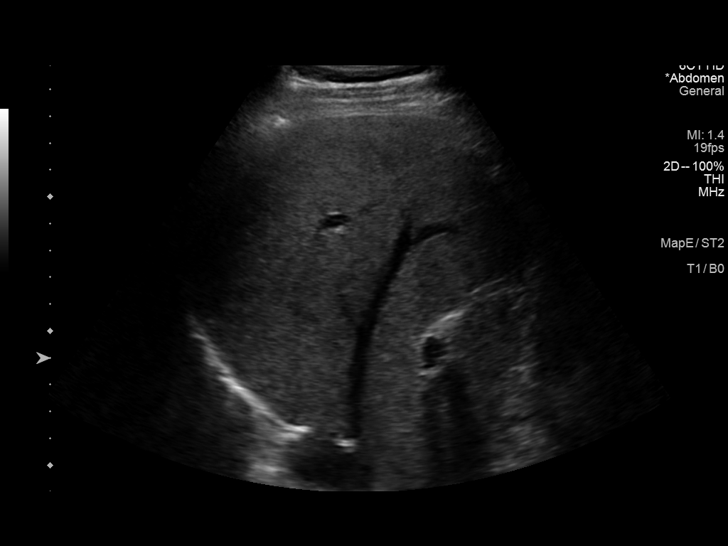
[im 42/92]
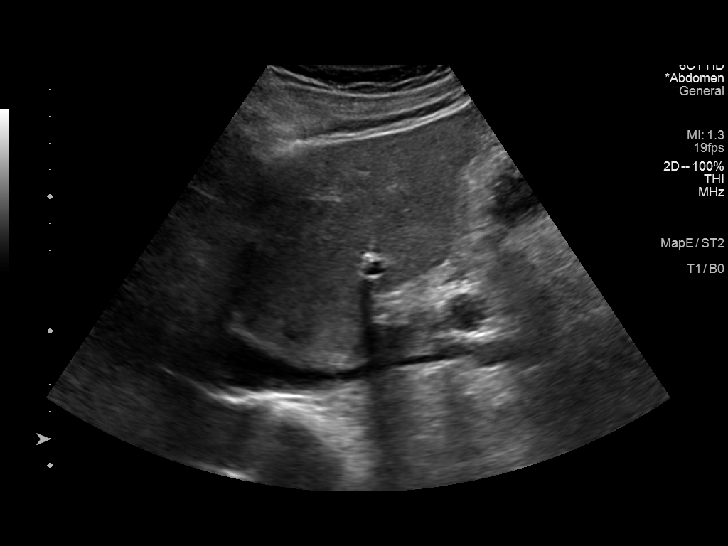
[im 50/92]
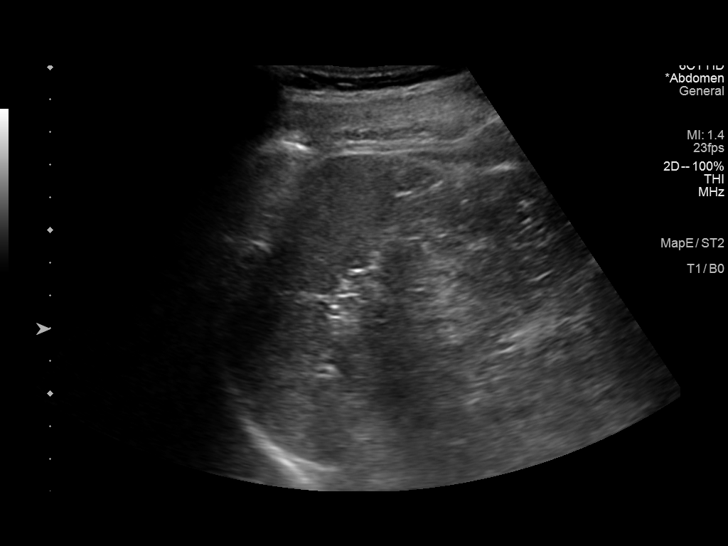
[im 57/92]
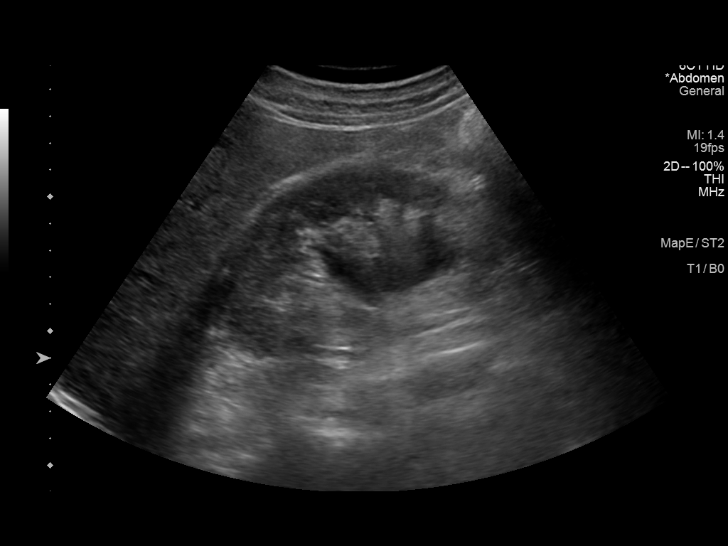
[im 61/92]
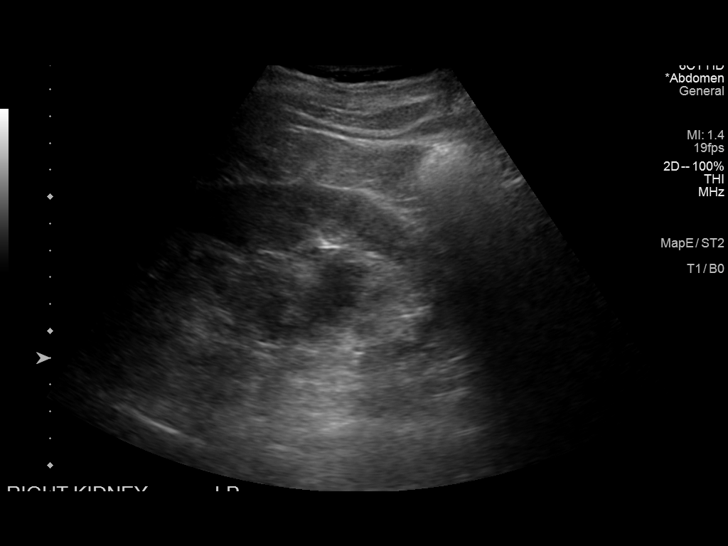
[im 69/92]
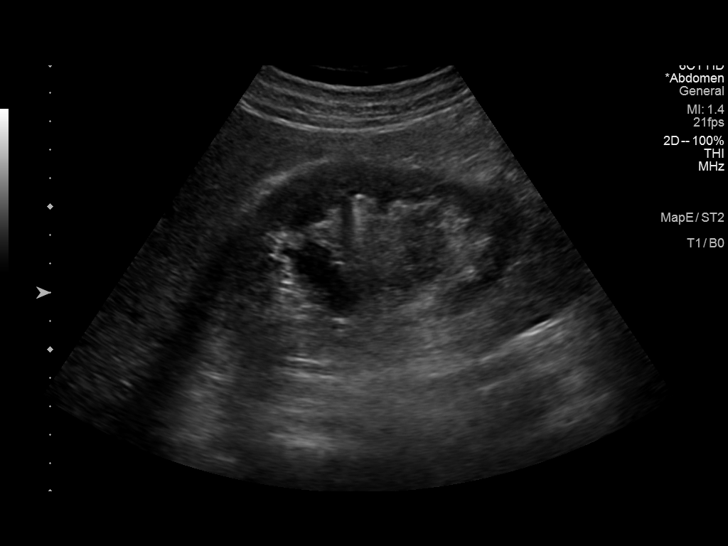
[im 76/92]
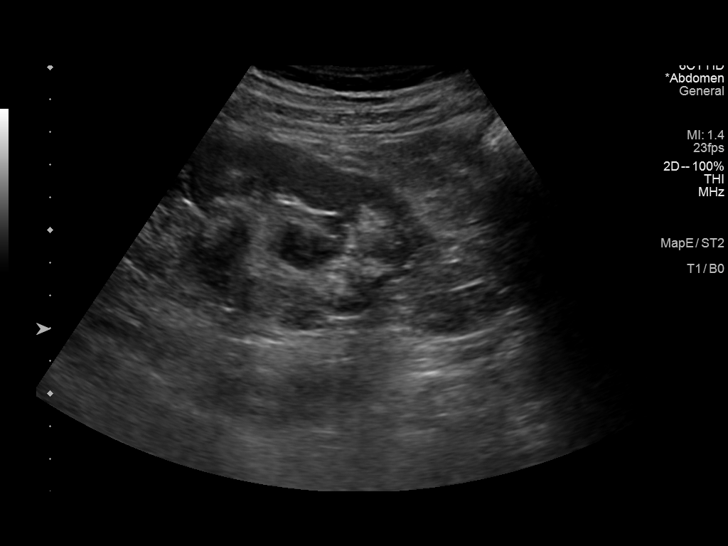
[im 84/92]
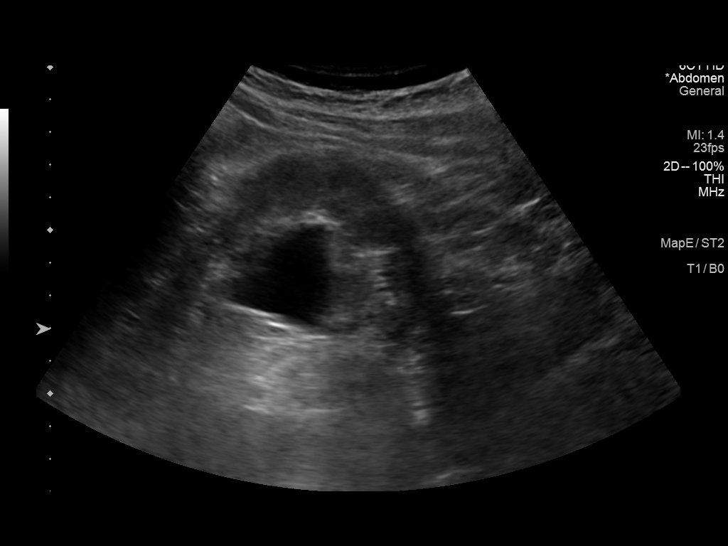
[im 92/92]
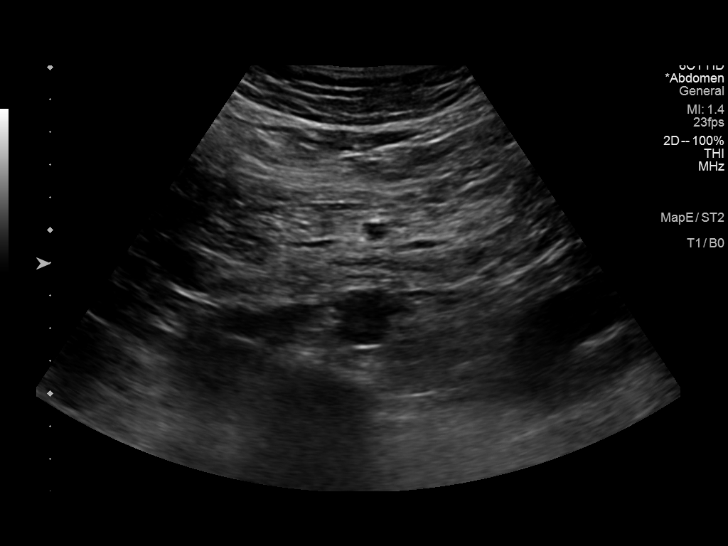

[14 of 25 positions shown; findings below may reference images not displayed]

FINDINGS: Gallbladder: No gallstones or wall thickening visualized. No
sonographic Murphy sign noted by sonographer.

Common bile duct: Diameter: 0.3 cm, within normal limits

Liver: No focal lesion identified. The liver parenchymal
echogenicity is diffusely increased. Portal vein is patent on color
Doppler imaging with normal direction of blood flow towards the
liver.

IVC: No abnormality visualized.

Pancreas: Visualized portion unremarkable.

Spleen: Size and appearance within normal limits.

Right Kidney: Length: 12.7 cm. Echogenicity within normal limits.
There are multiple renal cysts, largest measuring 6.1 cm.

Left Kidney: Length: 13.9 cm. Echogenicity within normal limits.
There are multiple cysts largest measuring 3.4 cm.

Abdominal aorta: The proximal aorta measures 2.6 cm in diameter,
similar to prior.

Other findings: None.
IMPRESSION: 1. Diffusely increased liver parenchymal echogenicity as can be seen
with hepatic steatosis.

2. Multiple bilateral renal cysts, largest on the right measuring
6.1 cm.

## 2022-01-01 ENCOUNTER — Telehealth: Payer: Self-pay | Admitting: Neurology

## 2022-01-01 NOTE — Telephone Encounter (Signed)
Pt said having a colonscopy on August 24,2023 at the [facility] [place] wanting to know when should stop clopidogrel (PLAVIX) 75 MG tablet. Would like a call from the nurse.

## 2022-01-05 ENCOUNTER — Encounter: Payer: Self-pay | Admitting: Allergy & Immunology

## 2022-01-05 ENCOUNTER — Ambulatory Visit (INDEPENDENT_AMBULATORY_CARE_PROVIDER_SITE_OTHER): Payer: No Typology Code available for payment source | Admitting: Allergy & Immunology

## 2022-01-05 VITALS — BP 130/74 | HR 80 | Temp 98.1°F | Resp 18 | Ht 73.0 in | Wt 212.5 lb

## 2022-01-05 DIAGNOSIS — J4520 Mild intermittent asthma, uncomplicated: Secondary | ICD-10-CM | POA: Diagnosis not present

## 2022-01-05 DIAGNOSIS — J3089 Other allergic rhinitis: Secondary | ICD-10-CM | POA: Diagnosis not present

## 2022-01-05 DIAGNOSIS — J302 Other seasonal allergic rhinitis: Secondary | ICD-10-CM

## 2022-01-05 NOTE — Progress Notes (Signed)
NEW PATIENT  Date of Service/Encounter:  01/05/22  Consult requested by: Teodora Medici, DO   Assessment:    Mild intermittent asthma, uncomplicated - need spirometry at the next visit   Seasonal and perennial allergic rhinitis - previously on immunotherapy   Recent stroke  Plan/Recommendations:   1. Seasonal and perennial allergic rhinitis - Testing today showed: grasses and dust mites - Copy of test results provided.  - Avoidance measures provided. - Stop taking:  - Continue with: Flonase (fluticasone) one spray per nostril daily (AIM FOR EAR ON EACH SIDE) - Start taking: Astelin (azelastine) 2 sprays per nostril 1-2 times daily as needed - You can use an extra dose of the antihistamine, if needed, for breakthrough symptoms.  - Consider nasal saline rinses 1-2 times daily to remove allergens from the nasal cavities as well as help with mucous clearance (this is especially helpful to do before the nasal sprays are given) - Consider allergy shots as a means of long-term control. - Allergy shots "re-train" and "reset" the immune system to ignore environmental allergens and decrease the resulting immune response to those allergens (sneezing, itchy watery eyes, runny nose, nasal congestion, etc).    - Allergy shots improve symptoms in 75-85% of patients.  - Make an appointment to start shots in 2-3 weeks.  2. Return in about 3 months (around 04/07/2022).    This note in its entirety was forwarded to the Provider who requested this consultation.  Subjective:   William Hicks is a 71 y.o. male presenting today for evaluation of  Chief Complaint  Patient presents with   [place] has a history of the following: Patient Active Problem List   Diagnosis Date Noted   History of malignant neoplasm of prostate 10/07/2021   History of CVA (cerebrovascular accident) without residual deficits 10/07/2021   Mild intermittent asthma without  complication 18/56/3149   Prediabetes 10/07/2021   Environmental allergies 10/07/2021   Anticoagulated by anticoagulation treatment 09/16/2020   CVA (cerebral vascular accident) ([place]) 09/09/2020   Cluster headache 09/09/2020   Acute CVA (cerebrovascular accident) ([place]) 09/09/2020   Malignant neoplasm of prostate ([place]) 05/06/2015   Degenerative arthritis of hip 11/14/2013   HLD (hyperlipidemia) 09/25/2012   GERD (gastroesophageal reflux disease) 08/17/2011   Abscess, gluteal, right 12/22/2010   Abscess, gluteal 12/18/2010   HEARTBURN 10/30/2008   PERSONAL HISTORY OF COLONIC POLYPS 10/30/2008    History obtained from: chart review and patient.  William Hicks was referred by Teodora Medici, DO.     William Hicks is a 71 y.o. male presenting for an evaluation of chronic rhinitis .  Previously followed with [place].  His last visit was in October 2018.  At that time, I started him on Mucinex as well as a prednisone Dosepak for sinusitis.  His lung testing was low t at that visit.  Since the last visit, he has been followed by ENT. He did have surgery at one point.   He was on the shots, but he never felt that the allergy shots helped at all. He got to the first Red Vial and then ended up stopping his shots completely. He has not had a sinus infection in quite some time. He had a sinus CT done in June 2023 that was largely normal. His last ENT visits was with Dr. Hassell Done. Reading below:   IMPRESSION:  Very similar appearance to the study of August 2021. Previous  functional endoscopic sinus surgery. Widespread  mild mucosal  thickening as above. No complete major sinus opacification. Wide  airspace communication at the sites of surgery.   He has year round symptoms. It stays the same all year long. He has been on cetirizine and fluticasone without complete relief.  He does think that his previous allergy shots helped. It is not really clear why he stopped taking them.    Asthma/Respiratory Symptom History: He does have albuterol to use as needed. He does not need it all too often. William Hicks's asthma has been well controlled. He has not required rescue medication, experienced nocturnal awakenings due to lower respiratory symptoms, nor have activities of daily living been limited. He has required no Emergency Department or Urgent Care visits for his asthma. He has required zero courses of systemic steroids for asthma exacerbations since the last visit. ACT score today is 25, indicating excellent asthma symptom control.   Of note, he did have a stroke last year. He is now on blood thinners.   Otherwise, there is no history of other atopic diseases, including food allergies, drug allergies, stinging insect allergies, eczema, urticaria, or contact dermatitis. There is no significant infectious history. Vaccinations are up to date.    Past Medical History: Patient Active Problem List   Diagnosis Date Noted   History of malignant neoplasm of prostate 10/07/2021   History of CVA (cerebrovascular accident) without residual deficits 10/07/2021   Mild intermittent asthma without complication 16/02/9603   Prediabetes 10/07/2021   Environmental allergies 10/07/2021   Anticoagulated by anticoagulation treatment 09/16/2020   CVA (cerebral vascular accident) ([place]) 09/09/2020   Cluster headache 09/09/2020   Acute CVA (cerebrovascular accident) ([place]) 09/09/2020   Malignant neoplasm of prostate ([place]) 05/06/2015   Degenerative arthritis of hip 11/14/2013   HLD (hyperlipidemia) 09/25/2012   GERD (gastroesophageal reflux disease) 08/17/2011   Abscess, gluteal, right 12/22/2010   Abscess, gluteal 12/18/2010   HEARTBURN 10/30/2008   PERSONAL HISTORY OF COLONIC POLYPS 10/30/2008    Medication List:  Allergies as of 01/05/2022       Reactions   Pregabalin Other (See Comments), Rash   Aspirin Diarrhea, Nausea And Vomiting        Medication List        Accurate as of  January 05, 2022  2:59 PM. If you have any questions, ask your nurse or doctor.          albuterol 108 (90 Base) MCG/ACT inhaler Commonly known as: VENTOLIN HFA Inhale into the lungs.   atorvastatin 40 MG tablet Commonly known as: LIPITOR Take 2 tablets (80 mg total) by mouth at bedtime.   Baclofen 5 MG Tabs Take 1 tablet by mouth 3 (three) times daily.   Cholecalciferol 25 MCG (1000 UT) tablet Take 1 tablet by mouth daily.   clopidogrel 75 MG tablet Commonly known as: PLAVIX Take 1 tablet (75 mg total) by mouth daily.   cyanocobalamin 1000 MCG tablet Take 1 tablet by mouth daily.   diazepam 5 MG tablet Commonly known as: VALIUM as needed.   fluticasone 50 MCG/ACT nasal spray Commonly known as: FLONASE Place into the nose.   leuprolide acetate (6 Month) 45 MG injection Generic drug: leuprolide (6 Month) Inject into the skin.   pantoprazole 40 MG tablet Commonly known as: PROTONIX TAKE ONE TABLET BY MOUTH EVERY DAY TO CONTROL STOMACH ACID   polyethylene glycol powder 17 GM/SCOOP powder Commonly known as: GLYCOLAX/MIRALAX MIX 17GM (1 CAPFUL) BY MOUTH EVERY DAY AS NEEDED MIX 17 GRAMS (USE BOTTLE CAP) IN  LIQUID OF CHOICE AS DIRECTED   traMADol 50 MG tablet Commonly known as: ULTRAM Take 50 mg by mouth 2 (two) times daily as needed.        Birth History: non-contributory  Developmental History: non-contributory  Past Surgical History: Past Surgical History:  Procedure Laterality Date   LITHOTRIPSY  2008     Family History: Family History  Problem Relation Age of Onset   Heart failure Mother    Alzheimer's disease Father      Social History: Harlee lives at home with his wife.  He lives in a house that is 70 years old.  There is wood in the main living areas as well as the bedroom.  They have gas heating and central cooling.  There are dust mite covers and the pillows, but not the bed.  There is no tobacco exposure.  There is no fume, chemical, or  dust exposure.  They do not use a HEPA filter in the home.  They do not live near an interstate or industrial area. They have a few children who do live close by.    Review of Systems  Constitutional: Negative.  Negative for fever, malaise/fatigue and weight loss.  HENT: Negative.  Negative for congestion, ear discharge and ear pain.   Eyes:  Negative for pain, discharge and redness.  Respiratory:  Negative for cough, sputum production, shortness of breath and wheezing.   Cardiovascular: Negative.  Negative for chest pain and palpitations.  Gastrointestinal:  Negative for abdominal pain, heartburn, nausea and vomiting.  Skin: Negative.  Negative for itching and rash.  Neurological:  Negative for dizziness and headaches.  Endo/Heme/Allergies:  Negative for environmental allergies. Does not bruise/bleed easily.       Objective:   Blood pressure 130/74, pulse 80, temperature 98.1 F (36.7 C), resp. rate 18, height '6\' 1"'$  (1.854 m), weight 212 lb 8 oz (96.4 kg), SpO2 98 %. Body mass index is 28.04 kg/m.     Physical Exam Vitals reviewed.  Constitutional:      Appearance: He is well-developed.  HENT:     Head: Normocephalic and atraumatic.     Right Ear: Tympanic membrane, ear canal and external ear normal. No drainage, swelling or tenderness. Tympanic membrane is not injected, scarred, erythematous, retracted or bulging.     Left Ear: Tympanic membrane, ear canal and external ear normal. No drainage, swelling or tenderness. Tympanic membrane is not injected, scarred, erythematous, retracted or bulging.     Nose: No nasal deformity, septal deviation, mucosal edema or rhinorrhea.     Right Turbinates: Enlarged, swollen and pale.     Left Turbinates: Enlarged, swollen and pale.     Right Sinus: No maxillary sinus tenderness or frontal sinus tenderness.     Left Sinus: No maxillary sinus tenderness or frontal sinus tenderness.     Mouth/Throat:     Mouth: Mucous membranes are not pale  and not dry.     Pharynx: Uvula midline.  Eyes:     General:        Right eye: No discharge.        Left eye: No discharge.     Conjunctiva/sclera: Conjunctivae normal.     Right eye: Right conjunctiva is not injected. No chemosis.    Left eye: Left conjunctiva is not injected. No chemosis.    Pupils: Pupils are equal, round, and reactive to light.  Cardiovascular:     Rate and Rhythm: Normal rate and regular rhythm.  Heart sounds: Normal heart sounds.  Pulmonary:     Effort: Pulmonary effort is normal. No tachypnea, accessory muscle usage or respiratory distress.     Breath sounds: Normal breath sounds. No wheezing, rhonchi or rales.  Chest:     Chest wall: No tenderness.  Abdominal:     Tenderness: There is no abdominal tenderness. There is no guarding or rebound.  Lymphadenopathy:     Head:     Right side of head: No submandibular, tonsillar or occipital adenopathy.     Left side of head: No submandibular, tonsillar or occipital adenopathy.     Cervical: No cervical adenopathy.  Skin:    Coloration: Skin is not pale.     Findings: No abrasion, erythema, petechiae or rash. Rash is not papular, urticarial or vesicular.  Neurological:     Mental Status: He is alert.      Diagnostic studies:    Allergy Studies:     Airborne Adult Perc - 01/05/22 1358     Time Antigen Placed 1403    Allergen Manufacturer Lavella Hammock    Location Back    Number of Test 59    1. Control-Buffer 50% Glycerol Negative    2. Control-Histamine 1 mg/ml 2+    3. Albumin saline Negative    4. [place] 3+    5. [place] 3+    6. Johnson Negative    7. [place] Blue Negative    8. Meadow Fescue 3+    9. Perennial Rye Negative    10. Sweet Vernal Negative    11. Timothy Negative    12. Cocklebur Negative    13. Burweed Marshelder Negative    14. Ragweed, short Negative    15. Ragweed, Giant Negative    16. Plantain,  English Negative    17. Lamb's Quarters Negative    18. Sheep Sorrell Negative     19. Rough Pigweed Negative    20. Marsh Elder, Rough Negative    21. Mugwort, Common Negative    22. Ash mix Negative    23. Birch mix Negative    24. Beech American Negative    25. Box, Elder Negative    26. Cedar, red Negative    27. Cottonwood, [place] Negative    28. Elm mix Negative    29. Hickory Negative    30. Maple mix Negative    31. Oak, [place] mix Negative    32. Pecan Pollen Negative    33. Pine mix Negative    34. Sycamore Eastern Negative    35. [place], Black Pollen Negative    36. Alternaria alternata Negative    37. Cladosporium Herbarum Negative    38. Aspergillus mix Negative    39. Penicillium mix Negative    40. Bipolaris sorokiniana (Helminthosporium) Negative    41. Drechslera spicifera (Curvularia) Negative    42. Mucor plumbeus Negative    43. Fusarium moniliforme Negative    44. Aureobasidium pullulans (pullulara) Negative    45. Rhizopus oryzae Negative    46. Botrytis cinera Negative    47. Epicoccum nigrum Negative    48. Phoma betae Negative    49. Candida Albicans Negative    50. Trichophyton mentagrophytes Negative    51. Mite, D Farinae  5,000 AU/ml Negative    52. Mite, D Pteronyssinus  5,000 AU/ml Negative    53. Cat Hair 10,000 BAU/ml Negative    54.  Dog Epithelia Negative    55. Mixed Feathers Negative    56.  Horse Epithelia Negative    57. Cockroach, German Negative    58. Mouse Negative    59. Tobacco Leaf Negative             Intradermal - 01/05/22 1428     Time Antigen Placed 1430    Allergen Manufacturer Lavella Hammock    Location Arm    Number of Test 13    Intradermal Select    Control Negative    [place] 4+    Ragweed mix Negative    Weed mix Negative    Tree mix Negative    Mold 1 Negative    Mold 2 Negative    Mold 3 Negative    Mold 4 Negative    Cat Negative    Dog Negative    Cockroach Negative    Mite mix 4+             Allergy testing results were read and interpreted by myself, documented by  clinical staff.         Salvatore Marvel, MD All[facility] and [facility]

## 2022-01-05 NOTE — Patient Instructions (Addendum)
1. Seasonal and perennial allergic rhinitis - Testing today showed: grasses and dust mites - Copy of test results provided.  - Avoidance measures provided. - Stop taking:  - Continue with: Flonase (fluticasone) one spray per nostril daily (AIM FOR EAR ON EACH SIDE) - Start taking: Astelin (azelastine) 2 sprays per nostril 1-2 times daily as needed - You can use an extra dose of the antihistamine, if needed, for breakthrough symptoms.  - Consider nasal saline rinses 1-2 times daily to remove allergens from the nasal cavities as well as help with mucous clearance (this is especially helpful to do before the nasal sprays are given) - Consider allergy shots as a means of long-term control. - Allergy shots "re-train" and "reset" the immune system to ignore environmental allergens and decrease the resulting immune response to those allergens (sneezing, itchy watery eyes, runny nose, nasal congestion, etc).    - Allergy shots improve symptoms in 75-85% of patients.  - Make an appointment to start shots in 2-3 weeks.  2. Return in about 3 months (around 04/07/2022).    Please inform us of any Emergency Department visits, hospitalizations, or changes in symptoms. Call us before going to the ED for breathing or allergy symptoms since we might be able to fit you in for a sick visit. Feel free to contact us anytime with any questions, problems, or concerns.  It was a pleasure to see you again today!  Websites that have reliable patient information: 1. American Academy of Asthma, Allergy, and Immunology: www.aaaai.org 2. Food Allergy Research and Education (FARE): foodallergy.org 3. Mothers of Asthmatics: http://www.asthmacommunitynetwork.org 4. American College of Allergy, Asthma, and Immunology: www.acaai.org   COVID-19 Vaccine Information can be found at: ShippingScam.co.uk For questions related to vaccine distribution or appointments,  please email [email or web address] or call [phone].   We realize that you might be concerned about having an allergic reaction to the COVID19 vaccines. To help with that concern, WE ARE OFFERING THE COVID19 VACCINES IN OUR OFFICE! Ask the front desk for dates!     "Like" [place] on Facebook and Instagram for our latest updates!      A healthy democracy works best when [employer] participate! Make sure you are registered to vote! If you have moved or changed any of your contact information, you will need to get this updated before voting!  In some cases, you MAY be able to register to vote online: [email or web address]      Airborne Adult Perc - 01/05/22 1358     Time Antigen Placed [place]    Location Back    Number of Test 59    1. Control-Buffer 50% Glycerol Negative    2. Control-Histamine 1 mg/ml 2+    3. Albumin saline Negative    4. [place] 3+    5. [place] 3+    6. Johnson Negative    7. [place] Blue Negative    8. Meadow Fescue 3+    9. Perennial Rye Negative    10. Sweet Vernal Negative    11. Timothy Negative    12. Cocklebur Negative    13. Burweed Marshelder Negative    14. Ragweed, short Negative    15. Ragweed, Giant Negative    16. Plantain,  English Negative    17. Lamb's Quarters Negative    18. Sheep Sorrell Negative    19. Rough Pigweed Negative    20. Marsh Elder, Rough Negative    21. Mugwort, Common Negative  22. Ash mix Negative    23. Birch mix Negative    24. Beech American Negative    25. Box, Elder Negative    26. Cedar, red Negative    27. Cottonwood, [place] Negative    28. Elm mix Negative    29. Hickory Negative    30. Maple mix Negative    31. Oak, [place] mix Negative    32. Pecan Pollen Negative    33. Pine mix Negative    34. Sycamore Eastern Negative    35. [place], Black Pollen Negative    36. Alternaria alternata Negative    37. Cladosporium Herbarum Negative     38. Aspergillus mix Negative    39. Penicillium mix Negative    40. Bipolaris sorokiniana (Helminthosporium) Negative    41. Drechslera spicifera (Curvularia) Negative    42. Mucor plumbeus Negative    43. Fusarium moniliforme Negative    44. Aureobasidium pullulans (pullulara) Negative    45. Rhizopus oryzae Negative    46. Botrytis cinera Negative    47. Epicoccum nigrum Negative    48. Phoma betae Negative    49. Candida Albicans Negative    50. Trichophyton mentagrophytes Negative    51. Mite, D Farinae  5,000 AU/ml Negative    52. Mite, D Pteronyssinus  5,000 AU/ml Negative    53. Cat Hair 10,000 BAU/ml Negative    54.  Dog Epithelia Negative    55. Mixed Feathers Negative    56. Horse Epithelia Negative    57. Cockroach, German Negative    58. Mouse Negative    59. Tobacco Leaf Negative             Intradermal - 01/05/22 1428     Time Antigen Placed 1430    Allergen Manufacturer Lavella Hammock    Location Arm    Number of Test 13    Intradermal Select    Control Negative    Johnson 4+    Ragweed mix Negative    Weed mix Negative    Tree mix Negative    Mold 1 Negative    Mold 2 Negative    Mold 3 Negative    Mold 4 Negative    Cat Negative    Dog Negative    Cockroach Negative    Mite mix 4+            Reducing Pollen Exposure  The American Academy of Allergy, Asthma and Immunology suggests the following steps to reduce your exposure to pollen during allergy seasons.    Do not hang sheets or clothing out to dry; pollen may collect on these items. Do not mow lawns or spend time around freshly cut grass; mowing stirs up pollen. Keep windows closed at night.  Keep car windows closed while driving. Minimize morning activities outdoors, a time when pollen counts are usually at their highest. Stay indoors as much as possible when pollen counts or humidity is high and on windy days when pollen tends to remain in the air longer. Use air conditioning when possible.   Many air conditioners have filters that trap the pollen spores. Use a HEPA room air filter to remove pollen form the indoor air you breathe.  Control of Dust Mite Allergen    Dust mites play a major role in allergic asthma and rhinitis.  They occur in environments with high humidity wherever human skin is found.  Dust mites absorb humidity from the atmosphere (ie, they do not drink) and feed on  organic matter (including shed human and animal skin).  Dust mites are a microscopic type of insect that you cannot see with the naked eye.  High levels of dust mites have been detected from mattresses, pillows, carpets, upholstered furniture, bed covers, clothes, soft toys and any woven material.  The principal allergen of the dust mite is found in its feces.  A gram of dust may contain 1,000 mites and 250,000 fecal particles.  Mite antigen is easily measured in the air during house cleaning activities.  Dust mites do not bite and do not cause harm to humans, other than by triggering allergies/asthma.    Ways to decrease your exposure to dust mites in your home:  Encase mattresses, box springs and pillows with a mite-impermeable barrier or cover   Wash sheets, blankets and drapes weekly in hot water (130 F) with detergent and dry them in a dryer on the hot setting.  Have the room cleaned frequently with a vacuum cleaner and a damp dust-mop.  For carpeting or rugs, vacuuming with a vacuum cleaner equipped with a high-efficiency particulate air (HEPA) filter.  The dust mite allergic individual should not be in a room which is being cleaned and should wait 1 hour after cleaning before going into the room. Do not sleep on upholstered furniture (eg, couches).   If possible removing carpeting, upholstered furniture and drapery from the home is ideal.  Horizontal blinds should be eliminated in the rooms where the person spends the most time (bedroom, study, television room).  Washable vinyl, roller-type shades are  optimal. Remove all non-washable stuffed toys from the bedroom.  Wash stuffed toys weekly like sheets and blankets above.   Reduce indoor humidity to less than 50%.  Inexpensive humidity monitors can be purchased at most hardware stores.  Do not use a humidifier as can make the problem worse and are not recommended.

## 2022-01-05 NOTE — Telephone Encounter (Signed)
Garvin Fila, MD  You; William Lope, RN 16 hours ago (5:02 PM)    Ok to hold plavix for 3 days prior to surgery with a small but acceptable periprocedural risk of TIA/stroke if patient is willing   I called the pt and relayed information he verbalized understanding and requested a letter with this information as well.   I have formulated and pt will pick up today at [facility].

## 2022-01-06 NOTE — Progress Notes (Signed)
Aeroallergen Immunotherapy   Ordering Provider: Dr. Salvatore Marvel   Patient Details  Name: William Hicks  MRN: [id number]  Date of Birth: 05-21-1951   Order 1 of 1   Vial Label: G/DM   0.3 ml (Volume)  BAU Concentration -- 7 Grass Mix* 100,000 ([address] [place], [place], [place], [place] Rye, RedTop, Sweet Vernal, Timothy)  0.2 ml (Volume)  1:20 Concentration -- [place]  0.3 ml (Volume)  BAU Concentration -- [place] 10,000  0.8 ml (Volume)   AU Concentration -- Mite Mix (DF 5,000 & DP 5,000)    1.6  ml Extract Subtotal  3.4  ml Diluent  5.0  ml Maintenance Total   Schedule:  B   Blue Vial (1:100,000): Schedule B (6 doses)  Yellow Vial (1:10,000): Schedule B (6 doses)  Green Vial (1:1,000): Schedule B (6 doses)  Red Vial (1:100): Schedule B (6 doses)   Special Instructions: none

## 2022-01-06 NOTE — Addendum Note (Signed)
Addended by: Jacqualin Combes on: 01/06/2022 04:59 PM   Modules accepted: Orders

## 2022-01-06 NOTE — Progress Notes (Signed)
VIALS NOT MADE UNTIL APPT IS SCHED 

## 2022-01-07 DIAGNOSIS — J3089 Other allergic rhinitis: Secondary | ICD-10-CM | POA: Diagnosis not present

## 2022-02-03 ENCOUNTER — Ambulatory Visit (INDEPENDENT_AMBULATORY_CARE_PROVIDER_SITE_OTHER): Payer: No Typology Code available for payment source | Admitting: *Deleted

## 2022-02-03 DIAGNOSIS — J309 Allergic rhinitis, unspecified: Secondary | ICD-10-CM

## 2022-02-03 MED ORDER — EPINEPHRINE 0.3 MG/0.3ML IJ SOAJ: 0.3000 mg | INTRAMUSCULAR | 1 refills | Status: DC | PRN

## 2022-02-03 NOTE — Progress Notes (Signed)
Immunotherapy   Patient Details  Name: William Hicks MRN: [id number] Date of Birth: 06-26-1950  02/03/2022  [name] started injections for  G-DM Following schedule: B  Frequency:1 time per week Epi-Pen:Epi-Pen Available  Consent signed and patient instructions given. Patient started allergy injections and received 0.8m of G-DM in the LUA. Patient waited 30 minutes in office and did not experience any issues.    William Hicks 02/03/2022, 10:16 AM

## 2022-02-10 ENCOUNTER — Ambulatory Visit (INDEPENDENT_AMBULATORY_CARE_PROVIDER_SITE_OTHER): Payer: No Typology Code available for payment source | Admitting: *Deleted

## 2022-02-10 DIAGNOSIS — J309 Allergic rhinitis, unspecified: Secondary | ICD-10-CM | POA: Diagnosis not present

## 2022-02-11 ENCOUNTER — Telehealth: Payer: Self-pay

## 2022-02-11 NOTE — Telephone Encounter (Signed)
Anything should be fine. Patients with hypertension should not take decongestants, although Coricidin is good for those patients. But blood thinners should not affect the cold medicine that patients take.   William Marvel, MD Allergy and [facility]

## 2022-02-11 NOTE — Telephone Encounter (Signed)
Patient called stating he has been congested for about a week. He is wondering what would be the best decongestant since he is on blood thinners.  [facility]

## 2022-02-11 NOTE — Telephone Encounter (Signed)
Please advise on a decongestant he can use that will not affect the patients blood thinner medication.

## 2022-02-12 NOTE — Telephone Encounter (Signed)
Called and left a voicemail asking for patient to return call to discuss.  °

## 2022-02-17 ENCOUNTER — Telehealth: Payer: Self-pay

## 2022-02-17 NOTE — Telephone Encounter (Signed)
Nasal and chest congestion feels like it's at the top of his chest the last4-5 days. He is using the neil med nasal rinse. No fever. He is using zyrtec 10 mg. Please advise. Pharmacy [facility].

## 2022-02-17 NOTE — Telephone Encounter (Signed)
Can we add as a televisit on someone's schedule tmr?   Salvatore Marvel, MD [facility]

## 2022-02-18 ENCOUNTER — Encounter: Payer: Self-pay | Admitting: Family Medicine

## 2022-02-18 ENCOUNTER — Ambulatory Visit (INDEPENDENT_AMBULATORY_CARE_PROVIDER_SITE_OTHER): Payer: PPO | Admitting: Family Medicine

## 2022-02-18 ENCOUNTER — Ambulatory Visit: Payer: Self-pay | Admitting: *Deleted

## 2022-02-18 VITALS — BP 124/76 | HR 98 | Temp 98.1°F | Resp 16 | Ht 73.0 in | Wt 214.6 lb

## 2022-02-18 DIAGNOSIS — F409 Phobic anxiety disorder, unspecified: Secondary | ICD-10-CM | POA: Diagnosis not present

## 2022-02-18 DIAGNOSIS — F419 Anxiety disorder, unspecified: Secondary | ICD-10-CM | POA: Insufficient documentation

## 2022-02-18 MED ORDER — HYDROXYZINE PAMOATE 25 MG PO CAPS: 25.0000 mg | ORAL_CAPSULE | Freq: Three times a day (TID) | ORAL | 1 refills | Status: DC | PRN

## 2022-02-18 MED ORDER — CLONAZEPAM 0.5 MG PO TABS: ORAL_TABLET | ORAL | 0 refills | Status: DC

## 2022-02-18 MED ORDER — BUSPIRONE HCL 5 MG PO TABS: 5.0000 mg | ORAL_TABLET | Freq: Three times a day (TID) | ORAL | 1 refills | Status: DC | PRN

## 2022-02-18 NOTE — Progress Notes (Signed)
Patient ID: William Hicks, male    DOB: 10/12/1950, 71 y.o.   MRN: [id number]  PCP: Teodora Medici, DO  Chief Complaint  Patient presents with   Follow-up   Anxiety    States he is going through something and very emotional    Subjective:   William Hicks is a 71 y.o. male, presents to clinic with CC of the following:  HPI  Anxiety, requesting meds Anxiety ongoing for many decades, seems to flare up here and there He does not explain if anything in particular has happened recently to worsen sx He has not been on daily meds Per chart hx he gets benzos for MRI's/scans due to claustrophobia     02/18/2022   11:05 AM 10/07/2021    2:20 PM  Depression screen PHQ 2/9  Decreased Interest 2 0  Down, Depressed, Hopeless 3 0  PHQ - 2 Score 5 0  Altered sleeping 3 0  Tired, decreased energy 3 0  Change in appetite 1 0  Feeling bad or failure about yourself  2 0  Trouble concentrating 3 0  Moving slowly or fidgety/restless 1 0  Suicidal thoughts 0 0  PHQ-9 Score 18 0  Difficult doing work/chores Somewhat difficult Not difficult at all      02/18/2022   11:07 AM  GAD 7 : Generalized Anxiety Score  Nervous, Anxious, on Edge 3  Control/stop worrying 2  Worry too much - different things 3  Trouble relaxing 3  Restless 2  Easily annoyed or irritable 2  Afraid - awful might happen 2  Total GAD 7 Score 17  Anxiety Difficulty Somewhat difficult    Phq 9 and GAD 7 positive - scores high, no SI   Patient Active Problem List   Diagnosis Date Noted   History of malignant neoplasm of prostate 10/07/2021   History of CVA (cerebrovascular accident) without residual deficits 10/07/2021   Mild intermittent asthma without complication 77/93/9030   Prediabetes 10/07/2021   Environmental allergies 10/07/2021   Anticoagulated by anticoagulation treatment 09/16/2020   CVA (cerebral vascular accident) ([place]) 09/09/2020   Cluster headache 09/09/2020   Acute CVA  (cerebrovascular accident) ([place]) 09/09/2020   Malignant neoplasm of prostate ([place]) 05/06/2015   Degenerative arthritis of hip 11/14/2013   HLD (hyperlipidemia) 09/25/2012   GERD (gastroesophageal reflux disease) 08/17/2011   Abscess, gluteal, right 12/22/2010   Abscess, gluteal 12/18/2010   HEARTBURN 10/30/2008   PERSONAL HISTORY OF COLONIC POLYPS 10/30/2008      Current Outpatient Medications:    albuterol (VENTOLIN HFA) 108 (90 Base) MCG/ACT inhaler, Inhale into the lungs., Disp: , Rfl:    atorvastatin (LIPITOR) 40 MG tablet, Take 2 tablets (80 mg total) by mouth at bedtime., Disp: 180 tablet, Rfl: 1   Baclofen 5 MG TABS, Take 1 tablet by mouth 3 (three) times daily., Disp: , Rfl:    Cholecalciferol 25 MCG (1000 UT) tablet, Take 1 tablet by mouth daily., Disp: , Rfl:    clopidogrel (PLAVIX) 75 MG tablet, Take 1 tablet (75 mg total) by mouth daily., Disp: 90 tablet, Rfl: 0   cyanocobalamin 1000 MCG tablet, Take 1 tablet by mouth daily., Disp: , Rfl:    diazepam (VALIUM) 5 MG tablet, as needed., Disp: , Rfl:    EPINEPHrine (EPIPEN 2-PAK) 0.3 mg/0.3 mL IJ SOAJ injection, Inject 0.3 mg into the muscle as needed for anaphylaxis., Disp: 0.3 mL, Rfl: 1   fluticasone (FLONASE) 50 MCG/ACT nasal spray, Place into the nose., Disp: ,  Rfl:    leuprolide, 6 Month, (LEUPROLIDE ACETATE, 6 MONTH,) 45 MG injection, Inject into the skin., Disp: , Rfl:    pantoprazole (PROTONIX) 40 MG tablet, TAKE ONE TABLET BY MOUTH EVERY DAY TO CONTROL STOMACH ACID, Disp: , Rfl:    polyethylene glycol powder (GLYCOLAX/MIRALAX) 17 GM/SCOOP powder, MIX 17GM (1 CAPFUL) BY MOUTH EVERY DAY AS NEEDED MIX 17 GRAMS (USE BOTTLE CAP) IN LIQUID OF CHOICE AS DIRECTED, Disp: , Rfl:    traMADol (ULTRAM) 50 MG tablet, Take 50 mg by mouth 2 (two) times daily as needed., Disp: , Rfl:    Allergies  Allergen Reactions   Pregabalin Other (See Comments) and Rash   Aspirin Diarrhea and Nausea And Vomiting     Social History    Tobacco Use   Smoking status: Former   Smokeless tobacco: Never  Scientific laboratory technician Use: Never used  Substance Use Topics   Alcohol use: Yes    Comment: 2-3 week beer   Drug use: No      Chart Review Today: I personally reviewed active problem list, medication list, allergies, family history, social history, health maintenance, notes from last encounter, lab results, imaging with the patient/caregiver today.   Review of Systems  Constitutional: Negative.   HENT: Negative.    Eyes: Negative.   Respiratory: Negative.    Cardiovascular: Negative.   Gastrointestinal: Negative.   Endocrine: Negative.   Genitourinary: Negative.   Musculoskeletal: Negative.   Skin: Negative.   Allergic/Immunologic: Negative.   Neurological: Negative.   Hematological: Negative.   Psychiatric/Behavioral:  Positive for dysphoric mood, self-injury and sleep disturbance. Negative for agitation, behavioral problems, confusion, decreased concentration, hallucinations and suicidal ideas. The patient is nervous/anxious. The patient is not hyperactive.   All other systems reviewed and are negative.      Objective:   Vitals:   02/18/22 1105  BP: 124/76  Pulse: 98  Resp: 16  Temp: 98.1 F (36.7 C)  TempSrc: Oral  SpO2: 98%  Weight: 214 lb 9.6 oz (97.3 kg)  Height: '6\' 1"'$  (1.854 m)    Body mass index is 28.31 kg/m.  Physical Exam Vitals and nursing note reviewed.  Constitutional:      General: He is not in acute distress.    Appearance: Normal appearance. He is well-developed. He is not ill-appearing, toxic-appearing or diaphoretic.  HENT:     Head: Normocephalic and atraumatic.     Nose: Nose normal.  Eyes:     General:        Right eye: No discharge.        Left eye: No discharge.     Conjunctiva/sclera: Conjunctivae normal.  Neck:     Trachea: No tracheal deviation.  Cardiovascular:     Rate and Rhythm: Normal rate and regular rhythm.  Pulmonary:     Effort: Pulmonary effort  is normal. No respiratory distress.     Breath sounds: No stridor.  Musculoskeletal:        General: Normal range of motion.  Skin:    General: Skin is warm and dry.     Findings: No rash.  Neurological:     Mental Status: He is alert.     Motor: No abnormal muscle tone.     Coordination: Coordination normal.  Psychiatric:        Attention and Perception: Attention and perception normal.        Mood and Affect: Affect is tearful.        Speech: Speech  normal.        Behavior: Behavior is cooperative.        Thought Content: Thought content does not include suicidal ideation.      Results for orders placed or performed in visit on 10/07/21  POCT HgB A1C  Result Value Ref Range   Hemoglobin A1C 6.1 (A) 4.0 - 5.6 %   HbA1c POC (<> result, manual entry)     HbA1c, POC (prediabetic range)     HbA1c, POC (controlled diabetic range)         Assessment & Plan:   Pt new to me, PCP out of office this week, he is well appearing, but tearful in office, presents for anxiety and requesting rx of brief "rescue" meds See below - chronic anxiety with multiple triggers, sx worse recently, pt presented for meds Stated he tried meds and therapy in the past at the [place] and zoloft and therapy didn't help He asks for meds like valium, ativan, xanax    ICD-10-CM   1. Anxiety disorder, unspecified type  F41.9 hydrOXYzine (VISTARIL) 25 MG capsule    busPIRone (BUSPAR) 5 MG tablet    Ambulatory referral to Psychiatry    clonazePAM (KLONOPIN) 0.5 MG tablet   Patient endorses long history of anxiety, tearful but not forthcoming today, discussed current and future management options    2. Phobia, unspecified type  F40.9 Ambulatory referral to Psychiatry    clonazePAM (KLONOPIN) 0.5 MG tablet   Multiple fears including scans, MRIs, claustrophobia, traveling/flying escalators and elevators      - hydrOXYzine (VISTARIL) 25 MG capsule; Take 1 capsule (25 mg total) by mouth every 8 (eight) hours as  needed.  Dispense: 60 capsule; Refill: 1 - busPIRone (BUSPAR) 5 MG tablet; Take 1-3 tablets (5-15 mg total) by mouth 3 (three) times daily as needed (anxiety).  Dispense: 60 tablet; Refill: 1 - Ambulatory referral to Psychiatry - clonazePAM (KLONOPIN) 0.5 MG tablet; May take 0.5- 1 mg po prn for severe anxiety/panic attack symptoms up to once a day, do not take daily, do not take with pain meds or other sedating meds  Dispense: 10 tablet; Refill: 0    Pt counseled extensively on mgmt of anxiety/phobias/panic disorders and or ptsd - strongly encouraged him to est with psychiatry for further assessment and therapy, encouraged exploring apps or selfhelp tools, info and referrals given today. I explained that controlled substances and benzos are not primary management, I explained and reviewed the risk of sedation and or dependence with frequent use so meds are to be used sparingly and only for severe symptoms after trying all the other safer prn meds, daily meds, therapy etc.  If he has frequent or continued sx I have recommended psych consult and starting daily medications.  He states this happens to him occasionally, and he has never needed strong meds for long, his controlled substance database confirms this.  Only benzos for scans.  Pain meds - cancer pt, a veteran - I feel comfortable with giving him small Rx today.  Reviewed that he cannot take with pain meds due to risk of unintentional overdose.  He says he "knows himself" and usually he just has a rough patch here or there and needs a few doses and then things get better and he doesn't need meds any more.   He did also mention anxiety sx that are severe/chronic with going in elevator, escalator, or with flying or going in MRI and he wants to be able to go out  more and travel - and I explained that we would not prescribe controlled substances for that - he would need comprehensive mental health management to address that - because it sounds chronic  and unlikely to go away, and we would not be able to given him benzos for long term management of this and I explained other primary tx for that for long term mgmt  He verbalized understanding, psych referral was ordered, a list of additional psych resources were given to him with his AVS prior to leaving.  He has f/up with his PCP soon - I encouraged him to let her know if buspar or hydroxyzine was helpful  Delsa Grana, PA-C 02/18/22 11:49 AM

## 2022-02-18 NOTE — Telephone Encounter (Signed)
  Chief Complaint: Anxiety Symptoms: Anxious "For some time, worsening past 2 weeks." No triggers "Always been afraid of elevators, flying, other things, would like something to calm me down." Panic attack last Thursday Frequency: Months, worsening Pertinent Negatives: Patient denies suicidal ideation,CP Disposition: '[]'$ ED /'[]'$ Urgent Care (no appt availability in office) / '[x]'$ Appointment(In office/virtual)/ '[]'$  [facility]/ '[]'$ Home Care/ '[]'$ Refused Recommended Disposition /'[]'$ [facility]/ '[]'$  Follow-up with PCP Additional Notes: Appt secured for today. Care advise per protocol provided, pt verbalizes understanding. Reason for Disposition  MODERATE anxiety (e.g., persistent or frequent anxiety symptoms; interferes with sleep, school, or work)  Answer Assessment - Initial Assessment Questions 1. CONCERN: "Did anything happen that prompted you to call today?"      Worsening past 2 weeks 2. ANXIETY SYMPTOMS: "Can you describe how you (your loved one; patient) have been feeling?" (e.g., tense, restless, panicky, anxious, keyed up, overwhelmed, sense of impending doom).      Anxious, "Can't relax." 3. ONSET: "How long have you been feeling this way?" (e.g., hours, days, weeks)    Months ago 4. SEVERITY: "How would you rate the level of anxiety?" (e.g., 0 - 10; or mild, moderate, severe).     Varies 5. FUNCTIONAL IMPAIRMENT: "How have these feelings affected your ability to do daily activities?" "Have you had more difficulty than usual doing your normal daily activities?" (e.g., getting better, same, worse; self-care, school, work, interactions)     Yes 6. HISTORY: "Have you felt this way before?" "Have you ever been diagnosed with an anxiety problem in the past?" (e.g., generalized anxiety disorder, panic attacks, PTSD). If Yes, ask: "How was this problem treated?" (e.g., medicines, counseling, etc.)     Yes 7. RISK OF HARM - SUICIDAL IDEATION: "Do you ever have thoughts of hurting  or killing yourself?" If Yes, ask:  "Do you have these feelings now?" "Do you have a plan on how you would do this?"     no 8. TREATMENT:  "What has been done so far to treat this anxiety?" (e.g., medicines, relaxation strategies). "What has helped?"     Tried Gabapentin once years ago 9. TREATMENT - THERAPIST: "Do you have a counselor or therapist? Name?"      10. POTENTIAL TRIGGERS: "Do you drink caffeinated beverages (e.g., coffee, colas, teas), and how much daily?" "Do you drink alcohol or use any drugs?" "Have you started any new medicines recently?"       Frightened of elevators, flying 11. PATIENT SUPPORT: "Who is with you now?" "Who do you live with?" "Do you have family or friends who you can talk to?"        Yes, wife children 5. OTHER SYMPTOMS: "Do you have any other symptoms?" (e.g., feeling depressed, trouble concentrating, trouble sleeping, trouble breathing, palpitations or fast heartbeat, chest pain, sweating, nausea, or diarrhea)       Not sleeping well at night, "Panic attack last Thursday"  Protocols used: Anxiety and Panic Attack-A-AH

## 2022-02-18 NOTE — Patient Instructions (Signed)
Try Buspar 5-15 mg up to 2-3 times a day as needed for anxiety symptoms and let us know if it is helpful  You can also try hydroxyzine 25 mg up to three times a day for anxiety  This is also used sometimes 25-50 mg at bedtime if you have anxiety and insomnia  Reserve any controlled substances for the most severe symptoms and DO NOT take with your tramadol  Panic Attack A panic attack is when you suddenly feel very afraid, uncomfortable, or nervous (anxious). A panic attack can happen when you are scared, or it may happen for no reason. A panic attack can feel like a heart attack or stroke. See your doctor when you have a panic attack to make sure you are not having a heart attack or stroke. What are the causes? Experiencing things that threaten your life, such as a war. Feeling worried or nervous for a long time (anxiety disorder). Being sad (depressed). Panic disorder. Certain medical conditions. Other causes may include: Certain medicines. Taking certain supplements. Illegal drugs. What increases the risk? Having another mental health condition. Using alcohol or drugs. Being under a lot of stress. Having events in your life that cause worry and sadness. What are the signs or symptoms? A panic attack: Starts suddenly. May last 5-10 minutes. Symptoms include one or more of these: A pounding heart. A feeling that your heart is beating in an unusual way or faster than normal (palpitations). Sweating or shaking. Feeling short of breath. Chest pain. Feeling like you may vomit (nauseous). Feeling dizzy or like you might faint. Other symptoms may include: Chills or hot flashes. Numbness or tingling in your lips, hands, or feet. Feeling confused. Fear of losing control. Fear of dying. How is this treated? A panic attack is a symptom of another condition. Treatment depends on the cause of the panic attack. If the cause is a medical problem, your doctor will treat that problem or  refer you to a specialist. If the cause is emotional, you may be given medicines or referred to a counselor. If the cause is a medicine, your doctor may tell you to stop the medicine, change your dose, or take a different medicine. If the cause is an illegal drug, treatment may involve letting the drug wear off and taking medicine to help the drug leave your body or to stop its effects. Attacks caused by heavy drug use may continue even if you stop using the drug. Most panic attacks go away after the cause is treated. Follow these instructions at home: Alcohol use Do not drink alcohol if: Your doctor tells you not to drink. You are pregnant, may be pregnant, or are planning to become pregnant. If you drink alcohol: Limit how much you have to: 0-1 drink a day for women. 0-2 drinks a day for men. Know how much alcohol is in your drink. In the [place] one drink equals one 12 oz bottle of beer (355 mL), one 5 oz glass of wine (148 mL), or one 1 oz glass of hard liquor (44 mL). General instructions  Take over-the-counter and prescription medicines only as told by your doctor. If you feel worried or nervous, try not to have caffeine. Take good care of your health. To do this: Eat healthy. Make sure to eat fresh fruits and vegetables, whole grains, lean meats, and low-fat dairy. Get enough sleep. Try to sleep for 7-8 hours each night. Exercise. Try to be active for 30 minutes 5 or more days  a week. Do not smoke or use any products that contain nicotine or tobacco. If you need help quitting, ask your doctor. Keep all follow-up visits. Where to find more information Substance Abuse and [facility]): [employer].[facility]): https://carter.com/ Contact a doctor if: Your symptoms do not get better. Your symptoms get worse. You are not able to take your medicines as told. Get help right away if: You have thoughts of hurting yourself or  others. Get help right away if you feel like you may hurt yourself or others, or have thoughts about taking your own life. Go to your nearest emergency room or: Call 911. Call the [facility] at [phone] or 988. This is open 24 hours a day. Text the Crisis Text Line at 204-840-8466. Summary A panic attack is when you suddenly feel very afraid, uncomfortable, or nervous (anxious). See your doctor when you have a panic attack to make sure that you do not have another serious problem. If you feel like you may hurt yourself or others, get help right away. Call 911. This information is not intended to replace advice given to you by your health care provider. Make sure you discuss any questions you have with your health care provider. Document Revised: 12/18/2020 Document Reviewed: 12/18/2020 Elsevier Patient Education  [place].

## 2022-02-19 ENCOUNTER — Ambulatory Visit (INDEPENDENT_AMBULATORY_CARE_PROVIDER_SITE_OTHER): Payer: No Typology Code available for payment source

## 2022-02-19 DIAGNOSIS — J309 Allergic rhinitis, unspecified: Secondary | ICD-10-CM | POA: Diagnosis not present

## 2022-02-19 NOTE — Telephone Encounter (Signed)
Called and left a voicemail asking to return call to discuss.

## 2022-02-25 NOTE — Telephone Encounter (Signed)
Looks like our office tried to call a few times in another telephone contact.

## 2022-02-26 ENCOUNTER — Ambulatory Visit (INDEPENDENT_AMBULATORY_CARE_PROVIDER_SITE_OTHER): Payer: No Typology Code available for payment source

## 2022-02-26 DIAGNOSIS — J309 Allergic rhinitis, unspecified: Secondary | ICD-10-CM | POA: Diagnosis not present

## 2022-03-03 NOTE — Telephone Encounter (Signed)
Called and left a detailed voicemail informing patient per [facility] permission.

## 2022-03-04 NOTE — Telephone Encounter (Signed)
Patient called back in regards to voicemail. I informed him of Dr.G's note.

## 2022-03-05 ENCOUNTER — Ambulatory Visit (INDEPENDENT_AMBULATORY_CARE_PROVIDER_SITE_OTHER): Payer: No Typology Code available for payment source | Admitting: *Deleted

## 2022-03-05 DIAGNOSIS — J309 Allergic rhinitis, unspecified: Secondary | ICD-10-CM | POA: Diagnosis not present

## 2022-03-12 ENCOUNTER — Ambulatory Visit (INDEPENDENT_AMBULATORY_CARE_PROVIDER_SITE_OTHER): Payer: No Typology Code available for payment source

## 2022-03-12 DIAGNOSIS — J309 Allergic rhinitis, unspecified: Secondary | ICD-10-CM | POA: Diagnosis not present

## 2022-03-19 ENCOUNTER — Ambulatory Visit (INDEPENDENT_AMBULATORY_CARE_PROVIDER_SITE_OTHER): Payer: No Typology Code available for payment source

## 2022-03-19 DIAGNOSIS — J309 Allergic rhinitis, unspecified: Secondary | ICD-10-CM

## 2022-03-24 LAB — HM COLONOSCOPY

## 2022-03-26 ENCOUNTER — Ambulatory Visit (INDEPENDENT_AMBULATORY_CARE_PROVIDER_SITE_OTHER): Payer: No Typology Code available for payment source

## 2022-03-26 DIAGNOSIS — J309 Allergic rhinitis, unspecified: Secondary | ICD-10-CM | POA: Diagnosis not present

## 2022-04-05 ENCOUNTER — Ambulatory Visit (INDEPENDENT_AMBULATORY_CARE_PROVIDER_SITE_OTHER): Payer: No Typology Code available for payment source

## 2022-04-05 DIAGNOSIS — J309 Allergic rhinitis, unspecified: Secondary | ICD-10-CM | POA: Diagnosis not present

## 2022-04-09 ENCOUNTER — Ambulatory Visit: Payer: PPO | Admitting: Internal Medicine

## 2022-04-12 ENCOUNTER — Ambulatory Visit (INDEPENDENT_AMBULATORY_CARE_PROVIDER_SITE_OTHER): Payer: No Typology Code available for payment source | Admitting: *Deleted

## 2022-04-12 DIAGNOSIS — J309 Allergic rhinitis, unspecified: Secondary | ICD-10-CM | POA: Diagnosis not present

## 2022-04-19 ENCOUNTER — Ambulatory Visit (INDEPENDENT_AMBULATORY_CARE_PROVIDER_SITE_OTHER): Payer: No Typology Code available for payment source

## 2022-04-19 DIAGNOSIS — J309 Allergic rhinitis, unspecified: Secondary | ICD-10-CM

## 2022-04-20 ENCOUNTER — Encounter: Payer: Self-pay | Admitting: Allergy & Immunology

## 2022-04-20 ENCOUNTER — Other Ambulatory Visit: Payer: Self-pay

## 2022-04-20 ENCOUNTER — Ambulatory Visit (INDEPENDENT_AMBULATORY_CARE_PROVIDER_SITE_OTHER): Payer: No Typology Code available for payment source | Admitting: Allergy & Immunology

## 2022-04-20 VITALS — BP 130/72 | HR 81 | Temp 98.1°F | Resp 20 | Ht 73.0 in | Wt 217.1 lb

## 2022-04-20 DIAGNOSIS — J4520 Mild intermittent asthma, uncomplicated: Secondary | ICD-10-CM

## 2022-04-20 DIAGNOSIS — J3089 Other allergic rhinitis: Secondary | ICD-10-CM | POA: Diagnosis not present

## 2022-04-20 DIAGNOSIS — J302 Other seasonal allergic rhinitis: Secondary | ICD-10-CM

## 2022-04-20 DIAGNOSIS — R0981 Nasal congestion: Secondary | ICD-10-CM | POA: Diagnosis not present

## 2022-04-20 MED ORDER — PREDNISONE 10 MG PO TABS: ORAL_TABLET | ORAL | 0 refills | Status: DC

## 2022-04-20 NOTE — Patient Instructions (Addendum)
1. Seasonal and perennial allergic rhinitis - Previous testing showed: grasses and dust mites - Start the prednisone burst sent in today. - This is a short term fix, but hopefully in the long term the shots should help.  - INCREASE fluticasone to two sprays per nostril TWICE DAILY.  - Continue with: Flonase (fluticasone) one spray per nostril daily (AIM FOR EAR ON EACH SIDE) - Continue with: Astelin (azelastine) 2 sprays per nostril 1-2 times daily as needed - You can use an extra dose of the antihistamine, if needed, for breakthrough symptoms.  - Consider nasal saline rinses 1-2 times daily to remove allergens from the nasal cavities as well as help with mucous clearance (this is especially helpful to do before the nasal sprays are given) - Continue with allergy shots at the same schedule.   2. GERD - Continue with pantoprazole 1-2 times daily.   3. Return in about 6 months (around 10/19/2022).    Please inform us of any Emergency Department visits, hospitalizations, or changes in symptoms. Call us before going to the ED for breathing or allergy symptoms since we might be able to fit you in for a sick visit. Feel free to contact us anytime with any questions, problems, or concerns.  It was a pleasure to see you again today!  Websites that have reliable patient information: 1. American Academy of Asthma, Allergy, and Immunology: www.aaaai.org 2. Food Allergy Research and Education (FARE): foodallergy.org 3. Mothers of Asthmatics: http://www.asthmacommunitynetwork.org 4. American College of Allergy, Asthma, and Immunology: www.acaai.org   COVID-19 Vaccine Information can be found at: [email or web address].[email or web address].uk For questions related to vaccine distribution or appointments, please email [email or web address] or call [phone].   We realize that you might be concerned about having an allergic reaction to the COVID19 vaccines. To help  with that concern, WE ARE OFFERING THE COVID19 VACCINES IN OUR OFFICE! Ask the front desk for dates!     "Like" [place] on Facebook and Instagram for our latest updates!      A healthy democracy works best when [employer] participate! Make sure you are registered to vote! If you have moved or changed any of your contact information, you will need to get this updated before voting!  In some cases, you MAY be able to register to vote online: [email or web address]       Reducing Pollen Exposure  The American Academy of Allergy, Asthma and Immunology suggests the following steps to reduce your exposure to pollen during allergy seasons.    Do not hang sheets or clothing out to dry; pollen may collect on these items. Do not mow lawns or spend time around freshly cut grass; mowing stirs up pollen. Keep windows closed at night.  Keep car windows closed while driving. Minimize morning activities outdoors, a time when pollen counts are usually at their highest. Stay indoors as much as possible when pollen counts or humidity is high and on windy days when pollen tends to remain in the air longer. Use air conditioning when possible.  Many air conditioners have filters that trap the pollen spores. Use a HEPA room air filter to remove pollen form the indoor air you breathe.  Control of Dust Mite Allergen    Dust mites play a major role in allergic asthma and rhinitis.  They occur in environments with high humidity wherever human skin is found.  Dust mites absorb humidity from the atmosphere (ie, they do not drink) and feed on organic matter (including shed human and animal skin).  Dust  mites are a microscopic type of insect that you cannot see with the naked eye.  High levels of dust mites have been detected from mattresses, pillows, carpets, upholstered furniture, bed covers, clothes, soft toys and any woven material.  The principal allergen of the dust mite is found in its  feces.  A gram of dust may contain 1,000 mites and 250,000 fecal particles.  Mite antigen is easily measured in the air during house cleaning activities.  Dust mites do not bite and do not cause harm to humans, other than by triggering allergies/asthma.    Ways to decrease your exposure to dust mites in your home:  Encase mattresses, box springs and pillows with a mite-impermeable barrier or cover   Wash sheets, blankets and drapes weekly in hot water (130 F) with detergent and dry them in a dryer on the hot setting.  Have the room cleaned frequently with a vacuum cleaner and a damp dust-mop.  For carpeting or rugs, vacuuming with a vacuum cleaner equipped with a high-efficiency particulate air (HEPA) filter.  The dust mite allergic individual should not be in a room which is being cleaned and should wait 1 hour after cleaning before going into the room. Do not sleep on upholstered furniture (eg, couches).   If possible removing carpeting, upholstered furniture and drapery from the home is ideal.  Horizontal blinds should be eliminated in the rooms where the person spends the most time (bedroom, study, television room).  Washable vinyl, roller-type shades are optimal. Remove all non-washable stuffed toys from the bedroom.  Wash stuffed toys weekly like sheets and blankets above.   Reduce indoor humidity to less than 50%.  Inexpensive humidity monitors can be purchased at most hardware stores.  Do not use a humidifier as can make the problem worse and are not recommended.

## 2022-04-20 NOTE — Progress Notes (Signed)
FOLLOW UP  Date of Service/Encounter:  04/20/22   Assessment:   Mild intermittent asthma, uncomplicated - well controlled   Seasonal and perennial allergic rhinitis - previously on immunotherapy   Recent stroke - still on blood thinners   Plan/Recommendations:   1. Seasonal and perennial allergic rhinitis - Previous testing showed: grasses and dust mites - Start the prednisone burst sent in today. - This is a short term fix, but hopefully in the long term the shots should help.  - INCREASE fluticasone to two sprays per nostril TWICE DAILY.  - Continue with: Flonase (fluticasone) one spray per nostril daily (AIM FOR EAR ON EACH SIDE) - Continue with: Astelin (azelastine) 2 sprays per nostril 1-2 times daily as needed - You can use an extra dose of the antihistamine, if needed, for breakthrough symptoms.  - Consider nasal saline rinses 1-2 times daily to remove allergens from the nasal cavities as well as help with mucous clearance (this is especially helpful to do before the nasal sprays are given) - Continue with allergy shots at the same schedule.   2. GERD - Continue with pantoprazole 1-2 times daily.   3. Return in about 6 months (around 10/19/2022).    Subjective:   William Hicks is a 71 y.o. male presenting today for follow up of  Chief Complaint  Patient presents with   Follow-up    Having trouble smelling having a follow up on allergy shots    William Hicks has a history of the following: Patient Active Problem List   Diagnosis Date Noted   Anxiety disorder 02/18/2022   History of malignant neoplasm of prostate 10/07/2021   History of CVA (cerebrovascular accident) without residual deficits 10/07/2021   Mild intermittent asthma without complication 93/79/0240   Prediabetes 10/07/2021   Environmental allergies 10/07/2021   Anticoagulated by anticoagulation treatment 09/16/2020   CVA (cerebral vascular accident) ([place]) 09/09/2020   Cluster  headache 09/09/2020   Acute CVA (cerebrovascular accident) ([place]) 09/09/2020   Malignant neoplasm of prostate ([place]) 05/06/2015   Degenerative arthritis of hip 11/14/2013   HLD (hyperlipidemia) 09/25/2012   GERD (gastroesophageal reflux disease) 08/17/2011   Abscess, gluteal, right 12/22/2010   Abscess, gluteal 12/18/2010   HEARTBURN 10/30/2008   PERSONAL HISTORY OF COLONIC POLYPS 10/30/2008    History obtained from: chart review and patient.  William Hicks is a 71 y.o. male presenting for a follow up visit.  He was last seen in August 2023 as a new patient.  At that time, he was positive only to dust mites.  We started him on Astelin 2 sprays per nostril up to twice daily.  We continue with Flonase 1 spray per nostril daily.  He did decide to start allergen immunotherapy.  He had previously been on allergen immunotherapy but had stopped them for unknown reasons.  They were helping when he was on them before.  Since last visit, he has done well. He has intermittent episodes where he loses his sense of smell. This is typiucally a bad time of the year for him. He is using fluticasone very often and cetirizine.   He did see Dr Laurance Flatten again three weeks ago. He was having some swallowing issues that were diagnosed as GERD. He was placed on pantoprazole which seems to maybe be helping. He is not entirely sure that this is helping with more questioning. Per his last note, it was recommended that he do the Protonix BID, but he tells me that he tried and it  did not seem to help. He is also doing a CT on December 8th to look some something vasculature related.   He is doing well post stroke. He thinks that he is back to 100%. Vision has returned completely.   Otherwise, there have been no changes to his past medical history, surgical history, family history, or social history.    Review of Systems  Constitutional: Negative.  Negative for chills, fever, malaise/fatigue and weight loss.  HENT: Negative.   Negative for congestion, ear discharge and ear pain.   Eyes:  Negative for pain, discharge and redness.  Respiratory:  Negative for cough, sputum production, shortness of breath and wheezing.   Cardiovascular: Negative.  Negative for chest pain and palpitations.  Gastrointestinal:  Negative for abdominal pain, constipation, diarrhea, heartburn, nausea and vomiting.  Skin: Negative.  Negative for itching and rash.  Neurological:  Negative for dizziness and headaches.  Endo/Heme/Allergies:  Positive for environmental allergies. Does not bruise/bleed easily.       Objective:   Blood pressure 130/72, pulse 81, temperature 98.1 F (36.7 C), resp. rate 20, height '6\' 1"'$  (1.854 m), weight 217 lb 1.6 oz (98.5 kg), SpO2 98 %. Body mass index is 28.64 kg/m.    Physical Exam Vitals reviewed.  Constitutional:      Appearance: He is well-developed.  HENT:     Head: Normocephalic and atraumatic.     Right Ear: Tympanic membrane, ear canal and external ear normal. No drainage, swelling or tenderness. Tympanic membrane is not injected, scarred, erythematous, retracted or bulging.     Left Ear: Tympanic membrane, ear canal and external ear normal. No drainage, swelling or tenderness. Tympanic membrane is not injected, scarred, erythematous, retracted or bulging.     Nose: No nasal deformity, septal deviation, mucosal edema or rhinorrhea.     Right Turbinates: Enlarged, swollen and pale.     Left Turbinates: Enlarged, swollen and pale.     Right Sinus: No maxillary sinus tenderness or frontal sinus tenderness.     Left Sinus: No maxillary sinus tenderness or frontal sinus tenderness.     Comments: No nasal polyps noted.     Mouth/Throat:     Mouth: Mucous membranes are not pale and not dry.     Pharynx: Uvula midline.  Eyes:     General:        Right eye: No discharge.        Left eye: No discharge.     Conjunctiva/sclera: Conjunctivae normal.     Right eye: Right conjunctiva is not injected.  No chemosis.    Left eye: Left conjunctiva is not injected. No chemosis.    Pupils: Pupils are equal, round, and reactive to light.  Cardiovascular:     Rate and Rhythm: Normal rate and regular rhythm.     Heart sounds: Normal heart sounds.  Pulmonary:     Effort: Pulmonary effort is normal. No tachypnea, accessory muscle usage or respiratory distress.     Breath sounds: Normal breath sounds. No wheezing, rhonchi or rales.  Chest:     Chest wall: No tenderness.  Abdominal:     Tenderness: There is no abdominal tenderness. There is no guarding or rebound.  Lymphadenopathy:     Head:     Right side of head: No submandibular, tonsillar or occipital adenopathy.     Left side of head: No submandibular, tonsillar or occipital adenopathy.     Cervical: No cervical adenopathy.  Skin:    Coloration: Skin is  not pale.     Findings: No abrasion, erythema, petechiae or rash. Rash is not papular, urticarial or vesicular.  Neurological:     Mental Status: He is alert.  Psychiatric:        Behavior: Behavior is cooperative.      Diagnostic studies: none         Salvatore Marvel, MD  [facility]

## 2022-04-26 ENCOUNTER — Ambulatory Visit (INDEPENDENT_AMBULATORY_CARE_PROVIDER_SITE_OTHER): Payer: No Typology Code available for payment source

## 2022-04-26 DIAGNOSIS — J309 Allergic rhinitis, unspecified: Secondary | ICD-10-CM | POA: Diagnosis not present

## 2022-04-26 DIAGNOSIS — J302 Other seasonal allergic rhinitis: Secondary | ICD-10-CM

## 2022-05-03 ENCOUNTER — Ambulatory Visit (INDEPENDENT_AMBULATORY_CARE_PROVIDER_SITE_OTHER): Payer: No Typology Code available for payment source

## 2022-05-03 DIAGNOSIS — J309 Allergic rhinitis, unspecified: Secondary | ICD-10-CM

## 2022-05-03 DIAGNOSIS — J302 Other seasonal allergic rhinitis: Secondary | ICD-10-CM

## 2022-05-10 ENCOUNTER — Ambulatory Visit (INDEPENDENT_AMBULATORY_CARE_PROVIDER_SITE_OTHER): Payer: No Typology Code available for payment source

## 2022-05-10 DIAGNOSIS — J302 Other seasonal allergic rhinitis: Secondary | ICD-10-CM

## 2022-05-10 DIAGNOSIS — J309 Allergic rhinitis, unspecified: Secondary | ICD-10-CM

## 2022-05-19 ENCOUNTER — Ambulatory Visit (INDEPENDENT_AMBULATORY_CARE_PROVIDER_SITE_OTHER): Payer: No Typology Code available for payment source

## 2022-05-19 DIAGNOSIS — J309 Allergic rhinitis, unspecified: Secondary | ICD-10-CM | POA: Diagnosis not present

## 2022-05-20 NOTE — Progress Notes (Signed)
   Acute Office Visit  Subjective:     Patient ID: William Hicks, male    DOB: Sep 05, 1950, 71 y.o.   MRN: [id number]  Chief Complaint  Patient presents with   Ear Pain    Right, x1 week    HPI Patient is in today for right ear fullness with pain over the right side of his face and teeth for 1 week. Has a history of severe seasonal allergies, gets allergy shots. Was treated with Prednisone about 1 month ago for similar symptoms. Denies ear pain but describes a fullness feeling. No cough or fevers but sinus pain/pressure and swelling. Mild congestion with clear rhinorrhea. Does take Zyrtec and Flonase daily. Endorses tinnitus or "echo" in his ears. Denies drainage from the ears or hearing difficulty.   Review of Systems  Constitutional:  Negative for chills and fever.  HENT:  Positive for congestion, sinus pain and tinnitus. Negative for ear discharge, ear pain, hearing loss and sore throat.   Respiratory:  Negative for cough and shortness of breath.   Cardiovascular:  Negative for chest pain.        Objective:    BP 136/68   Pulse 91   Resp 16   Ht '6\' 1"'$  (1.854 m)   Wt 219 lb (99.3 kg)   SpO2 98%   BMI 28.89 kg/m    Physical Exam Constitutional:      Appearance: Normal appearance.  HENT:     Head: Normocephalic and atraumatic.     Comments: Pain over right maxillary sinus infection    Right Ear: Ear canal and external ear normal.     Left Ear: Ear canal and external ear normal.     Ears:     Comments: Bilateral eustachian tube dysfunction    Nose: Congestion present.     Mouth/Throat:     Mouth: Mucous membranes are moist.     Pharynx: Oropharynx is clear.  Eyes:     Conjunctiva/sclera: Conjunctivae normal.  Cardiovascular:     Rate and Rhythm: Normal rate and regular rhythm.  Pulmonary:     Effort: Pulmonary effort is normal.     Breath sounds: Normal breath sounds.  Skin:    General: Skin is warm and dry.  Neurological:     General: No focal deficit  present.     Mental Status: He is alert. Mental status is at baseline.  Psychiatric:        Mood and Affect: Mood normal.        Behavior: Behavior normal.     No results found for any visits on 05/21/22.      Assessment & Plan:   1. Acute non-recurrent maxillary sinusitis: Treat with Medrol dose pack and antibiotics as his symptoms are consistent with acute maxillary sinus infection. Continue daily antihistamine and Flonase 2 sprays on each side twice daily. Follow up if symptoms worsen or fail to improve.   - methylPREDNISolone (MEDROL DOSEPAK) 4 MG TBPK tablet; .med  Dispense: 1 each; Refill: 0 - amoxicillin-clavulanate (AUGMENTIN) 875-125 MG tablet; Take 1 tablet by mouth 2 (two) times daily for 5 days.  Dispense: 10 tablet; Refill: 0   Return if symptoms worsen or fail to improve.  Teodora Medici, DO

## 2022-05-21 ENCOUNTER — Encounter: Payer: Self-pay | Admitting: Internal Medicine

## 2022-05-21 ENCOUNTER — Ambulatory Visit (INDEPENDENT_AMBULATORY_CARE_PROVIDER_SITE_OTHER): Payer: PPO | Admitting: Internal Medicine

## 2022-05-21 VITALS — BP 136/68 | HR 91 | Resp 16 | Ht 73.0 in | Wt 219.0 lb

## 2022-05-21 DIAGNOSIS — J0100 Acute maxillary sinusitis, unspecified: Secondary | ICD-10-CM

## 2022-05-21 MED ORDER — AMOXICILLIN-POT CLAVULANATE 875-125 MG PO TABS: 1.0000 | ORAL_TABLET | Freq: Two times a day (BID) | ORAL | 0 refills | Status: AC

## 2022-05-21 MED ORDER — METHYLPREDNISOLONE 4 MG PO TBPK: ORAL_TABLET | ORAL | 0 refills | Status: DC

## 2022-05-26 ENCOUNTER — Ambulatory Visit (INDEPENDENT_AMBULATORY_CARE_PROVIDER_SITE_OTHER): Payer: No Typology Code available for payment source

## 2022-05-26 DIAGNOSIS — J309 Allergic rhinitis, unspecified: Secondary | ICD-10-CM | POA: Diagnosis not present

## 2022-05-27 ENCOUNTER — Ambulatory Visit (INDEPENDENT_AMBULATORY_CARE_PROVIDER_SITE_OTHER): Payer: No Typology Code available for payment source | Admitting: Allergy & Immunology

## 2022-05-27 ENCOUNTER — Encounter: Payer: Self-pay | Admitting: Allergy & Immunology

## 2022-05-27 ENCOUNTER — Other Ambulatory Visit: Payer: Self-pay

## 2022-05-27 VITALS — BP 130/60 | HR 74 | Temp 97.5°F | Resp 12 | Wt 217.7 lb

## 2022-05-27 DIAGNOSIS — R0981 Nasal congestion: Secondary | ICD-10-CM

## 2022-05-27 DIAGNOSIS — J3089 Other allergic rhinitis: Secondary | ICD-10-CM

## 2022-05-27 DIAGNOSIS — J302 Other seasonal allergic rhinitis: Secondary | ICD-10-CM

## 2022-05-27 DIAGNOSIS — J4520 Mild intermittent asthma, uncomplicated: Secondary | ICD-10-CM

## 2022-05-27 DIAGNOSIS — H9311 Tinnitus, right ear: Secondary | ICD-10-CM

## 2022-05-27 MED ORDER — AMOXICILLIN-POT CLAVULANATE 400-57 MG/5ML PO SUSR: 875.0000 mg | Freq: Two times a day (BID) | ORAL | 0 refills | Status: AC

## 2022-05-27 NOTE — Patient Instructions (Addendum)
1. Seasonal and perennial allergic rhinitis - Previous testing showed: grasses and dust mites - I sent in Augmentin liquid to the [facility]: 10.9 mL twice daily for 7 days - Definitely keep the ENT appointment with Dr. Laurance Flatten.  - Continue with: Flonase (fluticasone) one spray per nostril daily (AIM FOR EAR ON EACH SIDE) - Continue with: Astelin (azelastine) 2 sprays per nostril 1-2 times daily as needed - You can use an extra dose of the antihistamine, if needed, for breakthrough symptoms.  - Consider nasal saline rinses 1-2 times daily to remove allergens from the nasal cavities as well as help with mucous clearance (this is especially helpful to do before the nasal sprays are given) - Continue with allergy shots at the same schedule.   2. GERD - Continue with pantoprazole 1-2 times daily.   3. Return in about 6 months (around 11/25/2022).    Please inform us of any Emergency Department visits, hospitalizations, or changes in symptoms. Call us before going to the ED for breathing or allergy symptoms since we might be able to fit you in for a sick visit. Feel free to contact us anytime with any questions, problems, or concerns.  It was a pleasure to see you again today!  Websites that have reliable patient information: 1. American Academy of Asthma, Allergy, and Immunology: www.aaaai.org 2. Food Allergy Research and Education (FARE): foodallergy.org 3. Mothers of Asthmatics: http://www.asthmacommunitynetwork.org 4. American College of Allergy, Asthma, and Immunology: www.acaai.org   COVID-19 Vaccine Information can be found at: ShippingScam.co.uk For questions related to vaccine distribution or appointments, please email [email or web address] or call [phone].   We realize that you might be concerned about having an allergic reaction to the COVID19 vaccines. To help with that concern, WE ARE OFFERING THE COVID19 VACCINES IN OUR  OFFICE! Ask the front desk for dates!     "Like" [place] on Facebook and Instagram for our latest updates!      A healthy democracy works best when [employer] participate! Make sure you are registered to vote! If you have moved or changed any of your contact information, you will need to get this updated before voting!  In some cases, you MAY be able to register to vote online: [email or web address]

## 2022-05-27 NOTE — Progress Notes (Signed)
FOLLOW UP  Date of Service/Encounter:  05/27/22   Assessment:   Mild intermittent asthma, uncomplicated - well controlled   Seasonal and perennial allergic rhinitis - previously on immunotherapy   Recent stroke - still on blood thinners   Right-sided tinnitus - going to see Dr. Laurance Flatten in February 2024  Right-sided AOM - sending in liquid instead of the tablet     Plan/Recommendations:   1. Seasonal and perennial allergic rhinitis - Previous testing showed: grasses and dust mites - I sent in Augmentin liquid to the [facility]: 10.9 mL twice daily for 7 days - Definitely keep the ENT appointment with Dr. Laurance Flatten.  - Continue with: Flonase (fluticasone) one spray per nostril daily (AIM FOR EAR ON EACH SIDE) - Continue with: Astelin (azelastine) 2 sprays per nostril 1-2 times daily as needed - You can use an extra dose of the antihistamine, if needed, for breakthrough symptoms.  - Consider nasal saline rinses 1-2 times daily to remove allergens from the nasal cavities as well as help with mucous clearance (this is especially helpful to do before the nasal sprays are given) - Continue with allergy shots at the same schedule.   2. GERD - Continue with pantoprazole 1-2 times daily.   3. Return in about 6 months (around 11/25/2022).    Subjective:   William Hicks is a 72 y.o. male presenting today for follow up of  Chief Complaint  Patient presents with   Other    Inner ear issues for the last two weeks. Echo in ears. Could not take the antibiotic due to swallowing issues.     William Hicks has a history of the following: Patient Active Problem List   Diagnosis Date Noted   Anxiety disorder 02/18/2022   History of malignant neoplasm of prostate 10/07/2021   History of CVA (cerebrovascular accident) without residual deficits 10/07/2021   Mild intermittent asthma without complication 19/41/7408   Prediabetes 10/07/2021   Environmental allergies 10/07/2021    Anticoagulated by anticoagulation treatment 09/16/2020   CVA (cerebral vascular accident) ([place]) 09/09/2020   Cluster headache 09/09/2020   Acute CVA (cerebrovascular accident) ([place]) 09/09/2020   Malignant neoplasm of prostate ([place]) 05/06/2015   Degenerative arthritis of hip 11/14/2013   HLD (hyperlipidemia) 09/25/2012   GERD (gastroesophageal reflux disease) 08/17/2011   Abscess, gluteal, right 12/22/2010   Abscess, gluteal 12/18/2010   HEARTBURN 10/30/2008   PERSONAL HISTORY OF COLONIC POLYPS 10/30/2008    History obtained from: chart review and patient.  William Hicks is a 72 y.o. male presenting for a sick visit.  He was last seen in November 2023.  At that time, we started him on a prednisone burst.  We increased his Flonase to 2 sprays per nostril twice daily.  We continue with Astelin as well.  He also continue with allergy shots at the same schedule.  For his GERD, we continue with pantoprazole 1-2 times daily.  Since last visit, he has not done well. He has a constant sensation of ringing in his right ear. He was placed on a steroid pack and Augmentin. He did take the steroid, but he never took the Augmentin because it was a large pill. He did not do COVID testing. He feels that his symptoms started around two weeks ago. He does see Dr. Laurance Flatten and he actually called him two weeks ago but they did not have appointments until February.   He describes an echo and is here.  He did have that make an appointment  with Dr. Laurance Flatten, but he could not be seen as an office visit until February 2024.  He has not had any drainage from the right side of his ear.  Allergy shots are going well. William Hicks is on allergen immunotherapy. He receives one injection. Immunotherapy script #1 contains grasses and dust mites. He currently receives 0.46m of the GREEN vial (1/1,000). He started shots September of 2023 and not yet reached maintenance.  Otherwise, there have been no changes to his past medical history,  surgical history, family history, or social history.    Review of Systems  Constitutional: Negative.  Negative for chills, fever, malaise/fatigue and weight loss.  HENT:  Positive for ear pain. Negative for congestion and ear discharge.   Eyes:  Negative for pain, discharge and redness.  Respiratory:  Negative for cough, sputum production, shortness of breath and wheezing.   Cardiovascular: Negative.  Negative for chest pain and palpitations.  Gastrointestinal:  Negative for abdominal pain, constipation, diarrhea, heartburn, nausea and vomiting.  Skin: Negative.  Negative for itching and rash.  Neurological:  Negative for dizziness and headaches.  Endo/Heme/Allergies:  Positive for environmental allergies. Does not bruise/bleed easily.  All other systems reviewed and are negative.      Objective:   Blood pressure 130/60, pulse 74, temperature (!) 97.5 F (36.4 C), temperature source Temporal, resp. rate 12, weight 217 lb 11.2 oz (98.7 kg), SpO2 98 %. Body mass index is 28.72 kg/m.    Physical Exam Vitals reviewed.  Constitutional:      Appearance: He is well-developed.  HENT:     Head: Normocephalic and atraumatic.     Right Ear: Ear canal and external ear normal. No drainage, swelling or tenderness. Tympanic membrane is bulging. Tympanic membrane is not injected, scarred, erythematous or retracted.     Left Ear: Tympanic membrane, ear canal and external ear normal. No drainage, swelling or tenderness. Tympanic membrane is not injected, scarred, erythematous, retracted or bulging.     Nose: No nasal deformity, septal deviation, mucosal edema or rhinorrhea.     Right Turbinates: Enlarged, swollen and pale.     Left Turbinates: Enlarged, swollen and pale.     Right Sinus: No maxillary sinus tenderness or frontal sinus tenderness.     Left Sinus: No maxillary sinus tenderness or frontal sinus tenderness.     Comments: No nasal polyps noted.     Mouth/Throat:     Mouth: Mucous  membranes are not pale and not dry.     Pharynx: Uvula midline.  Eyes:     General:        Right eye: No discharge.        Left eye: No discharge.     Conjunctiva/sclera: Conjunctivae normal.     Right eye: Right conjunctiva is not injected. No chemosis.    Left eye: Left conjunctiva is not injected. No chemosis.    Pupils: Pupils are equal, round, and reactive to light.  Cardiovascular:     Rate and Rhythm: Normal rate and regular rhythm.     Heart sounds: Normal heart sounds.  Pulmonary:     Effort: Pulmonary effort is normal. No tachypnea, accessory muscle usage or respiratory distress.     Breath sounds: Normal breath sounds. No wheezing, rhonchi or rales.  Chest:     Chest wall: No tenderness.  Abdominal:     Tenderness: There is no abdominal tenderness. There is no guarding or rebound.  Lymphadenopathy:     Head:  Right side of head: No submandibular, tonsillar or occipital adenopathy.     Left side of head: No submandibular, tonsillar or occipital adenopathy.     Cervical: No cervical adenopathy.  Skin:    Coloration: Skin is not pale.     Findings: No abrasion, erythema, petechiae or rash. Rash is not papular, urticarial or vesicular.  Neurological:     Mental Status: He is alert.  Psychiatric:        Behavior: Behavior is cooperative.      Diagnostic studies: none     Salvatore Marvel, MD  [facility] [place] [facility]

## 2022-05-31 NOTE — Progress Notes (Unsigned)
   Acute Office Visit  Subjective:     Patient ID: METRO EDENFIELD, male    DOB: 01-Feb-1951, 72 y.o.   MRN: [id number]  No chief complaint on file.   HPI Patient is in today for tinnitus.   EAR PAIN Duration: {Blank single:19197::"days","weeks","months"} Involved ear(s): {Blank single:19197::"left","right","bilateral"} Severity:  {Blank single:19197::"mild","moderate","severe","1/10","2/10","3/10","4/10","5/10","6/10","7/10","8/10","9/10","10/10"}  Quality:  {Blank multiple:19196::"sharp","dull","aching","burning","cramping","ill-defined","itchy","pressure-like","pulling","shooting","sore","stabbing","tender","tearing","throbbing"} Fever: {Blank single:19197::"yes","no"} Otorrhea: {Blank single:19197::"yes","no"} Upper respiratory infection symptoms: {Blank single:19197::"yes","no"} Pruritus: {Blank single:19197::"yes","no"} Hearing loss: {Blank single:19197::"yes","no"} Water immersion {Blank single:19197::"yes","no"} Using Q-tips: {Blank single:19197::"yes","no"} Recurrent otitis media: {Blank single:19197::"yes","no"} Status: {Blank multiple:19196::"better","worse","stable","fluctuating"} Treatments attempted: {Blank single:19197::"none","pseudoephedrine"}   ROS      Objective:    There were no vitals taken for this visit. {Vitals History (Optional):23777}  Physical Exam  No results found for any visits on 06/01/22.      Assessment & Plan:   Problem List Items Addressed This Visit   None   No orders of the defined types were placed in this encounter.   No follow-ups on file.  Teodora Medici, DO

## 2022-06-01 ENCOUNTER — Ambulatory Visit (INDEPENDENT_AMBULATORY_CARE_PROVIDER_SITE_OTHER): Payer: PPO | Admitting: Internal Medicine

## 2022-06-01 ENCOUNTER — Encounter: Payer: Self-pay | Admitting: Internal Medicine

## 2022-06-01 VITALS — BP 120/64 | HR 86 | Temp 98.0°F | Resp 16 | Ht 73.0 in | Wt 220.0 lb

## 2022-06-01 DIAGNOSIS — H9313 Tinnitus, bilateral: Secondary | ICD-10-CM | POA: Diagnosis not present

## 2022-06-01 DIAGNOSIS — H6993 Unspecified Eustachian tube disorder, bilateral: Secondary | ICD-10-CM | POA: Diagnosis not present

## 2022-06-01 NOTE — Patient Instructions (Signed)
It was great seeing you today!  Plan discussed at today's visit: -Use Ryaltris nasal spray instead of Flonase for now -See ENT on 1/25 -No ear infection or wax on exam  Follow up in: as needed   Take care and let us know if you have any questions or concerns prior to your next visit.  Dr. Rosana Berger

## 2022-06-07 ENCOUNTER — Ambulatory Visit (INDEPENDENT_AMBULATORY_CARE_PROVIDER_SITE_OTHER): Payer: No Typology Code available for payment source

## 2022-06-07 DIAGNOSIS — J3089 Other allergic rhinitis: Secondary | ICD-10-CM | POA: Diagnosis not present

## 2022-06-07 DIAGNOSIS — J302 Other seasonal allergic rhinitis: Secondary | ICD-10-CM

## 2022-06-07 DIAGNOSIS — J309 Allergic rhinitis, unspecified: Secondary | ICD-10-CM | POA: Diagnosis not present

## 2022-06-14 ENCOUNTER — Ambulatory Visit (INDEPENDENT_AMBULATORY_CARE_PROVIDER_SITE_OTHER): Payer: No Typology Code available for payment source

## 2022-06-14 DIAGNOSIS — J309 Allergic rhinitis, unspecified: Secondary | ICD-10-CM

## 2022-06-21 ENCOUNTER — Ambulatory Visit (INDEPENDENT_AMBULATORY_CARE_PROVIDER_SITE_OTHER): Payer: No Typology Code available for payment source

## 2022-06-21 DIAGNOSIS — J309 Allergic rhinitis, unspecified: Secondary | ICD-10-CM

## 2022-06-28 ENCOUNTER — Ambulatory Visit (INDEPENDENT_AMBULATORY_CARE_PROVIDER_SITE_OTHER): Payer: No Typology Code available for payment source

## 2022-06-28 DIAGNOSIS — J309 Allergic rhinitis, unspecified: Secondary | ICD-10-CM | POA: Diagnosis not present

## 2022-07-05 ENCOUNTER — Ambulatory Visit (INDEPENDENT_AMBULATORY_CARE_PROVIDER_SITE_OTHER): Payer: No Typology Code available for payment source | Admitting: *Deleted

## 2022-07-05 DIAGNOSIS — J309 Allergic rhinitis, unspecified: Secondary | ICD-10-CM

## 2022-07-12 ENCOUNTER — Ambulatory Visit (INDEPENDENT_AMBULATORY_CARE_PROVIDER_SITE_OTHER): Payer: No Typology Code available for payment source

## 2022-07-12 DIAGNOSIS — J309 Allergic rhinitis, unspecified: Secondary | ICD-10-CM | POA: Diagnosis not present

## 2022-07-19 ENCOUNTER — Ambulatory Visit (INDEPENDENT_AMBULATORY_CARE_PROVIDER_SITE_OTHER): Payer: No Typology Code available for payment source | Admitting: *Deleted

## 2022-07-19 DIAGNOSIS — J309 Allergic rhinitis, unspecified: Secondary | ICD-10-CM

## 2022-07-22 DIAGNOSIS — M5126 Other intervertebral disc displacement, lumbar region: Secondary | ICD-10-CM | POA: Diagnosis not present

## 2022-07-22 DIAGNOSIS — M5416 Radiculopathy, lumbar region: Secondary | ICD-10-CM | POA: Diagnosis not present

## 2022-07-23 ENCOUNTER — Other Ambulatory Visit: Payer: Self-pay | Admitting: Gastroenterology

## 2022-07-23 DIAGNOSIS — R109 Unspecified abdominal pain: Secondary | ICD-10-CM | POA: Diagnosis not present

## 2022-07-23 DIAGNOSIS — Z8711 Personal history of peptic ulcer disease: Secondary | ICD-10-CM | POA: Diagnosis not present

## 2022-07-23 DIAGNOSIS — R1032 Left lower quadrant pain: Secondary | ICD-10-CM | POA: Diagnosis not present

## 2022-07-23 DIAGNOSIS — R110 Nausea: Secondary | ICD-10-CM | POA: Diagnosis not present

## 2022-07-23 DIAGNOSIS — Z86010 Personal history of colonic polyps: Secondary | ICD-10-CM | POA: Diagnosis not present

## 2022-07-23 DIAGNOSIS — Z8673 Personal history of transient ischemic attack (TIA), and cerebral infarction without residual deficits: Secondary | ICD-10-CM | POA: Diagnosis not present

## 2022-07-23 DIAGNOSIS — K219 Gastro-esophageal reflux disease without esophagitis: Secondary | ICD-10-CM | POA: Diagnosis not present

## 2022-07-26 ENCOUNTER — Ambulatory Visit (INDEPENDENT_AMBULATORY_CARE_PROVIDER_SITE_OTHER): Payer: No Typology Code available for payment source

## 2022-07-26 DIAGNOSIS — J309 Allergic rhinitis, unspecified: Secondary | ICD-10-CM

## 2022-07-30 ENCOUNTER — Encounter: Payer: Self-pay | Admitting: Internal Medicine

## 2022-08-02 ENCOUNTER — Ambulatory Visit (INDEPENDENT_AMBULATORY_CARE_PROVIDER_SITE_OTHER): Payer: No Typology Code available for payment source | Admitting: *Deleted

## 2022-08-02 DIAGNOSIS — J309 Allergic rhinitis, unspecified: Secondary | ICD-10-CM | POA: Diagnosis not present

## 2022-08-08 ENCOUNTER — Other Ambulatory Visit: Payer: Self-pay | Admitting: Internal Medicine

## 2022-08-08 DIAGNOSIS — E782 Mixed hyperlipidemia: Secondary | ICD-10-CM

## 2022-08-09 ENCOUNTER — Ambulatory Visit (INDEPENDENT_AMBULATORY_CARE_PROVIDER_SITE_OTHER): Payer: No Typology Code available for payment source | Admitting: *Deleted

## 2022-08-09 DIAGNOSIS — J309 Allergic rhinitis, unspecified: Secondary | ICD-10-CM | POA: Diagnosis not present

## 2022-08-09 NOTE — Telephone Encounter (Signed)
Requested Prescriptions  Pending Prescriptions Disp Refills   atorvastatin (LIPITOR) 40 MG tablet [Pharmacy Med Name: ATORVASTATIN 40MG  TABLETS] 180 tablet 0    Sig: TAKE 2 TABLETS(80 MG) BY MOUTH AT BEDTIME     Cardiovascular:  Antilipid - Statins Failed - 08/08/2022  8:05 AM      Failed - Lipid Panel in normal range within the last 12 months    Cholesterol, Total  Date Value Ref Range Status  06/16/2021 161 100 - 199 mg/dL Final   LDL Chol Calc (NIH)  Date Value Ref Range Status  06/16/2021 76 0 - 99 mg/dL Final   HDL  Date Value Ref Range Status  06/16/2021 55 >39 mg/dL Final   Triglycerides  Date Value Ref Range Status  06/16/2021 177 (H) 0 - 149 mg/dL Final         Passed - Patient is not pregnant      Passed - Valid encounter within last 12 months    Recent Outpatient Visits           2 months ago Eustachian tube dysfunction, bilateral   [facility], DO   2 months ago Acute non-recurrent maxillary sinusitis   [facility] Teodora Medici, DO   5 months ago Anxiety disorder, unspecified type   [facility] Delsa Grana, PA-C   10 months ago Mixed hyperlipidemia   [facility] Teodora Medici, DO       Future Appointments             In 2 months Ernst Bowler, Gwenith Daily, [place] [facility] [place] at [facility]

## 2022-08-11 ENCOUNTER — Telehealth: Payer: Self-pay

## 2022-08-11 NOTE — Telephone Encounter (Signed)
Created surgical clearance letter

## 2022-08-15 DIAGNOSIS — R1030 Lower abdominal pain, unspecified: Secondary | ICD-10-CM | POA: Diagnosis not present

## 2022-08-16 ENCOUNTER — Ambulatory Visit (INDEPENDENT_AMBULATORY_CARE_PROVIDER_SITE_OTHER): Payer: No Typology Code available for payment source | Admitting: *Deleted

## 2022-08-16 DIAGNOSIS — J309 Allergic rhinitis, unspecified: Secondary | ICD-10-CM

## 2022-08-17 DIAGNOSIS — J3089 Other allergic rhinitis: Secondary | ICD-10-CM | POA: Diagnosis not present

## 2022-08-17 DIAGNOSIS — R1030 Lower abdominal pain, unspecified: Secondary | ICD-10-CM | POA: Diagnosis not present

## 2022-08-17 NOTE — Progress Notes (Signed)
VIAL EXP 08-17-23

## 2022-08-18 ENCOUNTER — Telehealth: Payer: Self-pay | Admitting: *Deleted

## 2022-08-18 NOTE — Telephone Encounter (Signed)
Not able to the patient. I mailed the letter to pt home address.

## 2022-08-23 ENCOUNTER — Ambulatory Visit (INDEPENDENT_AMBULATORY_CARE_PROVIDER_SITE_OTHER): Payer: No Typology Code available for payment source | Admitting: *Deleted

## 2022-08-23 DIAGNOSIS — J309 Allergic rhinitis, unspecified: Secondary | ICD-10-CM

## 2022-08-31 ENCOUNTER — Ambulatory Visit (INDEPENDENT_AMBULATORY_CARE_PROVIDER_SITE_OTHER): Payer: No Typology Code available for payment source

## 2022-08-31 DIAGNOSIS — J309 Allergic rhinitis, unspecified: Secondary | ICD-10-CM

## 2022-08-31 DIAGNOSIS — A09 Infectious gastroenteritis and colitis, unspecified: Secondary | ICD-10-CM | POA: Diagnosis not present

## 2022-09-07 ENCOUNTER — Ambulatory Visit (INDEPENDENT_AMBULATORY_CARE_PROVIDER_SITE_OTHER): Payer: No Typology Code available for payment source

## 2022-09-07 DIAGNOSIS — J309 Allergic rhinitis, unspecified: Secondary | ICD-10-CM | POA: Diagnosis not present

## 2022-09-13 ENCOUNTER — Ambulatory Visit (INDEPENDENT_AMBULATORY_CARE_PROVIDER_SITE_OTHER): Payer: No Typology Code available for payment source

## 2022-09-13 DIAGNOSIS — J309 Allergic rhinitis, unspecified: Secondary | ICD-10-CM | POA: Diagnosis not present

## 2022-09-16 ENCOUNTER — Telehealth: Payer: Self-pay | Admitting: Internal Medicine

## 2022-09-16 NOTE — Telephone Encounter (Signed)
Called patient to schedule Medicare Annual Wellness Visit (AWV). Left message for patient to call back and schedule Medicare Annual Wellness Visit (AWV).  AWV-I: 04/25/202024  Please schedule an appointment at any time with [facility].  If any questions, please contact me at [phone].  Thank you ,  Randon Goldsmith Care Guide [facility] AWV TEAM Direct Dial: [phone]

## 2022-09-20 ENCOUNTER — Ambulatory Visit (INDEPENDENT_AMBULATORY_CARE_PROVIDER_SITE_OTHER): Payer: No Typology Code available for payment source

## 2022-09-20 DIAGNOSIS — J309 Allergic rhinitis, unspecified: Secondary | ICD-10-CM

## 2022-09-21 ENCOUNTER — Ambulatory Visit (INDEPENDENT_AMBULATORY_CARE_PROVIDER_SITE_OTHER): Payer: No Typology Code available for payment source | Admitting: Allergy & Immunology

## 2022-09-21 VITALS — BP 126/80 | HR 82 | Temp 98.6°F | Resp 16 | Ht 72.0 in | Wt 222.3 lb

## 2022-09-21 DIAGNOSIS — J302 Other seasonal allergic rhinitis: Secondary | ICD-10-CM | POA: Diagnosis not present

## 2022-09-21 DIAGNOSIS — H9311 Tinnitus, right ear: Secondary | ICD-10-CM

## 2022-09-21 DIAGNOSIS — J4520 Mild intermittent asthma, uncomplicated: Secondary | ICD-10-CM | POA: Diagnosis not present

## 2022-09-21 DIAGNOSIS — R0981 Nasal congestion: Secondary | ICD-10-CM | POA: Diagnosis not present

## 2022-09-21 DIAGNOSIS — J3089 Other allergic rhinitis: Secondary | ICD-10-CM

## 2022-09-21 MED ORDER — PREDNISONE 10 MG PO TABS: ORAL_TABLET | ORAL | 0 refills | Status: DC

## 2022-09-21 NOTE — Progress Notes (Unsigned)
FOLLOW UP  Date of Service/Encounter:  09/21/22   Assessment:   Mild intermittent asthma, uncomplicated - well controlled   Seasonal and perennial allergic rhinitis - previously on immunotherapy   Recent stroke - still on blood thinners    Right-sided tinnitus - going to see Dr. Christell Constant in February 2024   Right-sided AOM - sending in liquid instead of the tablet     Plan/Recommendations:    There are no Patient Instructions on file for this visit.   Subjective:   William Hicks is a 72 y.o. male presenting today for follow up of No chief complaint on file.   William Hicks has a history of the following: Patient Active Problem List   Diagnosis Date Noted   Anxiety disorder 02/18/2022   History of malignant neoplasm of prostate 10/07/2021   History of CVA (cerebrovascular accident) without residual deficits 10/07/2021   Mild intermittent asthma without complication 10/07/2021   Prediabetes 10/07/2021   Environmental allergies 10/07/2021   Anticoagulated by anticoagulation treatment 09/16/2020   CVA (cerebral vascular accident) ([facility]) 09/09/2020   Cluster headache 09/09/2020   Acute CVA (cerebrovascular accident) ([facility]) 09/09/2020   Malignant neoplasm of prostate ([facility]) 05/06/2015   Degenerative arthritis of hip 11/14/2013   HLD (hyperlipidemia) 09/25/2012   GERD (gastroesophageal reflux disease) 08/17/2011   Abscess, gluteal, right 12/22/2010   Abscess, gluteal 12/18/2010   HEARTBURN 10/30/2008   PERSONAL HISTORY OF COLONIC POLYPS 10/30/2008    History obtained from: chart review and {Persons; PED relatives w/patient:19415::"patient"}.  William Hicks is a 72 y.o. male presenting for {Blank single:19197::"a food challenge","a drug challenge","skin testing","a sick visit","an evaluation of ***","a follow up visit"}. He was last seen in January 2024. At that time, we sent in Augmentin twice daily for 7 days to treat right-sided acute otitis media. We continued with  Flonase as well as Astelin. We also continued with the use of Protonix as needed.  For his right-sided tinnitus, we sent him to see Dr. Christell Constant.  Since the last visit, he has done well.   {Blank single:19197::"Asthma/Respiratory Symptom History: ***"," "}  Allergic Rhinitis Symptom History: He started having a sore throat and congestion on Sunday. He is using fluticasone nasal spray and Claritin for this. This is typically a bad time of the year for his symptoms. He has not had this in a long time.   He did see Dr. Christell Constant who recommended changing from Zyrtec to Claritin. This cleared up the tinnitus. He is using the NeilMed every morning. He is having minimal sinus pressure, just nasal congestion and throat soreness. He has not added Mucinex. It ddi seem that he was starting to ease up yesterday but it got worse overnight.   Allergy shots are going well. William Hicks is on allergen immunotherapy. He receives one injection. Immunotherapy script #1 contains grasses and dust mites. He currently receives 0.60mL of the GREEN vial (1/1,000). He started shots September of 2023 and not yet reached maintenance.    {Blank single:19197::"Food Allergy Symptom History: ***"," "}  {Blank single:19197::"Skin Symptom History: ***"," "}  {Blank single:19197::"GERD Symptom History: ***"," "}  Otherwise, there have been no changes to his past medical history, surgical history, family history, or social history.    ROS     Objective:   There were no vitals taken for this visit. There is no height or weight on file to calculate BMI.    Physical Exam   Diagnostic studies:    Spirometry: results normal (FEV1: 2.26/77%, FVC: 2.73/71%,  FEV1/FVC: 83%).    Spirometry consistent with normal pattern. '  Allergy Studies: {Blank single:19197::"none","labs sent instead"," "}    {Blank single:19197::"Allergy testing results were read and interpreted by myself, documented by clinical staff."," "}      Malachi Bonds, MD  [facility]

## 2022-09-21 NOTE — Patient Instructions (Addendum)
1. Seasonal and perennial allergic rhinitis (grasses and dust mites) - This still might be a cold that you are experiencing, so we are going to hold off on antibiotics. - ADD ON the Astelin  - Continue with: Flonase (fluticasone) one spray per nostril TWICE daily (AIM FOR EAR ON EACH SIDE) - ADD ON: Astelin (azelastine) 1 spray per nostril TWICE daily - Start the prednisone taper if you are not feeling better on Thursday (BUT I want to hear from you!).  - You can use an extra dose of the antihistamine, if needed, for breakthrough symptoms.  - Consider nasal saline rinses 1-2 times daily to remove allergens from the nasal cavities as well as help with mucous clearance (this is especially helpful to do before the nasal sprays are given) - Continue with allergy shots at the same schedule.   2. GERD - Continue with pantoprazole 1-2 times daily.   3. Return in about 6 months (around 03/23/2023).    Please inform us of any Emergency Department visits, hospitalizations, or changes in symptoms. Call us before going to the ED for breathing or allergy symptoms since we might be able to fit you in for a sick visit. Feel free to contact us anytime with any questions, problems, or concerns.  It was a pleasure to see you again today!  Websites that have reliable patient information: 1. American Academy of Asthma, Allergy, and Immunology: www.aaaai.org 2. Food Allergy Research and Education (FARE): foodallergy.org 3. Mothers of Asthmatics: http://www.asthmacommunitynetwork.org 4. American College of Allergy, Asthma, and Immunology: www.acaai.org   COVID-19 Vaccine Information can be found at: [email or web address].[email or web address] For questions related to vaccine distribution or appointments, please email [email or web address] or call [phone].   We realize that you might be concerned about having an allergic reaction to the COVID19 vaccines. To help with  that concern, WE ARE OFFERING THE COVID19 VACCINES IN OUR OFFICE! Ask the front desk for dates!     "Like" [place] on Facebook and Instagram for our latest updates!      A healthy democracy works best when [employer] participate! Make sure you are registered to vote! If you have moved or changed any of your contact information, you will need to get this updated before voting!  In some cases, you MAY be able to register to vote online: [email or web address]

## 2022-09-22 ENCOUNTER — Telehealth: Payer: Self-pay | Admitting: Allergy & Immunology

## 2022-09-22 ENCOUNTER — Encounter: Payer: Self-pay | Admitting: Allergy & Immunology

## 2022-09-22 MED ORDER — FLUTICASONE PROPIONATE 50 MCG/ACT NA SUSP: 1.0000 | Freq: Two times a day (BID) | NASAL | 5 refills | Status: DC

## 2022-09-22 MED ORDER — PANTOPRAZOLE SODIUM 40 MG PO TBEC: DELAYED_RELEASE_TABLET | ORAL | 5 refills | Status: DC

## 2022-09-22 MED ORDER — AZELASTINE HCL 0.1 % NA SOLN: 1.0000 | Freq: Two times a day (BID) | NASAL | 5 refills | Status: DC

## 2022-09-22 NOTE — Telephone Encounter (Signed)
Patient picked up medication  on 09/22/22 and will call Friday if no change with symptoms while taking the prednisone. Patient verbalized understanding that he needs to take the prednisone first before we try an antibiotic.

## 2022-09-22 NOTE — Telephone Encounter (Signed)
Patient called and said he felt really bad and call pharmacy last night and they did not have meds yet. And also thanks he needs a antibiotic. [phone]

## 2022-09-22 NOTE — Telephone Encounter (Signed)
I see where meds were sent in today. Please advise to antibiotic. Thank You.

## 2022-09-22 NOTE — Telephone Encounter (Signed)
I was going to start with prednisone - all of his mucous was clear and the time course was not consistent with bacterial sinusitis. I asked him to call back on Thursday after her had been on the prednisone for a couple of days.

## 2022-09-23 NOTE — Telephone Encounter (Signed)
Patient called and stated he was feeling much better, he started feeling better on 5/1.

## 2022-09-24 ENCOUNTER — Telehealth: Payer: Self-pay | Admitting: Allergy & Immunology

## 2022-09-24 NOTE — Telephone Encounter (Signed)
Patient authorization([id number]) expires on 10/22/2022 and LVM for patient to call back and advise.  Faxed renewal to [facility], [phone]

## 2022-09-24 NOTE — Telephone Encounter (Signed)
Patient called and advised patient to call VA to get a new authorization.

## 2022-09-27 ENCOUNTER — Ambulatory Visit (INDEPENDENT_AMBULATORY_CARE_PROVIDER_SITE_OTHER): Payer: No Typology Code available for payment source | Admitting: *Deleted

## 2022-09-27 DIAGNOSIS — J309 Allergic rhinitis, unspecified: Secondary | ICD-10-CM | POA: Diagnosis not present

## 2022-10-04 ENCOUNTER — Ambulatory Visit (INDEPENDENT_AMBULATORY_CARE_PROVIDER_SITE_OTHER): Payer: No Typology Code available for payment source | Admitting: *Deleted

## 2022-10-04 DIAGNOSIS — J309 Allergic rhinitis, unspecified: Secondary | ICD-10-CM

## 2022-10-11 ENCOUNTER — Ambulatory Visit (INDEPENDENT_AMBULATORY_CARE_PROVIDER_SITE_OTHER): Payer: No Typology Code available for payment source | Admitting: *Deleted

## 2022-10-11 DIAGNOSIS — J309 Allergic rhinitis, unspecified: Secondary | ICD-10-CM

## 2022-10-19 ENCOUNTER — Ambulatory Visit (INDEPENDENT_AMBULATORY_CARE_PROVIDER_SITE_OTHER): Payer: No Typology Code available for payment source | Admitting: *Deleted

## 2022-10-19 ENCOUNTER — Ambulatory Visit: Payer: PPO | Admitting: Allergy & Immunology

## 2022-10-19 DIAGNOSIS — J309 Allergic rhinitis, unspecified: Secondary | ICD-10-CM

## 2022-10-25 ENCOUNTER — Ambulatory Visit (INDEPENDENT_AMBULATORY_CARE_PROVIDER_SITE_OTHER): Payer: No Typology Code available for payment source | Admitting: *Deleted

## 2022-10-25 DIAGNOSIS — J309 Allergic rhinitis, unspecified: Secondary | ICD-10-CM

## 2022-10-26 DIAGNOSIS — J3089 Other allergic rhinitis: Secondary | ICD-10-CM | POA: Diagnosis not present

## 2022-10-26 NOTE — Progress Notes (Signed)
VIAL EXP 10-26-23 

## 2022-11-01 ENCOUNTER — Ambulatory Visit (INDEPENDENT_AMBULATORY_CARE_PROVIDER_SITE_OTHER): Payer: No Typology Code available for payment source | Admitting: *Deleted

## 2022-11-01 DIAGNOSIS — J309 Allergic rhinitis, unspecified: Secondary | ICD-10-CM | POA: Diagnosis not present

## 2022-11-08 ENCOUNTER — Ambulatory Visit (INDEPENDENT_AMBULATORY_CARE_PROVIDER_SITE_OTHER): Payer: No Typology Code available for payment source | Admitting: *Deleted

## 2022-11-08 DIAGNOSIS — J309 Allergic rhinitis, unspecified: Secondary | ICD-10-CM

## 2022-11-09 ENCOUNTER — Encounter: Payer: PPO | Admitting: Internal Medicine

## 2022-11-15 ENCOUNTER — Ambulatory Visit (INDEPENDENT_AMBULATORY_CARE_PROVIDER_SITE_OTHER): Payer: No Typology Code available for payment source | Admitting: *Deleted

## 2022-11-15 DIAGNOSIS — J309 Allergic rhinitis, unspecified: Secondary | ICD-10-CM | POA: Diagnosis not present

## 2022-11-19 NOTE — Telephone Encounter (Signed)
Received new authorization, [id number] with dates of 10-25-2022 to 10-25-2023.   Authorization has been indexed to chart:  AMB REFERRAL - VA AUTH. AAC 10-25-22 to 10-25-23

## 2022-11-22 ENCOUNTER — Ambulatory Visit (INDEPENDENT_AMBULATORY_CARE_PROVIDER_SITE_OTHER): Payer: No Typology Code available for payment source

## 2022-11-22 DIAGNOSIS — J309 Allergic rhinitis, unspecified: Secondary | ICD-10-CM | POA: Diagnosis not present

## 2022-11-29 ENCOUNTER — Ambulatory Visit (INDEPENDENT_AMBULATORY_CARE_PROVIDER_SITE_OTHER): Payer: No Typology Code available for payment source | Admitting: *Deleted

## 2022-11-29 DIAGNOSIS — J309 Allergic rhinitis, unspecified: Secondary | ICD-10-CM

## 2022-12-06 ENCOUNTER — Ambulatory Visit (INDEPENDENT_AMBULATORY_CARE_PROVIDER_SITE_OTHER): Payer: No Typology Code available for payment source | Admitting: *Deleted

## 2022-12-06 DIAGNOSIS — J309 Allergic rhinitis, unspecified: Secondary | ICD-10-CM | POA: Diagnosis not present

## 2022-12-07 ENCOUNTER — Telehealth: Payer: Self-pay | Admitting: Allergy & Immunology

## 2022-12-07 NOTE — Telephone Encounter (Signed)
Patient called back and his covid test on hand is over 53 months old. I spoke with Ferdie Ping and she agreed to recommending the patient being seen with Urgent care to get tested due to all of his symptoms. He is going to update [place] once he is seen with the urgent care. He wanted to keep his visit on 12/09/2022, I informed him to call us with an update as he can not come in with COVID. Patient has [employer] and we are unable to do a televisit.

## 2022-12-07 NOTE — Telephone Encounter (Signed)
Patient took covid test at his daughter's house. Patient tested negative for covid. Patient would like to keep his appointment for Thursday. Patient advised to wear a mask when coming into office. Patient verbalized understanding.

## 2022-12-07 NOTE — Progress Notes (Deleted)
Name: William Hicks   MRN: [id number]    DOB: [birth year]   Date:12/07/2022       Progress Note  Subjective  Chief Complaint  No chief complaint on file.   HPI  Patient presents for annual CPE ***.  IPSS Questionnaire (AUA-7): Over the past month.   1)  How often have you had a sensation of not emptying your bladder completely after you finish urinating?  {Rating:19227}  2)  How often have you had to urinate again less than two hours after you finished urinating? {Rating:19227}  3)  How often have you found you stopped and started again several times when you urinated?  {Rating:19227}  4) How difficult have you found it to postpone urination?  {Rating:19227}  5) How often have you had a weak urinary stream?  {Rating:19227}  6) How often have you had to push or strain to begin urination?  {Rating:19227}  7) How many times did you most typically get up to urinate from the time you went to bed until the time you got up in the morning?  {Rating:19228}  Total score:  0-7 mildly symptomatic   8-19 moderately symptomatic   20-35 severely symptomatic     Diet: *** Exercise: *** Last Dental Exam: **** Last Eye Exam: ***  Depression: phq 9 is {gen pos [id number]}    06/01/2022   12:51 PM 05/21/2022    8:02 AM 02/18/2022   11:05 AM 10/07/2021    2:20 PM  Depression screen PHQ 2/9  Decreased Interest 0 0 2 0  Down, Depressed, Hopeless 0 0 3 0  PHQ - 2 Score 0 0 5 0  Altered sleeping 0 0 3 0  Tired, decreased energy 0 0 3 0  Change in appetite 0 0 1 0  Feeling bad or failure about yourself  0 0 2 0  Trouble concentrating 0 0 3 0  Moving slowly or fidgety/restless 0 0 1 0  Suicidal thoughts 0 0 0 0  PHQ-9 Score 0 0 18 0  Difficult doing work/chores Not difficult at all  Somewhat difficult Not difficult at all    Hypertension:  BP Readings from Last 3 Encounters:  09/21/22 126/80  06/01/22 120/64  05/27/22 130/60    Obesity: Wt Readings from Last 3 Encounters:   09/21/22 222 lb 4.8 oz (100.8 kg)  06/01/22 220 lb (99.8 kg)  05/27/22 217 lb 11.2 oz (98.7 kg)   BMI Readings from Last 3 Encounters:  09/21/22 30.15 kg/m  06/01/22 29.03 kg/m  05/27/22 28.72 kg/m     Lipids:  Lab Results  Component Value Date   CHOL 161 06/16/2021   CHOL 256 (H) 09/10/2020   Lab Results  Component Value Date   HDL 55 06/16/2021   HDL 78 09/10/2020   Lab Results  Component Value Date   LDLCALC 76 06/16/2021   LDLCALC 154 (H) 09/10/2020   Lab Results  Component Value Date   TRIG 177 (H) 06/16/2021   TRIG 120 09/10/2020   Lab Results  Component Value Date   CHOLHDL 2.9 06/16/2021   CHOLHDL 3.3 09/10/2020   No results found for: "LDLDIRECT" Glucose:  Glucose, Bld  Date Value Ref Range Status  07/10/2021 95 70 - 99 mg/dL Final    Comment:    Glucose reference range applies only to samples taken after fasting for at least 8 hours.  09/13/2020 122 (H) 70 - 99 mg/dL Final    Comment:    Glucose reference range  applies only to samples taken after fasting for at least 8 hours.  09/12/2020 153 (H) 70 - 99 mg/dL Final    Comment:    Glucose reference range applies only to samples taken after fasting for at least 8 hours.   Glucose-Capillary  Date Value Ref Range Status  09/10/2020 109 (H) 70 - 99 mg/dL Final    Comment:    Glucose reference range applies only to samples taken after fasting for at least 8 hours.     ***  Married STD testing and prevention (HIV/chl/gon/syphilis):  yes HIV ordered Sexual history:  Hep C Screening: ordered Skin cancer: Discussed monitoring for atypical lesions Colorectal cancer:  Prostate cancer:  yes No results found for: "PSA" ordered   Lung cancer:  Low Dose CT Chest recommended if Age 58-80 years, 30 pack-year currently smoking OR have quit w/in 15years. Patient  yes a candidate for screening  ordered AAA: The USPSTF recommends one-time screening with ultrasonography in men ages 49 to 75 years who  have ever smoked. Patient   {Response; is /is not/na:63}, a candidate for screening  ECG:  09/11/20  Vaccines:   HPV: aged out N/A Tdap: 10/13/21 Shingrix:  Pneumonia:  Flu: Refused COVID-19:up to date 5 doses  Advanced Care Planning: A voluntary discussion about advance care planning including the explanation and discussion of advance directives.  Discussed health care proxy and Living will, and the patient was able to identify a health care proxy as ***.  Patient {DOES_DOES ZOX:09604} have a living will and power of attorney of health care   Patient Active Problem List   Diagnosis Date Noted   Anxiety disorder 02/18/2022   History of malignant neoplasm of prostate 10/07/2021   History of CVA (cerebrovascular accident) without residual deficits 10/07/2021   Mild intermittent asthma without complication 10/07/2021   Prediabetes 10/07/2021   Environmental allergies 10/07/2021   Anticoagulated by anticoagulation treatment 09/16/2020   CVA (cerebral vascular accident) (HCC) 09/09/2020   Cluster headache 09/09/2020   Acute CVA (cerebrovascular accident) (HCC) 09/09/2020   Malignant neoplasm of prostate (HCC) 05/06/2015   Degenerative arthritis of hip 11/14/2013   HLD (hyperlipidemia) 09/25/2012   GERD (gastroesophageal reflux disease) 08/17/2011   Abscess, gluteal, right 12/22/2010   Abscess, gluteal 12/18/2010   HEARTBURN 10/30/2008   PERSONAL HISTORY OF COLONIC POLYPS 10/30/2008    Past Surgical History:  Procedure Laterality Date   LITHOTRIPSY  2008    Family History  Problem Relation Age of Onset   Heart failure Mother    Alzheimer's disease Father     Social History   Socioeconomic History   Marital status: Married    Spouse name: Not on file   Number of children: Not on file   Years of education: Not on file   Highest education level: Not on file  Occupational History   Not on file  Tobacco Use   Smoking status: Former   Smokeless tobacco: Never  Vaping Use    Vaping status: Never Used  Substance and Sexual Activity   Alcohol use: Yes    Comment: 2-3 week beer   Drug use: No   Sexual activity: Not Currently  Other Topics Concern   Not on file  Social History Narrative   Not on file   Social Determinants of Health   Financial Resource Strain: Not on file  Food Insecurity: No Food Insecurity (11/12/2020)   [facility]   Hunger Vital Sign    Worried About Running Out  of Food in the Last Year: Never true    Ran Out of Food in the Last Year: Never true  Transportation Needs: Not on file  Physical Activity: Not on file  Stress: Not on file  Social Connections: Unknown (09/29/2021)   [facility]   Social Network    Social Network: Not on file  Intimate Partner Violence: Unknown (08/26/2021)   Received from [facility]   HITS    Physically Hurt: Not on file    Insult or Talk Down To: Not on file    Threaten Physical Harm: Not on file    Scream or Curse: Not on file     Current Outpatient Medications:    albuterol (VENTOLIN HFA) 108 (90 Base) MCG/ACT inhaler, Inhale into the lungs., Disp: , Rfl:    atorvastatin (LIPITOR) 40 MG tablet, TAKE 2 TABLETS(80 MG) BY MOUTH AT BEDTIME, Disp: 180 tablet, Rfl: 0   azelastine (ASTELIN) 0.1 % nasal spray, Place 1 spray into both nostrils 2 (two) times daily., Disp: 30 mL, Rfl: 5   Baclofen 5 MG TABS, Take 1 tablet by mouth as needed. (Patient not taking: Reported on 09/21/2022), Disp: , Rfl:    busPIRone (BUSPAR) 5 MG tablet, Take 1-3 tablets (5-15 mg total) by mouth 3 (three) times daily as needed (anxiety). (Patient not taking: Reported on 09/21/2022), Disp: 60 tablet, Rfl: 1   Cholecalciferol 25 MCG (1000 UT) tablet, Take 1 tablet by mouth daily. (Patient not taking: Reported on 09/21/2022), Disp: , Rfl:    clonazePAM (KLONOPIN) 0.5 MG tablet, May take 0.5- 1 mg po prn for severe anxiety/panic attack symptoms up to once a day, do not take daily, do not take with pain  meds or other sedating meds, Disp: 10 tablet, Rfl: 0   clopidogrel (PLAVIX) 75 MG tablet, Take 1 tablet (75 mg total) by mouth daily., Disp: 90 tablet, Rfl: 0   cyanocobalamin 1000 MCG tablet, Take 1 tablet by mouth daily., Disp: , Rfl:    diazepam (VALIUM) 5 MG tablet, as needed., Disp: , Rfl:    docusate sodium (COLACE) 50 MG capsule, Take 50 mg by mouth daily., Disp: , Rfl:    EPINEPHrine (EPIPEN 2-PAK) 0.3 mg/0.3 mL IJ SOAJ injection, Inject 0.3 mg into the muscle as needed for anaphylaxis., Disp: 0.3 mL, Rfl: 1   fluticasone (FLONASE) 50 MCG/ACT nasal spray, Place into the nose., Disp: , Rfl:    fluticasone (FLONASE) 50 MCG/ACT nasal spray, Place 1 spray into both nostrils in the morning and at bedtime., Disp: 16 g, Rfl: 5   hydrOXYzine (VISTARIL) 25 MG capsule, Take 1 capsule (25 mg total) by mouth every 8 (eight) hours as needed., Disp: 60 capsule, Rfl: 1   leuprolide, 6 Month, (LEUPROLIDE ACETATE, 6 MONTH,) 45 MG injection, Inject into the skin., Disp: , Rfl:    methylPREDNISolone (MEDROL DOSEPAK) 4 MG TBPK tablet, .med, Disp: 1 each, Rfl: 0   pantoprazole (PROTONIX) 40 MG tablet, TAKE ONE TABLET BY MOUTH EVERY DAY TO CONTROL STOMACH ACID, Disp: , Rfl:    pantoprazole (PROTONIX) 40 MG tablet, Take 40mg  1-2 times daily., Disp: 60 tablet, Rfl: 5   polyethylene glycol powder (GLYCOLAX/MIRALAX) 17 GM/SCOOP powder, MIX 17GM (1 CAPFUL) BY MOUTH EVERY DAY AS NEEDED MIX 17 GRAMS (USE BOTTLE CAP) IN LIQUID OF CHOICE AS DIRECTED, Disp: , Rfl:    predniSONE (DELTASONE) 10 MG tablet, Take two tablets (20mg ) twice daily for three days, then one tablet (10mg ) twice daily for three  days, then STOP., Disp: 18 tablet, Rfl: 0   sucralfate (CARAFATE) 1 g tablet, Take 1 g by mouth daily with breakfast., Disp: , Rfl:    traMADol (ULTRAM) 50 MG tablet, Take 50 mg by mouth 2 (two) times daily as needed., Disp: , Rfl:   Allergies  Allergen Reactions   Pregabalin Other (See Comments) and Rash   Aspirin Diarrhea  and Nausea And Vomiting     ROS  ***   Objective  There were no vitals filed for this visit.  There is no height or weight on file to calculate BMI.  Physical Exam ***  No results found for this or any previous visit (from the past 2160 hour(s)).   Fall Risk:    06/01/2022   12:51 PM 05/21/2022    8:01 AM 02/18/2022   11:04 AM 10/07/2021    2:20 PM  Fall Risk   Falls in the past year? 0 0 0 0  Number falls in past yr: 0 0 0 0  Injury with Fall? 0 0 0 0  Risk for fall due to :  No Fall Risks No Fall Risks   Follow up  Falls prevention discussed Falls prevention discussed;Education provided      Functional Status Survey:      Assessment & Plan  There are no diagnoses linked to this encounter.   -Prostate cancer screening and PSA options (with potential risks and benefits of testing vs not testing) were discussed along with recent recs/guidelines. -USPSTF grade A and B recommendations reviewed with patient; age-appropriate recommendations, preventive care, screening tests, etc discussed and encouraged; healthy living encouraged; see AVS for patient education given to patient -Discussed importance of 150 minutes of physical activity weekly, eat two servings of fish weekly, eat one serving of tree nuts ( cashews, pistachios, pecans, almonds.Marland Kitchen) every other day, eat 6 servings of fruit/vegetables daily and drink plenty of water and avoid sweet beverages.  -Reviewed Health Maintenance: {yes/no/default n/a:21102::"not applicable"}

## 2022-12-07 NOTE — Telephone Encounter (Signed)
That is fine. Thanks!   Malachi Bonds, MD [facility]

## 2022-12-07 NOTE — Telephone Encounter (Signed)
Patient called requesting an appointment with Dr. Dellis Anes. Patient states he feels unwell & has lost his sense of taste and smell. Patient denies fevers. Scheduled patient to see Dr. Dellis Anes on 12-09-2022. Advised patient to test for COVID in the meantime and reach out to [place] if he does test positive.

## 2022-12-09 ENCOUNTER — Other Ambulatory Visit: Payer: Self-pay

## 2022-12-09 ENCOUNTER — Telehealth: Payer: Self-pay | Admitting: Allergy & Immunology

## 2022-12-09 ENCOUNTER — Encounter: Payer: PPO | Admitting: Internal Medicine

## 2022-12-09 ENCOUNTER — Encounter: Payer: Self-pay | Admitting: Allergy & Immunology

## 2022-12-09 ENCOUNTER — Ambulatory Visit (INDEPENDENT_AMBULATORY_CARE_PROVIDER_SITE_OTHER): Payer: No Typology Code available for payment source | Admitting: Allergy & Immunology

## 2022-12-09 VITALS — BP 120/80 | HR 67 | Temp 98.0°F | Ht 72.0 in | Wt 212.8 lb

## 2022-12-09 DIAGNOSIS — R430 Anosmia: Secondary | ICD-10-CM | POA: Diagnosis not present

## 2022-12-09 DIAGNOSIS — R0981 Nasal congestion: Secondary | ICD-10-CM | POA: Diagnosis not present

## 2022-12-09 DIAGNOSIS — J0100 Acute maxillary sinusitis, unspecified: Secondary | ICD-10-CM

## 2022-12-09 DIAGNOSIS — J4520 Mild intermittent asthma, uncomplicated: Secondary | ICD-10-CM

## 2022-12-09 DIAGNOSIS — J302 Other seasonal allergic rhinitis: Secondary | ICD-10-CM

## 2022-12-09 DIAGNOSIS — J3089 Other allergic rhinitis: Secondary | ICD-10-CM

## 2022-12-09 MED ORDER — AMOXICILLIN-POT CLAVULANATE 875-125 MG PO TABS: 1.0000 | ORAL_TABLET | Freq: Two times a day (BID) | ORAL | 0 refills | Status: DC

## 2022-12-09 NOTE — Progress Notes (Signed)
FOLLOW UP  Date of Service/Encounter:  12/09/22   Assessment:   Mild intermittent asthma, uncomplicated - well controlled   Seasonal and perennial allergic rhinitis - currently on immunotherapy with maintenance reached January 2024  Acute sinusitis - starting Augmentin today    Recent stroke - still on blood thinners    Right-sided tinnitus - improved with change from cetirizine to loratadine   Plan/Recommendations:   1. Seasonal and perennial allergic rhinitis (grasses and dust mites) - with allergic sinusitis today - COVID testing was negative. - We are going to start Augmentin twice daily for seven days to treat this.  - Continue with: Astelin one spray per nostril up to twice daily (AIM FOR EAR ON EACH SIDE) - Continue with: Flonase (fluticasone) one spray per nostril TWICE daily (AIM FOR EAR ON EACH SIDE) - You can use an extra dose of the antihistamine, if needed, for breakthrough symptoms.  - Consider nasal saline rinses 1-2 times daily to remove allergens from the nasal cavities as well as help with mucous clearance (this is especially helpful to do before the nasal sprays are given) - Continue with allergy shots at the same schedule.  - We will continue allergy shots through January 2027 at least (up to January 2029).   2. GERD - Continue with pantoprazole 1-2 times daily.   3. Return in about 6 months (around 06/11/2023).    Subjective:   William Hicks is a 72 y.o. male presenting today for follow up of  Chief Complaint  Patient presents with   Acute Visit    C/o headache throbbing cold in throat and lungs  Loss of smell and taste. X 2 days    William Hicks has a history of the following: Patient Active Problem List   Diagnosis Date Noted   Anxiety disorder 02/18/2022   History of malignant neoplasm of prostate 10/07/2021   History of CVA (cerebrovascular accident) without residual deficits 10/07/2021   Mild intermittent asthma without  complication 10/07/2021   Prediabetes 10/07/2021   Environmental allergies 10/07/2021   Anticoagulated by anticoagulation treatment 09/16/2020   CVA (cerebral vascular accident) (HCC) 09/09/2020   Cluster headache 09/09/2020   Acute CVA (cerebrovascular accident) (HCC) 09/09/2020   Malignant neoplasm of prostate (HCC) 05/06/2015   Degenerative arthritis of hip 11/14/2013   HLD (hyperlipidemia) 09/25/2012   GERD (gastroesophageal reflux disease) 08/17/2011   Abscess, gluteal, right 12/22/2010   Abscess, gluteal 12/18/2010   HEARTBURN 10/30/2008   PERSONAL HISTORY OF COLONIC POLYPS 10/30/2008    History obtained from: chart review and patient.  William Hicks is a 72 y.o. male presenting for a follow up visit. We last saw him in April 2024. At that time, we started him on Astelin in addition to his Flonase.  We recommended that he start a prednisone burst if he was not feeling better in a few days.  We continue to monitor his allergy shots.  GERD was controlled with pantoprazole 1-2 times daily.  Since last visit, he has largely done well.  Allergic Rhinitis Symptom History: He has been having some sinus issues for a while. But he went to a convention in [place] for a religious event. It was 4000 people there. He has not had a fever. He has been having sinus pain and head throbbing. He is unable to taste. HE did have a negative COVID test on Tuesday.   Shots are going well. He has not had any problems with advancing including large local reactions.  Allergy shots are going well. Blade is on allergen immunotherapy. He receives one injection. Immunotherapy script #1 contains grasses and dust mites. He currently receives 0.68mL of the RED vial (1/100). He started shots September of 2023 and reached maintenance in January 2024.   GERD Symptom History: He remains on Protonix daily.  Otherwise, there have been no changes to his past medical history, surgical history, family history, or social  history.    Review of systems otherwise negative other than that mentioned in the HPI.    Objective:   Blood pressure 120/80, pulse 67, temperature 98 F (36.7 C), height 6' (1.829 m), weight 212 lb 12.8 oz (96.5 kg), SpO2 97%. Body mass index is 28.86 kg/m.    Physical Exam Vitals reviewed.  Constitutional:      Appearance: He is well-developed.     Comments: Pleasant.  Cooperative.  HENT:     Head: Normocephalic and atraumatic.     Right Ear: Tympanic membrane, ear canal and external ear normal. No drainage, swelling or tenderness. Tympanic membrane is not injected, scarred, erythematous, retracted or bulging.     Left Ear: Tympanic membrane, ear canal and external ear normal. No drainage, swelling or tenderness. Tympanic membrane is not injected, scarred, erythematous, retracted or bulging.     Ears:     Comments: Nasal sounding voice.    Nose: No nasal deformity, septal deviation, mucosal edema or rhinorrhea.     Right Turbinates: Enlarged, swollen and pale.     Left Turbinates: Enlarged, swollen and pale.     Right Sinus: Maxillary sinus tenderness present. No frontal sinus tenderness.     Left Sinus: Maxillary sinus tenderness present. No frontal sinus tenderness.     Comments: Positive for upper tooth pain.    Mouth/Throat:     Mouth: Mucous membranes are not pale and not dry.     Pharynx: Uvula midline.  Eyes:     General:        Right eye: No discharge.        Left eye: No discharge.     Conjunctiva/sclera: Conjunctivae normal.     Right eye: Right conjunctiva is not injected. No chemosis.    Left eye: Left conjunctiva is not injected. No chemosis.    Pupils: Pupils are equal, round, and reactive to light.  Cardiovascular:     Rate and Rhythm: Normal rate and regular rhythm.     Heart sounds: Normal heart sounds.  Pulmonary:     Effort: Pulmonary effort is normal. No tachypnea, accessory muscle usage or respiratory distress.     Breath sounds: Normal breath  sounds. No wheezing, rhonchi or rales.  Chest:     Chest wall: No tenderness.  Abdominal:     Tenderness: There is no abdominal tenderness. There is no guarding or rebound.  Lymphadenopathy:     Head:     Right side of head: No submandibular, tonsillar or occipital adenopathy.     Left side of head: No submandibular, tonsillar or occipital adenopathy.     Cervical: No cervical adenopathy.  Skin:    Coloration: Skin is not pale.     Findings: No abrasion, erythema, petechiae or rash. Rash is not papular, urticarial or vesicular.  Neurological:     Mental Status: He is alert.  Psychiatric:        Behavior: Behavior is cooperative.      Diagnostic studies: COVID testing was negative     Malachi Bonds, MD  [facility]

## 2022-12-09 NOTE — Telephone Encounter (Signed)
Patient called stating he can't swallow big pills and would like a prescription for a liquid antibiotic. Patient states to send to [facility] pharmacy on [address]

## 2022-12-09 NOTE — Patient Instructions (Addendum)
1. Seasonal and perennial allergic rhinitis (grasses and dust mites) - with allergic sinusitis today - COVID testing was negative. - We are going to start Augmentin twice daily for seven days to treat this.  - Continue with: Astelin one spray per nostril up to twice daily (AIM FOR EAR ON EACH SIDE) - Continue with: Flonase (fluticasone) one spray per nostril TWICE daily (AIM FOR EAR ON EACH SIDE) - You can use an extra dose of the antihistamine, if needed, for breakthrough symptoms.  - Consider nasal saline rinses 1-2 times daily to remove allergens from the nasal cavities as well as help with mucous clearance (this is especially helpful to do before the nasal sprays are given) - Continue with allergy shots at the same schedule.  - We will continue allergy shots through January 2027 at least (up to January 2029).   2. GERD - Continue with pantoprazole 1-2 times daily.   3. Return in about 6 months (around 06/11/2023).    Please inform us of any Emergency Department visits, hospitalizations, or changes in symptoms. Call us before going to the ED for breathing or allergy symptoms since we might be able to fit you in for a sick visit. Feel free to contact us anytime with any questions, problems, or concerns.  It was a pleasure to see you again today!  Websites that have reliable patient information: 1. American Academy of Asthma, Allergy, and Immunology: www.aaaai.org 2. Food Allergy Research and Education (FARE): foodallergy.org 3. Mothers of Asthmatics: http://www.asthmacommunitynetwork.org 4. American College of Allergy, Asthma, and Immunology: www.acaai.org   COVID-19 Vaccine Information can be found at: [email or web address] For questions related to vaccine distribution or appointments, please email [email or web address] or call [phone].   We realize that you might be concerned about having an allergic reaction to the COVID19  vaccines. To help with that concern, WE ARE OFFERING THE COVID19 VACCINES IN OUR OFFICE! Ask the front desk for dates!     "Like" [place] on Facebook and Instagram for our latest updates!      A healthy democracy works best when [employer] participate! Make sure you are registered to vote! If you have moved or changed any of your contact information, you will need to get this updated before voting!  In some cases, you MAY be able to register to vote online: [email or web address]

## 2022-12-09 NOTE — Telephone Encounter (Signed)
Please refer to previous telephone encounter - duplicate.

## 2022-12-09 NOTE — Telephone Encounter (Signed)
Patient called and stated he was prescribed the medication amoxicillin-clavulanate amoxicillin-clavulanate (AUGMENTIN) 875-125 MG tablet   and he wanted to know if he could get it in liquid form.

## 2022-12-13 ENCOUNTER — Ambulatory Visit (INDEPENDENT_AMBULATORY_CARE_PROVIDER_SITE_OTHER): Payer: No Typology Code available for payment source

## 2022-12-13 DIAGNOSIS — J309 Allergic rhinitis, unspecified: Secondary | ICD-10-CM

## 2022-12-13 MED ORDER — AMOXICILLIN-POT CLAVULANATE 600-42.9 MG/5ML PO SUSR: 30.0000 mg/kg/d | Freq: Two times a day (BID) | ORAL | 0 refills | Status: AC

## 2022-12-13 NOTE — Addendum Note (Signed)
Addended by: Alfonse Spruce on: 12/13/2022 11:13 AM   Modules accepted: Orders

## 2022-12-13 NOTE — Telephone Encounter (Signed)
Called patient and informed. Patient verbalized understanding and stated that he did pick it up.

## 2022-12-13 NOTE — Telephone Encounter (Signed)
I sent it in 

## 2022-12-20 ENCOUNTER — Ambulatory Visit (INDEPENDENT_AMBULATORY_CARE_PROVIDER_SITE_OTHER): Payer: No Typology Code available for payment source

## 2022-12-20 DIAGNOSIS — J309 Allergic rhinitis, unspecified: Secondary | ICD-10-CM

## 2022-12-29 NOTE — Progress Notes (Deleted)
Name: William Hicks   MRN: [id number]    DOB: [birth year]   Date:12/29/2022       Progress Note  Subjective  Chief Complaint  No chief complaint on file.   HPI  Patient presents for annual CPE.  IPSS Questionnaire (AUA-7): Over the past month.   1)  How often have you had a sensation of not emptying your bladder completely after you finish urinating?  {Rating:19227}  2)  How often have you had to urinate again less than two hours after you finished urinating? {Rating:19227}  3)  How often have you found you stopped and started again several times when you urinated?  {Rating:19227}  4) How difficult have you found it to postpone urination?  {Rating:19227}  5) How often have you had a weak urinary stream?  {Rating:19227}  6) How often have you had to push or strain to begin urination?  {Rating:19227}  7) How many times did you most typically get up to urinate from the time you went to bed until the time you got up in the morning?  {Rating:19228}  Total score:  0-7 mildly symptomatic   8-19 moderately symptomatic   20-35 severely symptomatic     Diet: *** Exercise: *** Last Dental Exam: **** Last Eye Exam: ***  Depression: phq 9 is {gen pos [id number]:[id number]}    06/01/2022   12:51 PM 05/21/2022    8:02 AM 02/18/2022   11:05 AM 10/07/2021    2:20 PM  Depression screen PHQ 2/9  Decreased Interest 0 0 2 0  Down, Depressed, Hopeless 0 0 3 0  PHQ - 2 Score 0 0 5 0  Altered sleeping 0 0 3 0  Tired, decreased energy 0 0 3 0  Change in appetite 0 0 1 0  Feeling bad or failure about yourself  0 0 2 0  Trouble concentrating 0 0 3 0  Moving slowly or fidgety/restless 0 0 1 0  Suicidal thoughts 0 0 0 0  PHQ-9 Score 0 0 18 0  Difficult doing work/chores Not difficult at all  Somewhat difficult Not difficult at all    Hypertension:  BP Readings from Last 3 Encounters:  12/09/22 120/80  09/21/22 126/80  06/01/22 120/64    Obesity: Wt Readings from Last 3 Encounters:  12/09/22  212 lb 12.8 oz (96.5 kg)  09/21/22 222 lb 4.8 oz (100.8 kg)  06/01/22 220 lb (99.8 kg)   BMI Readings from Last 3 Encounters:  12/09/22 28.86 kg/m  09/21/22 30.15 kg/m  06/01/22 29.03 kg/m     Lipids:  Lab Results  Component Value Date   CHOL 161 06/16/2021   CHOL 256 (H) 09/10/2020   Lab Results  Component Value Date   HDL 55 06/16/2021   HDL 78 09/10/2020   Lab Results  Component Value Date   LDLCALC 76 06/16/2021   LDLCALC 154 (H) 09/10/2020   Lab Results  Component Value Date   TRIG 177 (H) 06/16/2021   TRIG 120 09/10/2020   Lab Results  Component Value Date   CHOLHDL 2.9 06/16/2021   CHOLHDL 3.3 09/10/2020   No results found for: "LDLDIRECT" Glucose:  Glucose, Bld  Date Value Ref Range Status  07/10/2021 95 70 - 99 mg/dL Final    Comment:    Glucose reference range applies only to samples taken after fasting for at least 8 hours.  09/13/2020 122 (H) 70 - 99 mg/dL Final    Comment:    Glucose reference range applies  only to samples taken after fasting for at least 8 hours.  09/12/2020 153 (H) 70 - 99 mg/dL Final    Comment:    Glucose reference range applies only to samples taken after fasting for at least 8 hours.   Glucose-Capillary  Date Value Ref Range Status  09/10/2020 109 (H) 70 - 99 mg/dL Final    Comment:    Glucose reference range applies only to samples taken after fasting for at least 8 hours.     ***  Married STD testing and prevention (HIV/chl/gon/syphilis):  {yes/no/default n/a:21102::"not applicable"} Sexual history:  Hep C Screening: ordered Skin cancer: Discussed monitoring for atypical lesions Colorectal cancer: due/ordered cologuard Prostate cancer:  no, PSA ordered No results found for: "PSA"   Lung cancer:  Low Dose CT Chest recommended if Age 11-80 years, 30 pack-year currently smoking OR have quit w/in 15years. Patient  {Response; is/is not/na:63} a candidate for screening   AAA: The USPSTF recommends one-time  screening with ultrasonography in men ages 45 to 75 years who have ever smoked. Patient   {Response; is /is not/na:63}, a candidate for screening  ECG:  last 09/11/20  Vaccines:   HPV: N/A Tdap: 10/13/21 Shingrix:  Pneumonia: due/ordered Flu:  COVID-19:N/A  Advanced Care Planning: A voluntary discussion about advance care planning including the explanation and discussion of advance directives.  Discussed health care proxy and Living will, and the patient was able to identify a health care proxy as ***.  Patient {DOES_DOES ACZ:66063} have a living will and power of attorney of health care   Patient Active Problem List   Diagnosis Date Noted   Anxiety disorder 02/18/2022   History of malignant neoplasm of prostate 10/07/2021   History of CVA (cerebrovascular accident) without residual deficits 10/07/2021   Mild intermittent asthma without complication 10/07/2021   Prediabetes 10/07/2021   Environmental allergies 10/07/2021   Anticoagulated by anticoagulation treatment 09/16/2020   CVA (cerebral vascular accident) (HCC) 09/09/2020   Cluster headache 09/09/2020   Acute CVA (cerebrovascular accident) (HCC) 09/09/2020   Malignant neoplasm of prostate (HCC) 05/06/2015   Degenerative arthritis of hip 11/14/2013   HLD (hyperlipidemia) 09/25/2012   GERD (gastroesophageal reflux disease) 08/17/2011   Abscess, gluteal, right 12/22/2010   Abscess, gluteal 12/18/2010   HEARTBURN 10/30/2008   PERSONAL HISTORY OF COLONIC POLYPS 10/30/2008    Past Surgical History:  Procedure Laterality Date   LITHOTRIPSY  2008    Family History  Problem Relation Age of Onset   Heart failure Mother    Alzheimer's disease Father     Social History   Socioeconomic History   Marital status: Married    Spouse name: Not on file   Number of children: Not on file   Years of education: Not on file   Highest education level: Not on file  Occupational History   Not on file  Tobacco Use   Smoking status:  Former   Smokeless tobacco: Never  Vaping Use   Vaping status: Never Used  Substance and Sexual Activity   Alcohol use: Yes    Comment: 2-3 week beer   Drug use: No   Sexual activity: Not Currently  Other Topics Concern   Not on file  Social History Narrative   Not on file   Social Determinants of Health   Financial Resource Strain: Not on file  Food Insecurity: No Food Insecurity (11/12/2020)   Received from [facility], [facility]   Hunger Vital Sign    Worried About Running Out  of Food in the Last Year: Never true    Ran Out of Food in the Last Year: Never true  Transportation Needs: Not on file  Physical Activity: Not on file  Stress: Not on file  Social Connections: Unknown (09/29/2021)   Received from [facility], [facility]   Social Network    Social Network: Not on file  Intimate Partner Violence: Unknown (08/26/2021)   Received from [facility], [facility]   HITS    Physically Hurt: Not on file    Insult or Talk Down To: Not on file    Threaten Physical Harm: Not on file    Scream or Curse: Not on file     Current Outpatient Medications:    albuterol (VENTOLIN HFA) 108 (90 Base) MCG/ACT inhaler, Inhale into the lungs., Disp: , Rfl:    atorvastatin (LIPITOR) 40 MG tablet, TAKE 2 TABLETS(80 MG) BY MOUTH AT BEDTIME, Disp: 180 tablet, Rfl: 0   azelastine (ASTELIN) 0.1 % nasal spray, Place 1 spray into both nostrils 2 (two) times daily., Disp: 30 mL, Rfl: 5   Baclofen 5 MG TABS, Take 1 tablet by mouth as needed., Disp: , Rfl:    busPIRone (BUSPAR) 5 MG tablet, Take 1-3 tablets (5-15 mg total) by mouth 3 (three) times daily as needed (anxiety)., Disp: 60 tablet, Rfl: 1   Cholecalciferol 25 MCG (1000 UT) tablet, Take 1 tablet by mouth daily., Disp: , Rfl:    clonazePAM (KLONOPIN) 0.5 MG tablet, May take 0.5- 1 mg po prn for severe anxiety/panic attack symptoms up to once a day, do not take daily, do not take with pain meds or other sedating meds (Patient not  taking: Reported on 12/09/2022), Disp: 10 tablet, Rfl: 0   clopidogrel (PLAVIX) 75 MG tablet, Take 1 tablet (75 mg total) by mouth daily., Disp: 90 tablet, Rfl: 0   cyanocobalamin 1000 MCG tablet, Take 1 tablet by mouth daily., Disp: , Rfl:    diazepam (VALIUM) 5 MG tablet, as needed. (Patient not taking: Reported on 12/09/2022), Disp: , Rfl:    docusate sodium (COLACE) 50 MG capsule, Take 50 mg by mouth daily., Disp: , Rfl:    EPINEPHrine (EPIPEN 2-PAK) 0.3 mg/0.3 mL IJ SOAJ injection, Inject 0.3 mg into the muscle as needed for anaphylaxis., Disp: 0.3 mL, Rfl: 1   fluticasone (FLONASE) 50 MCG/ACT nasal spray, Place into the nose., Disp: , Rfl:    fluticasone (FLONASE) 50 MCG/ACT nasal spray, Place 1 spray into both nostrils in the morning and at bedtime., Disp: 16 g, Rfl: 5   hydrOXYzine (VISTARIL) 25 MG capsule, Take 1 capsule (25 mg total) by mouth every 8 (eight) hours as needed. (Patient not taking: Reported on 12/09/2022), Disp: 60 capsule, Rfl: 1   leuprolide, 6 Month, (LEUPROLIDE ACETATE, 6 MONTH,) 45 MG injection, Inject into the skin., Disp: , Rfl:    methylPREDNISolone (MEDROL DOSEPAK) 4 MG TBPK tablet, .med, Disp: 1 each, Rfl: 0   pantoprazole (PROTONIX) 40 MG tablet, TAKE ONE TABLET BY MOUTH EVERY DAY TO CONTROL STOMACH ACID, Disp: , Rfl:    pantoprazole (PROTONIX) 40 MG tablet, Take 40mg  1-2 times daily., Disp: 60 tablet, Rfl: 5   polyethylene glycol powder (GLYCOLAX/MIRALAX) 17 GM/SCOOP powder, MIX 17GM (1 CAPFUL) BY MOUTH EVERY DAY AS NEEDED MIX 17 GRAMS (USE BOTTLE CAP) IN LIQUID OF CHOICE AS DIRECTED (Patient not taking: Reported on 12/09/2022), Disp: , Rfl:    predniSONE (DELTASONE) 10 MG tablet, Take two tablets (20mg ) twice daily for  three days, then one tablet (10mg ) twice daily for three days, then STOP. (Patient not taking: Reported on 12/09/2022), Disp: 18 tablet, Rfl: 0   sucralfate (CARAFATE) 1 g tablet, Take 1 g by mouth daily with breakfast., Disp: , Rfl:    traMADol (ULTRAM)  50 MG tablet, Take 50 mg by mouth 2 (two) times daily as needed., Disp: , Rfl:   Allergies  Allergen Reactions   Pregabalin Other (See Comments) and Rash   Aspirin Diarrhea and Nausea And Vomiting     ROS  ***   Objective  There were no vitals filed for this visit.  There is no height or weight on file to calculate BMI.  Physical Exam ***  No results found for this or any previous visit (from the past 2160 hour(s)).   Fall Risk:    06/01/2022   12:51 PM 05/21/2022    8:01 AM 02/18/2022   11:04 AM 10/07/2021    2:20 PM  Fall Risk   Falls in the past year? 0 0 0 0  Number falls in past yr: 0 0 0 0  Injury with Fall? 0 0 0 0  Risk for fall due to :  No Fall Risks No Fall Risks   Follow up  Falls prevention discussed Falls prevention discussed;Education provided      Functional Status Survey:      Assessment & Plan  There are no diagnoses linked to this encounter.   -Prostate cancer screening and PSA options (with potential risks and benefits of testing vs not testing) were discussed along with recent recs/guidelines. -USPSTF grade A and B recommendations reviewed with patient; age-appropriate recommendations, preventive care, screening tests, etc discussed and encouraged; healthy living encouraged; see AVS for patient education given to patient -Discussed importance of 150 minutes of physical activity weekly, eat two servings of fish weekly, eat one serving of tree nuts ( cashews, pistachios, pecans, almonds.Marland Kitchen) every other day, eat 6 servings of fruit/vegetables daily and drink plenty of water and avoid sweet beverages.  -Reviewed Health Maintenance: {yes/no/default n/a:21102::"not applicable"}

## 2022-12-30 ENCOUNTER — Encounter: Payer: PPO | Admitting: Internal Medicine

## 2023-01-03 ENCOUNTER — Ambulatory Visit (INDEPENDENT_AMBULATORY_CARE_PROVIDER_SITE_OTHER): Payer: No Typology Code available for payment source

## 2023-01-03 DIAGNOSIS — J309 Allergic rhinitis, unspecified: Secondary | ICD-10-CM

## 2023-01-17 ENCOUNTER — Ambulatory Visit (INDEPENDENT_AMBULATORY_CARE_PROVIDER_SITE_OTHER): Payer: No Typology Code available for payment source | Admitting: *Deleted

## 2023-01-17 DIAGNOSIS — J309 Allergic rhinitis, unspecified: Secondary | ICD-10-CM | POA: Diagnosis not present

## 2023-01-18 NOTE — Progress Notes (Signed)
Name: William Hicks   MRN: [id number]    DOB: December 25, 1950   Date:01/27/2023       Progress Note  Subjective  Chief Complaint  Chief Complaint  Patient presents with   Annual Exam    HPI  Patient presents for annual CPE.  IPSS Questionnaire (AUA-7): Over the past month.   1)  How often have you had a sensation of not emptying your bladder completely after you finish urinating?  0 - Not at all  2)  How often have you had to urinate again less than two hours after you finished urinating? 0 - Not at all  3)  How often have you found you stopped and started again several times when you urinated?  0 - Not at all  4) How difficult have you found it to postpone urination?  0 - Not at all  5) How often have you had a weak urinary stream?  0 - Not at all  6) How often have you had to push or strain to begin urination?  0 - Not at all  7) How many times did you most typically get up to urinate from the time you went to bed until the time you got up in the morning?  1 - 1 time  Total score:  0-7 mildly symptomatic   8-19 moderately symptomatic   20-35 severely symptomatic     Diet: Breakfast: oatmeal, bananas. 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 Exercise: 3 times a week - gym, lifting weights and walking  Last Dental Exam: UTD Last Eye Exam: UTD  Depression: phq 9 is negative    01/27/2023    3:04 PM 06/01/2022   12:51 PM 05/21/2022    8:02 AM 02/18/2022   11:05 AM 10/07/2021    2:20 PM  Depression screen PHQ 2/9  Decreased Interest 0 0 0 2 0  Down, Depressed, Hopeless 0 0 0 3 0  PHQ - 2 Score 0 0 0 5 0  Altered sleeping 0 0 0 3 0  Tired, decreased energy 0 0 0 3 0  Change in appetite 0 0 0 1 0  Feeling bad or failure about yourself  0 0 0 2 0  Trouble concentrating 0 0 0 3 0  Moving slowly or fidgety/restless 0 0 0 1 0  Suicidal thoughts 0 0 0 0 0  PHQ-9 Score 0 0 0 18 0  Difficult doing work/chores Not difficult at all Not difficult at all  Somewhat difficult Not difficult at all     Hypertension:  BP Readings from Last 3 Encounters:  01/27/23 122/74  12/09/22 120/80  09/21/22 126/80    Obesity: Wt Readings from Last 3 Encounters:  01/27/23 211 lb 12.8 oz (96.1 kg)  12/09/22 212 lb 12.8 oz (96.5 kg)  09/21/22 222 lb 4.8 oz (100.8 kg)   BMI Readings from Last 3 Encounters:  01/27/23 27.94 kg/m  12/09/22 28.86 kg/m  09/21/22 30.15 kg/m     Lipids:  Lab Results  Component Value Date   CHOL 161 06/16/2021   CHOL 256 (H) 09/10/2020   Lab Results  Component Value Date   HDL 55 06/16/2021   HDL 78 09/10/2020   Lab Results  Component Value Date   LDLCALC 76 06/16/2021   LDLCALC 154 (H) 09/10/2020   Lab Results  Component Value Date   TRIG 177 (H) 06/16/2021   TRIG 120 09/10/2020   Lab Results  Component Value Date   CHOLHDL 2.9 06/16/2021   CHOLHDL  3.3 09/10/2020   No results found for: "LDLDIRECT" Glucose:  Glucose, Bld  Date Value Ref Range Status  07/10/2021 95 70 - 99 mg/dL Final    Comment:    Glucose reference range applies only to samples taken after fasting for at least 8 hours.  09/13/2020 122 (H) 70 - 99 mg/dL Final    Comment:    Glucose reference range applies only to samples taken after fasting for at least 8 hours.  09/12/2020 153 (H) 70 - 99 mg/dL Final    Comment:    Glucose reference range applies only to samples taken after fasting for at least 8 hours.   Glucose-Capillary  Date Value Ref Range Status  09/10/2020 109 (H) 70 - 99 mg/dL Final    Comment:    Glucose reference range applies only to samples taken after fasting for at least 8 hours.     Married STD testing and prevention (HIV/chl/gon/syphilis):  yes hiv: due/ordered Hep C Screening: due/ordered Skin cancer: Discussed monitoring for atypical lesions Colorectal cancer: had colonoscopy at [facility] in [place] earlier this year, obtaining records. Due to repeat in 5 years per patient.  Prostate cancer:  yes No results found for:  "PSA"   Lung cancer:  Low Dose CT Chest recommended if Age 28-80 years, 30 pack-year currently smoking OR have quit w/in 15years. Patient  no a candidate for screening   AAA: The USPSTF recommends one-time screening with ultrasonography in men ages 65 to 75 years who have ever smoked. Patient   not applicable, a candidate for screening - had screening last year at [facility] and at the [place] in the past.  ECG:  09/11/20  Vaccines:   HPV: n/a Tdap: 10/13/21 Shingrix: discussed obtaining at pharmacy Pneumonia: Prevnar 20 due, patient wanting to check records at [facility] to see if he's already had it Flu: due, patient wanting to wait until October to get it COVID-19:uncertain   Advanced Care Planning: A voluntary discussion about advance care planning including the explanation and discussion of advance directives.  Discussed health care proxy and Living will, and the patient was able to identify a health care proxy as daughter Quay Marsack.  Patient does not have a living will and power of attorney of health care   Patient Active Problem List   Diagnosis Date Noted   Anxiety disorder 02/18/2022   History of malignant neoplasm of prostate 10/07/2021   History of CVA (cerebrovascular accident) without residual deficits 10/07/2021   Mild intermittent asthma without complication 10/07/2021   Prediabetes 10/07/2021   Environmental allergies 10/07/2021   Anticoagulated by anticoagulation treatment 09/16/2020   CVA (cerebral vascular accident) (HCC) 09/09/2020   Cluster headache 09/09/2020   Acute CVA (cerebrovascular accident) (HCC) 09/09/2020   Malignant neoplasm of prostate (HCC) 05/06/2015   Degenerative arthritis of hip 11/14/2013   HLD (hyperlipidemia) 09/25/2012   GERD (gastroesophageal reflux disease) 08/17/2011   Abscess, gluteal, right 12/22/2010   Abscess, gluteal 12/18/2010   HEARTBURN 10/30/2008   PERSONAL HISTORY OF COLONIC POLYPS 10/30/2008    Past Surgical History:  Procedure  Laterality Date   LITHOTRIPSY  2008    Family History  Problem Relation Age of Onset   Heart failure Mother    Alzheimer's disease Father     Social History   Socioeconomic History   Marital status: Married    Spouse name: Not on file   Number of children: Not on file   Years of education: Not on file   Highest  education level: Not on file  Occupational History   Not on file  Tobacco Use   Smoking status: Former   Smokeless tobacco: Never  Vaping Use   Vaping status: Never Used  Substance and Sexual Activity   Alcohol use: Yes    Comment: 2-3 week beer   Drug use: No   Sexual activity: Not Currently  Other Topics Concern   Not on file  Social History Narrative   Not on file   Social Determinants of Health   Financial Resource Strain: Not on file  Food Insecurity: No Food Insecurity (11/12/2020)   Received from [facility], [facility]   Hunger Vital Sign    Worried About Running Out of Food in the Last Year: Never true    Ran Out of Food in the Last Year: Never true  Transportation Needs: Not on file  Physical Activity: Not on file  Stress: Not on file  Social Connections: Unknown (09/29/2021)   Received from [facility], [facility]   Social Network    Social Network: Not on file  Intimate Partner Violence: Unknown (08/26/2021)   Received from [facility], [facility]   HITS    Physically Hurt: Not on file    Insult or Talk Down To: Not on file    Threaten Physical Harm: Not on file    Scream or Curse: Not on file     Current Outpatient Medications:    albuterol (VENTOLIN HFA) 108 (90 Base) MCG/ACT inhaler, Inhale into the lungs., Disp: , Rfl:    atorvastatin (LIPITOR) 40 MG tablet, TAKE 2 TABLETS(80 MG) BY MOUTH AT BEDTIME, Disp: 180 tablet, Rfl: 0   azelastine (ASTELIN) 0.1 % nasal spray, Place 1 spray into both nostrils 2 (two) times daily., Disp: 30 mL, Rfl: 5   clopidogrel (PLAVIX) 75 MG tablet, Take 1 tablet (75 mg total) by mouth daily.,  Disp: 90 tablet, Rfl: 0   docusate sodium (COLACE) 50 MG capsule, Take 50 mg by mouth daily., Disp: , Rfl:    fluticasone (FLONASE) 50 MCG/ACT nasal spray, Place 1 spray into both nostrils in the morning and at bedtime., Disp: 16 g, Rfl: 5   leuprolide, 6 Month, (LEUPROLIDE ACETATE, 6 MONTH,) 45 MG injection, Inject into the skin., Disp: , Rfl:    pantoprazole (PROTONIX) 40 MG tablet, Take 40mg  1-2 times daily., Disp: 60 tablet, Rfl: 5   sucralfate (CARAFATE) 1 g tablet, Take 1 g by mouth daily with breakfast., Disp: , Rfl:    busPIRone (BUSPAR) 5 MG tablet, Take 1-3 tablets (5-15 mg total) by mouth 3 (three) times daily as needed (anxiety). (Patient not taking: Reported on 01/27/2023), Disp: 60 tablet, Rfl: 1   diazepam (VALIUM) 5 MG tablet, as needed. (Patient not taking: Reported on 12/09/2022), Disp: , Rfl:    EPINEPHrine (EPIPEN 2-PAK) 0.3 mg/0.3 mL IJ SOAJ injection, Inject 0.3 mg into the muscle as needed for anaphylaxis. (Patient not taking: Reported on 01/27/2023), Disp: 0.3 mL, Rfl: 1   fluticasone (FLONASE) 50 MCG/ACT nasal spray, Place into the nose. (Patient not taking: Reported on 01/27/2023), Disp: , Rfl:   Allergies  Allergen Reactions   Pregabalin Other (See Comments) and Rash   Aspirin Diarrhea and Nausea And Vomiting     Review of Systems  All other systems reviewed and are negative.      Objective  Vitals:   01/27/23 1459  BP: 122/74  Pulse: 89  Temp: 98 F (36.7 C)  SpO2: 98%  Weight:  211 lb 12.8 oz (96.1 kg)  Height: 6\' 1"  (1.854 m)    Body mass index is 27.94 kg/m.  Physical Exam Constitutional:      Appearance: Normal appearance.  HENT:     Head: Normocephalic and atraumatic.     Mouth/Throat:     Mouth: Mucous membranes are moist.     Pharynx: Oropharynx is clear.  Eyes:     Extraocular Movements: Extraocular movements intact.     Conjunctiva/sclera: Conjunctivae normal.     Pupils: Pupils are equal, round, and reactive to light.  Neck:      Vascular: No carotid bruit.     Comments: Mildly enlarged thyroid gland on the left Cardiovascular:     Rate and Rhythm: Normal rate and regular rhythm.  Pulmonary:     Effort: Pulmonary effort is normal.     Breath sounds: Normal breath sounds.  Musculoskeletal:     Cervical back: No tenderness.     Right lower leg: No edema.     Left lower leg: No edema.  Lymphadenopathy:     Cervical: No cervical adenopathy.  Skin:    General: Skin is warm and dry.  Neurological:     General: No focal deficit present.     Mental Status: He is alert. Mental status is at baseline.  Psychiatric:        Mood and Affect: Mood normal.        Behavior: Behavior normal.     No results found for this or any previous visit (from the past 2160 hour(s)).   Fall Risk:    01/27/2023    3:04 PM 06/01/2022   12:51 PM 05/21/2022    8:01 AM 02/18/2022   11:04 AM 10/07/2021    2:20 PM  Fall Risk   Falls in the past year? 0 0 0 0 0  Number falls in past yr: 0 0 0 0 0  Injury with Fall? 0 0 0 0 0  Risk for fall due to :   No Fall Risks No Fall Risks   Follow up   Falls prevention discussed Falls prevention discussed;Education provided      Functional Status Survey: Is the patient deaf or have difficulty hearing?: No Does the patient have difficulty seeing, even when wearing glasses/contacts?: No Does the patient have difficulty concentrating, remembering, or making decisions?: No Does the patient have difficulty walking or climbing stairs?: No Does the patient have difficulty dressing or bathing?: No Does the patient have difficulty doing errands alone such as visiting a doctor's office or shopping?: No    Assessment & Plan  1. Annual physical exam: Physical complete, labs ordered.   - PSA - HIV antibody (with reflex) - Hepatitis C Antibody - CBC w/Diff/Platelet - COMPLETE METABOLIC PANEL WITH GFR - Lipid Profile  2. Enlarged thyroid gland: Add TSH to labs.  - TSH  3. Mixed  hyperlipidemia: Recheck lipid panel, refill statin.  - Lipid Profile - atorvastatin (LIPITOR) 40 MG tablet; Take 1 tablet (40 mg total) by mouth daily.  Dispense: 90 tablet; Refill: 3  4. Screening for prostate cancer/Screening for HIV without presence of risk factors/Encounter for hepatitis C screening test for low risk patient: Screening labs ordered.   - PSA - HIV antibody (with reflex) - Hepatitis C Antibody   -Prostate cancer screening and PSA options (with potential risks and benefits of testing vs not testing) were discussed along with recent recs/guidelines. -USPSTF grade A and B recommendations reviewed with patient; age-appropriate  recommendations, preventive care, screening tests, etc discussed and encouraged; healthy living encouraged; see AVS for patient education given to patient -Discussed importance of 150 minutes of physical activity weekly, eat two servings of fish weekly, eat one serving of tree nuts ( cashews, pistachios, pecans, almonds.Marland Kitchen) every other day, eat 6 servings of fruit/vegetables daily and drink plenty of water and avoid sweet beverages.  -Reviewed Health Maintenance: yes

## 2023-01-27 ENCOUNTER — Encounter: Payer: Self-pay | Admitting: Internal Medicine

## 2023-01-27 ENCOUNTER — Ambulatory Visit (INDEPENDENT_AMBULATORY_CARE_PROVIDER_SITE_OTHER): Payer: PPO | Admitting: Internal Medicine

## 2023-01-27 VITALS — BP 122/74 | HR 89 | Temp 98.0°F | Ht 73.0 in | Wt 211.8 lb

## 2023-01-27 DIAGNOSIS — Z114 Encounter for screening for human immunodeficiency virus [HIV]: Secondary | ICD-10-CM

## 2023-01-27 DIAGNOSIS — Z23 Encounter for immunization: Secondary | ICD-10-CM

## 2023-01-27 DIAGNOSIS — E782 Mixed hyperlipidemia: Secondary | ICD-10-CM

## 2023-01-27 DIAGNOSIS — E049 Nontoxic goiter, unspecified: Secondary | ICD-10-CM | POA: Diagnosis not present

## 2023-01-27 DIAGNOSIS — Z1211 Encounter for screening for malignant neoplasm of colon: Secondary | ICD-10-CM

## 2023-01-27 DIAGNOSIS — Z1159 Encounter for screening for other viral diseases: Secondary | ICD-10-CM

## 2023-01-27 DIAGNOSIS — Z125 Encounter for screening for malignant neoplasm of prostate: Secondary | ICD-10-CM | POA: Diagnosis not present

## 2023-01-27 DIAGNOSIS — Z0000 Encounter for general adult medical examination without abnormal findings: Secondary | ICD-10-CM | POA: Diagnosis not present

## 2023-01-27 MED ORDER — ATORVASTATIN CALCIUM 40 MG PO TABS
40.0000 mg | ORAL_TABLET | Freq: Every day | ORAL | 3 refills | Status: DC
Start: 2023-01-27 — End: 2024-03-07

## 2023-01-28 LAB — CBC WITH DIFFERENTIAL/PLATELET
Absolute Monocytes: 386 {cells}/uL (ref 200–950)
Basophils Absolute: 21 {cells}/uL (ref 0–200)
Basophils Relative: 0.5 %
Eosinophils Absolute: 227 {cells}/uL (ref 15–500)
Eosinophils Relative: 5.4 %
HCT: 37.1 % — ABNORMAL LOW (ref 38.5–50.0)
Hemoglobin: 12.1 g/dL — ABNORMAL LOW (ref 13.2–17.1)
Lymphs Abs: 962 {cells}/uL (ref 850–3900)
MCH: 24.2 pg — ABNORMAL LOW (ref 27.0–33.0)
MCHC: 32.6 g/dL (ref 32.0–36.0)
MCV: 74.3 fL — ABNORMAL LOW (ref 80.0–100.0)
MPV: 10.4 fL (ref 7.5–12.5)
Monocytes Relative: 9.2 %
Neutro Abs: 2604 {cells}/uL (ref 1500–7800)
Neutrophils Relative %: 62 %
Platelets: 166 10*3/uL (ref 140–400)
RBC: 4.99 10*6/uL (ref 4.20–5.80)
RDW: 13.5 % (ref 11.0–15.0)
Total Lymphocyte: 22.9 %
WBC: 4.2 10*3/uL (ref 3.8–10.8)

## 2023-01-28 LAB — TSH: TSH: 1.93 mIU/L (ref 0.40–4.50)

## 2023-01-28 LAB — COMPLETE METABOLIC PANEL WITH GFR
AG Ratio: 1.7 (calc) (ref 1.0–2.5)
ALT: 24 U/L (ref 9–46)
AST: 24 U/L (ref 10–35)
Albumin: 4 g/dL (ref 3.6–5.1)
Alkaline phosphatase (APISO): 88 U/L (ref 35–144)
BUN: 15 mg/dL (ref 7–25)
CO2: 30 mmol/L (ref 20–32)
Calcium: 9.2 mg/dL (ref 8.6–10.3)
Chloride: 105 mmol/L (ref 98–110)
Creat: 1.23 mg/dL (ref 0.70–1.35)
Globulin: 2.3 g/dL (ref 1.9–3.7)
Glucose, Bld: 96 mg/dL (ref 65–99)
Potassium: 4.1 mmol/L (ref 3.5–5.3)
Sodium: 142 mmol/L (ref 135–146)
Total Bilirubin: 0.5 mg/dL (ref 0.2–1.2)
Total Protein: 6.3 g/dL (ref 6.1–8.1)
eGFR: 64 mL/min/{1.73_m2} (ref >=60)

## 2023-01-28 LAB — LIPID PANEL
Cholesterol: 166 mg/dL (ref <200)
HDL: 55 mg/dL (ref >=40)
LDL Cholesterol (Calc): 78 mg/dL
Non-HDL Cholesterol (Calc): 111 mg/dL (ref <130)
Total CHOL/HDL Ratio: 3 (calc) (ref <5.0)
Triglycerides: 237 mg/dL — ABNORMAL HIGH (ref <150)

## 2023-01-28 LAB — PSA: PSA: 0.14 ng/mL (ref <=4.00)

## 2023-01-28 LAB — HIV ANTIBODY (ROUTINE TESTING W REFLEX): HIV 1&2 Ab, 4th Generation: NONREACTIVE

## 2023-01-28 LAB — HEPATITIS C ANTIBODY: Hepatitis C Ab: NONREACTIVE

## 2023-01-31 ENCOUNTER — Ambulatory Visit (INDEPENDENT_AMBULATORY_CARE_PROVIDER_SITE_OTHER): Payer: No Typology Code available for payment source

## 2023-01-31 ENCOUNTER — Other Ambulatory Visit: Payer: Self-pay | Admitting: Internal Medicine

## 2023-01-31 DIAGNOSIS — E781 Pure hyperglyceridemia: Secondary | ICD-10-CM

## 2023-01-31 DIAGNOSIS — J309 Allergic rhinitis, unspecified: Secondary | ICD-10-CM | POA: Diagnosis not present

## 2023-01-31 MED ORDER — FENOFIBRATE 48 MG PO TABS: 48.0000 mg | ORAL_TABLET | Freq: Every day | ORAL | 1 refills | Status: DC

## 2023-02-03 ENCOUNTER — Ambulatory Visit: Payer: Self-pay

## 2023-02-03 ENCOUNTER — Telehealth: Payer: Self-pay | Admitting: Internal Medicine

## 2023-02-03 NOTE — Telephone Encounter (Signed)
Chief Complaint: Medication question regarding atorvastatin and fenofibrate Disposition: [] ED /[] Urgent Care (no appt availability in office) / [] Appointment(In office/virtual)/ []  [facility]/ [] Home Care/ [] Refused Recommended Disposition /[] [place] [facility]/ [x]  Follow-up with PCP Additional Notes: Patient says he had been prescribed atorvastatin 40 mg 2 tabs daily. He says he stopped taking the 2 pills about 1 month ago because he figured it was taking too much of his cholesterol out because he didn't feel right. He says now that he had labs done and see it's no better, he wants to start back on the 2 tabs daily and not take the fenofibrate she sent in. I advised that the fenofibrate and atorvastatin 1 tab daily will help to lower the cholesterol. He says he understands but want to take 2 atorvastatin as he did in the past and not take the fenofibrate, then he will work on his diet and come back for labs to see if it's working. He says he will need a new Rx sent so there will be no problems at the pharmacy. Advised I will send this to Dr. Caralee Ates and someone will call back with her recommendation.    Summary: medication clarity   Patient called in regards to his atorvastatin (LIPITOR) 40 MG tablet. He stated he takes 2 tablets a day per his provider and his prescription is only wrote for 1 tablet a day.  Please advise. Patients callback # [phone]         Reason for Disposition  [1] Caller has NON-URGENT medicine question about med that PCP prescribed AND [2] triager unable to answer question  Answer Assessment - Initial Assessment Questions 1. NAME of MEDICINE: "What medicine(s) are you calling about?"     Atorvastatin 2. QUESTION: "What is your question?" (e.g., double dose of medicine, side effect)     I used to take 2 pills and the prescription picked up says 1 pill and I want to go back to the 2 pills 3. PRESCRIBER: "Who prescribed the medicine?" Reason: if  prescribed by specialist, call should be referred to that group.     Dr. Caralee Ates  Protocols used: Medication Question Call-A-AH

## 2023-02-03 NOTE — Telephone Encounter (Signed)
Copied from CRM [id number]. Topic: General - Other >> Feb 03, 2023  8:28 AM Dondra Prader A wrote: Reason for CRM: Pt is calling to see if he can come to the office to get a copy of his last lab work/results. Pt would like to come pick it up today if he can. Please advise.

## 2023-02-03 NOTE — Telephone Encounter (Signed)
Pt notified, left up front for pick up.

## 2023-02-08 ENCOUNTER — Telehealth: Payer: Self-pay | Admitting: Internal Medicine

## 2023-02-08 DIAGNOSIS — J3089 Other allergic rhinitis: Secondary | ICD-10-CM | POA: Diagnosis not present

## 2023-02-08 NOTE — Telephone Encounter (Signed)
Copied from CRM [id number]. Topic: General - Other >> Feb 08, 2023  2:19 PM Santiya F wrote: Reason for CRM: Pt is calling in returning a call from the office. Pt says there was no voicemail left so he's not sure who the call was from. Please follow up with pt.

## 2023-02-08 NOTE — Telephone Encounter (Signed)
Left detailed vm

## 2023-02-08 NOTE — Progress Notes (Signed)
VIAL EXP 02-08-24

## 2023-02-10 ENCOUNTER — Ambulatory Visit: Payer: Self-pay | Admitting: *Deleted

## 2023-02-10 NOTE — Telephone Encounter (Signed)
  Chief Complaint: medication question regarding lipitor and tricor medication. Should patient take medications at this same time or different times of the day? Patient concerned taking both medication at the same time Symptoms: na  Frequency: na Pertinent Negatives: Patient denies na Disposition: [] ED /[] Urgent Care (no appt availability in office) / [] Appointment(In office/virtual)/ []  [facility]/ [] Home Care/ [] Refused Recommended Disposition /[] [place]/ [x]  Follow-up with PCP Additional Notes:   In review of chart , 02/08/23 message from PCP states she does want patient to take lipitor and tricor due to elevated triglycerides but does not state if ok for patient to take at the same time of day. Please advise. Recommended to review with pharmacy as well.   Summary: How to take medication   Pt is calling in because he wants to know how to take atorvastatin (LIPITOR) 40 MG tablet [[id number]] fenofibrate (TRICOR) 48 MG tablet [[id number]]. Pt asks can he take them together and does he need to take it with food              Reason for Disposition  [1] Caller has URGENT medicine question about med that PCP or specialist prescribed AND [2] triager unable to answer question  Answer Assessment - Initial Assessment Questions 1. NAME of MEDICINE: "What medicine(s) are you calling about?"     Lipitor and tricor  2. QUESTION: "What is your question?" (e.g., double dose of medicine, side effect)     Should patient take medications at the same time together or different times? 3. PRESCRIBER: "Who prescribed the medicine?" Reason: if prescribed by specialist, call should be referred to that group.     PCP 4. SYMPTOMS: "Do you have any symptoms?" If Yes, ask: "What symptoms are you having?"  "How bad are the symptoms (e.g., mild, moderate, severe)     no 5. PREGNANCY:  "Is there any chance that you are pregnant?" "When was your last menstrual period?"      na  Protocols used: Medication Question Call-A-AH

## 2023-02-11 NOTE — Telephone Encounter (Signed)
Left detailed vm it is ok to take meds together

## 2023-02-14 ENCOUNTER — Ambulatory Visit (INDEPENDENT_AMBULATORY_CARE_PROVIDER_SITE_OTHER): Payer: No Typology Code available for payment source | Admitting: *Deleted

## 2023-02-14 DIAGNOSIS — J309 Allergic rhinitis, unspecified: Secondary | ICD-10-CM

## 2023-02-16 ENCOUNTER — Inpatient Hospital Stay: Payer: No Typology Code available for payment source | Admitting: Family Medicine

## 2023-02-16 DIAGNOSIS — M62838 Other muscle spasm: Secondary | ICD-10-CM | POA: Diagnosis not present

## 2023-02-16 DIAGNOSIS — M5416 Radiculopathy, lumbar region: Secondary | ICD-10-CM | POA: Diagnosis not present

## 2023-02-16 DIAGNOSIS — M5136 Other intervertebral disc degeneration, lumbar region: Secondary | ICD-10-CM | POA: Diagnosis not present

## 2023-02-16 DIAGNOSIS — M5126 Other intervertebral disc displacement, lumbar region: Secondary | ICD-10-CM | POA: Diagnosis not present

## 2023-02-16 DIAGNOSIS — M461 Sacroiliitis, not elsewhere classified: Secondary | ICD-10-CM | POA: Diagnosis not present

## 2023-02-17 DIAGNOSIS — H25813 Combined forms of age-related cataract, bilateral: Secondary | ICD-10-CM | POA: Diagnosis not present

## 2023-02-17 DIAGNOSIS — H40013 Open angle with borderline findings, low risk, bilateral: Secondary | ICD-10-CM | POA: Diagnosis not present

## 2023-02-17 DIAGNOSIS — H1045 Other chronic allergic conjunctivitis: Secondary | ICD-10-CM | POA: Diagnosis not present

## 2023-02-17 DIAGNOSIS — H04123 Dry eye syndrome of bilateral lacrimal glands: Secondary | ICD-10-CM | POA: Diagnosis not present

## 2023-02-17 DIAGNOSIS — I639 Cerebral infarction, unspecified: Secondary | ICD-10-CM | POA: Diagnosis not present

## 2023-02-23 ENCOUNTER — Ambulatory Visit: Payer: PPO

## 2023-03-01 ENCOUNTER — Ambulatory Visit (INDEPENDENT_AMBULATORY_CARE_PROVIDER_SITE_OTHER): Payer: No Typology Code available for payment source | Admitting: *Deleted

## 2023-03-01 DIAGNOSIS — J309 Allergic rhinitis, unspecified: Secondary | ICD-10-CM | POA: Diagnosis not present

## 2023-03-02 NOTE — Progress Notes (Unsigned)
Acute Office Visit  Subjective:     Patient ID: William Hicks, male    DOB: 08-Feb-1951, 72 y.o.   MRN: [id number]  No chief complaint on file.   HPI Patient is in today for stomach issues.   Abdominal Complaint:  -Duration: {Blank single:19197::"chronic","days","weeks","months"} -Frequency: {Blank single:19197::"constant","intermittent","occasional","rare","every few minutes","a few times a hour","a few times a day","a few times a week","a few times a month","a few times a year"} -Nature: {Blank multiple:19196::"bloating","sharp","dull","aching","burning","cramping","ill-defined","itchy","pressure-like","pulling","shooting","sore","stabbing","tender","tearing","throbbing"} -Location: {Blank multiple:19196::"diffuse","vague","LUQ","RUQ","epigastric","peri-umbilical","LLQ","RLQ","diffuse","suprapubic". "lower abdominal quadrants"}  -Severity: {Blank single:19197::"mild","moderate","severe","1/10","2/10","3/10","4/10","5/10","6/10","7/10","8/10","9/10","10/10"}  -Radiation: {Blank single:19197::"yes","no"} -Alleviating factors: *** -Aggravating factors: *** -Treatments attempted: {Blank multiple:19196::"none","antacids","PPI","H2 Blocker","laxatives"} -Constipation: {Blank single:19197::"yes","no","intermittent"} -Diarrhea: {Blank single:19197::"yes","no"} -Episodes of diarrhea/day: -Mucous in the stool: {Blank single:19197::"yes","no"} -Heartburn: {Blank single:19197::"yes","no"} -Bloating:{Blank single:19197::"yes","no"} -Passing Gas: {Blank single:19197::"yes","no"} -Nausea: {Blank single:19197::"yes","no"} -Vomiting: {Blank single:19197::"yes","no"} -Episodes of vomit/day: -Melena or hematochezia: {Blank single:19197::"yes","no"} -Rash: {Blank single:19197::"yes","no"} -Jaundice: {Blank single:19197::"yes","no"} -Fever: {Blank single:19197::"yes","no"} -Weight loss: {Blank single:19197::"yes","no"} -Change in Appetite: {Blank single:19197::"yes","no"}   ROS       Objective:    There were no vitals taken for this visit. {Vitals History (Optional):23777}  Physical Exam  No results found for any visits on 03/03/23.      Assessment & Plan:   Problem List Items Addressed This Visit   None   No orders of the defined types were placed in this encounter.   No follow-ups on file.  Margarita Mail, DO

## 2023-03-03 ENCOUNTER — Encounter: Payer: Self-pay | Admitting: Internal Medicine

## 2023-03-03 ENCOUNTER — Ambulatory Visit (INDEPENDENT_AMBULATORY_CARE_PROVIDER_SITE_OTHER): Payer: PPO | Admitting: Internal Medicine

## 2023-03-03 VITALS — BP 136/70 | HR 87 | Temp 98.0°F | Resp 18 | Ht 73.0 in | Wt 214.4 lb

## 2023-03-03 DIAGNOSIS — M6208 Separation of muscle (nontraumatic), other site: Secondary | ICD-10-CM | POA: Diagnosis not present

## 2023-03-03 NOTE — Patient Instructions (Addendum)
It was great seeing you today!  Plan discussed at today's visit: -Please see info below - exercising and weight loss can help decrease abdominal bulge  Follow up in: March, already scheduled  Take care and let us know if you have any questions or concerns prior to your next visit.  Dr. Caralee Ates  Diastasis Recti  Diastasis recti is a condition in which the muscles of the abdomen (rectus abdominis muscles) become thin and separate. The result is a wider space between the muscles of the right and left abdomen (abdominal muscles). This wider space between the muscles may cause a bulge in the middle of the abdomen. This bulge may be noticed when a person is straining or when he or she sits up after lying down. Diastasis recti can affect men and women. It is most common among pregnant women, babies, people with obesity, and people who have had abdominal surgery. Exercise or surgery may help correct this condition. What are the causes? Common causes of this condition include: Pregnancy. As the uterus grows in size, it puts pressure on the abdominal muscles, causing the muscles to separate. Obesity. Excess fat puts pressure on abdominal muscles. Weight lifting. Some exercises of the abdomen. Advanced age. Genetics. Having had surgery on the abdomen before. What increases the risk? This condition is more likely to develop in: Women. Newborns, especially newborns who are born early (prematurely). What are the signs or symptoms? Common symptoms of this condition include: A bulge in the middle of your abdomen. You will notice it most when you sit up or strain. Pain in your low back, hips, or the area between your hip bones (pelvis). Constipation. Being unable to control when you urinate (urinary incontinence). Bloating. Poor posture. How is this diagnosed? This condition is diagnosed with a physical exam. During the exam, your health care provider will ask you to lie flat on your back and do  a crunch or half sit-up. If you have diastasis recti, a bulge will appear lengthwise between your abdominal muscles in the center of your abdomen. Your health care provider will measure the gap between your muscles with one of the following: A medical device used to measure the space between two objects (caliper). A tape measure. CT scan. Ultrasound. Finger spaces. Your health care provider will measure the space using his or her fingers. How is this treated? If your muscle separation is not too large, you may not need treatment. However, if you are a woman who plans to become pregnant again, you should treat this condition before your next pregnancy. Treatment may include: Physical therapy exercises to strengthen and tighten your abdominal muscles. Lifestyle changes such as weight loss and exercise. Over-the-counter pain medicines as needed. Surgery to correct the separation. Follow these instructions at home: Activity Return to your normal activities as told by your health care provider. Ask your health care provider what activities are safe for you. Do exercises as told by your health care provider. Make sure you are doing your exercises and movements correctly when lifting weights or doing exercises using your abdominal muscles or the muscles in the center of your body that give stability (core muscles). Proper form can help to prevent this condition from happening again. General instructions If you are overweight, ask your health care provider for help with weight loss. Losing even a small amount of weight can help to improve your diastasis recti. Take over-the-counter or prescription medicines only as told by your health care provider. Do not strain.  Straining can make the separation worse. Examples of straining include: Pushing hard to have a bowel movement, such as when you have constipation. Lifting heavy objects or lifting children. Standing up and sitting down. You may need to take  these actions to prevent or treat constipation: Drink enough fluid to keep your urine pale yellow. Take over-the-counter or prescription medicines. Eat foods that are high in fiber, such as beans, whole grains, and fresh fruits and vegetables. Limit foods that are high in fat and processed sugars, such as fried or sweet foods. Keep all follow-up visits. This is important. Contact a health care provider if: You notice a new bulge in your abdomen. Get help right away if: You experience severe discomfort in your abdomen. You develop severe abdominal pain along with nausea, vomiting, or a fever. Summary Diastasis recti is a condition in which the muscles of the abdomen (rectus abdominismuscles) become thin and separate. You may notice a bulge in your abdomen because the space has widened between the muscles of the right and left abdomen. The most common symptom is a bulge in the middle of your abdomen. You will notice it most when you sit up or strain. This condition is diagnosed with a physical exam. If the muscle separation is not too big, you may not need treatment. Otherwise, you may need to do physical therapy or have surgery. This information is not intended to replace advice given to you by your health care provider. Make sure you discuss any questions you have with your health care provider. Document Revised: 01/11/2020 Document Reviewed: 01/11/2020 Elsevier Patient Education  2024 [employer].

## 2023-03-15 ENCOUNTER — Ambulatory Visit (INDEPENDENT_AMBULATORY_CARE_PROVIDER_SITE_OTHER): Payer: No Typology Code available for payment source | Admitting: *Deleted

## 2023-03-15 DIAGNOSIS — J309 Allergic rhinitis, unspecified: Secondary | ICD-10-CM | POA: Diagnosis not present

## 2023-03-30 ENCOUNTER — Ambulatory Visit (INDEPENDENT_AMBULATORY_CARE_PROVIDER_SITE_OTHER): Payer: No Typology Code available for payment source | Admitting: *Deleted

## 2023-03-30 DIAGNOSIS — J309 Allergic rhinitis, unspecified: Secondary | ICD-10-CM | POA: Diagnosis not present

## 2023-04-06 ENCOUNTER — Ambulatory Visit (INDEPENDENT_AMBULATORY_CARE_PROVIDER_SITE_OTHER): Payer: No Typology Code available for payment source | Admitting: *Deleted

## 2023-04-06 DIAGNOSIS — J309 Allergic rhinitis, unspecified: Secondary | ICD-10-CM | POA: Diagnosis not present

## 2023-04-07 ENCOUNTER — Ambulatory Visit: Payer: PPO

## 2023-04-07 DIAGNOSIS — Z0000 Encounter for general adult medical examination without abnormal findings: Secondary | ICD-10-CM | POA: Diagnosis not present

## 2023-04-07 NOTE — Patient Instructions (Addendum)
Mr. William Hicks , Thank you for taking time to come for your Medicare Wellness Visit. I appreciate your ongoing commitment to your health goals. Please review the following plan we discussed and let me know if I can assist you in the future.   Referrals/Orders/Follow-Ups/Clinician Recommendations: none  This is a list of the screening recommended for you and due dates:  Health Maintenance  Topic Date Due   Pneumonia Vaccine (3 of 3 - PPSV23 or PCV20) 02/20/2019   COVID-19 Vaccine (6 - 2023-24 season) 01/23/2023   Zoster (Shingles) Vaccine (1 of 2) 04/28/2023*   Medicare Annual Wellness Visit  04/06/2024   Colon Cancer Screening  03/24/2025   DTaP/Tdap/Td vaccine (3 - Td or Tdap) 10/14/2031   Flu Shot  Completed   Hepatitis C Screening  Completed   HPV Vaccine  Aged Out  *Topic was postponed. The date shown is not the original due date.    Advanced directives: (ACP Link)Information on Advanced Care Planning can be found at [facility] Directives Advance Health Care Directives ([email or web address]   Next Medicare Annual Wellness Visit scheduled for next year: Yes   04/12/24 @ 3:50 pm in person

## 2023-04-07 NOTE — Progress Notes (Signed)
Subjective:   William Hicks is a 72 y.o. male who presents for an Initial Medicare Annual Wellness Visit.  Visit Complete: Virtual I connected with  William Hicks on 04/07/23 by a audio enabled telemedicine application and verified that I am speaking with the correct person using two identifiers.  Patient Location: Home  Provider Location: Office/Clinic  I discussed the limitations of evaluation and management by telemedicine. The patient expressed understanding and agreed to proceed.  Vital Signs: Because this visit was a virtual/telehealth visit, some criteria may be missing or patient reported. Any vitals not documented were not able to be obtained and vitals that have been documented are patient reported.       Objective:    There were no vitals filed for this visit. There is no height or weight on file to calculate BMI.     04/07/2023   10:02 AM 04/09/2021    2:19 PM 09/09/2020    1:52 PM 06/25/2018   11:11 AM  Advanced Directives  Does Patient Have a Medical Advance Directive? No No No No  Would patient like information on creating a medical advance directive? No - Patient declined No - Patient declined No - Patient declined     Current Medications (verified) Outpatient Encounter Medications as of 04/07/2023  Medication Sig   albuterol (VENTOLIN HFA) 108 (90 Base) MCG/ACT inhaler Inhale into the lungs.   atorvastatin (LIPITOR) 40 MG tablet Take 1 tablet (40 mg total) by mouth daily.   azelastine (ASTELIN) 0.1 % nasal spray Place 1 spray into both nostrils 2 (two) times daily.   docusate sodium (COLACE) 50 MG capsule Take 50 mg by mouth daily.   fenofibrate (TRICOR) 48 MG tablet Take 1 tablet (48 mg total) by mouth daily.   fluticasone (FLONASE) 50 MCG/ACT nasal spray Place 1 spray into both nostrils in the morning and at bedtime.   leuprolide, 6 Month, (LEUPROLIDE ACETATE, 6 MONTH,) 45 MG injection Inject into the skin.   pantoprazole (PROTONIX) 40 MG  tablet Take 40mg  1-2 times daily.   sucralfate (CARAFATE) 1 g tablet Take 1 g by mouth daily with breakfast.   [DISCONTINUED] fluticasone (VERAMYST) 27.5 MCG/SPRAY nasal spray Place 2 sprays into the nose daily.     No facility-administered encounter medications on file as of 04/07/2023.    Allergies (verified) Pregabalin and Aspirin   History: Past Medical History:  Diagnosis Date   Arthritis    Asthma    Epistaxis    GERD (gastroesophageal reflux disease)    Kidney stones    Prostate cancer (HCC)    Sinusitis    Past Surgical History:  Procedure Laterality Date   LITHOTRIPSY  2008   Family History  Problem Relation Age of Onset   Heart failure Mother    Alzheimer's disease Father    Social History   Socioeconomic History   Marital status: Married    Spouse name: Not on file   Number of children: Not on file   Years of education: Not on file   Highest education level: Not on file  Occupational History   Not on file  Tobacco Use   Smoking status: Former    Types: Cigarettes   Smokeless tobacco: Never  Vaping Use   Vaping status: Never Used  Substance and Sexual Activity   Alcohol use: Yes    Comment: 2-3 week beer   Drug use: No   Sexual activity: Not Currently  Other Topics Concern   Not on  file  Social History Narrative   Not on file   Social Determinants of Health   Financial Resource Strain: Low Risk  (04/07/2023)   Overall Financial Resource Strain (CARDIA)    Difficulty of Paying Living Expenses: Not hard at all  Food Insecurity: No Food Insecurity (04/07/2023)   Hunger Vital Sign    Worried About Running Out of Food in the Last Year: Never true    Ran Out of Food in the Last Year: Never true  Transportation Needs: No Transportation Needs (04/07/2023)   PRAPARE - Administrator, Civil Service (Medical): No    Lack of Transportation (Non-Medical): No  Physical Activity: Sufficiently Active (04/07/2023)   Exercise Vital Sign     Days of Exercise per Week: 4 days    Minutes of Exercise per Session: 120 min  Stress: No Stress Concern Present (04/07/2023)   [employer] of Occupational Health - Occupational Stress Questionnaire    Feeling of Stress : Not at all  Social Connections: Socially Integrated (04/07/2023)   Social Connection and Isolation Panel [NHANES]    Frequency of Communication with Friends and Family: More than three times a week    Frequency of Social Gatherings with Friends and Family: Twice a week    Attends Religious Services: More than 4 times per year    Active Member of [employer] or Organizations: Yes    Attends Engineer, structural: More than 4 times per year    Marital Status: Married    Tobacco Counseling Counseling given: Not Answered   Clinical Intake:  Pre-visit preparation completed: Yes  Pain : No/denies pain     BMI - recorded: 28.2 Nutritional Status: BMI 25 -29 Overweight Nutritional Risks: None Diabetes: No  How often do you need to have someone help you when you read instructions, pamphlets, or other written materials from your doctor or pharmacy?: 1 - Never  Interpreter Needed?: No  Information entered by :: William Bucker, LPN   Activities of Daily Living    04/07/2023   10:03 AM 03/03/2023    8:47 AM  In your present state of health, do you have any difficulty performing the following activities:  Hearing? 0 0  Vision? 0 0  Difficulty concentrating or making decisions? 0 0  Walking or climbing stairs? 0 0  Dressing or bathing? 0 0  Doing errands, shopping? 0 0  Preparing Food and eating ? N   Using the Toilet? N   In the past six months, have you accidently leaked urine? N   Do you have problems with loss of bowel control? N   Managing your Medications? N   Managing your Finances? N   Housekeeping or managing your Housekeeping? N     Patient Care Team: William Mail, DO as PCP - General (Internal Medicine) William Cushing, RN as  Oncology Nurse Navigator [facility]  Indicate any recent Medical Services you may have received from other than [facility] providers in the past year (date may be approximate).     Assessment:   This is a routine wellness examination for [name].  Hearing/Vision screen Hearing Screening - Comments:: No aids Vision Screening - Comments:: Wears glasses- Dr.Groat   Goals Addressed             This Visit's Progress    DIET - EAT MORE FRUITS AND VEGETABLES         Depression Screen    04/07/2023   10:00 AM 03/03/2023  8:47 AM 01/27/2023    3:04 PM 06/01/2022   12:51 PM 05/21/2022    8:02 AM 02/18/2022   11:05 AM 10/07/2021    2:20 PM  PHQ 2/9 Scores  PHQ - 2 Score 0 0 0 0 0 5 0  PHQ- 9 Score 0 0 0 0 0 18 0    Fall Risk    04/07/2023   10:03 AM 03/03/2023    8:47 AM 01/27/2023    3:04 PM 06/01/2022   12:51 PM 05/21/2022    8:01 AM  Fall Risk   Falls in the past year? 0 0 0 0 0  Number falls in past yr: 0 0 0 0 0  Injury with Fall? 0 0 0 0 0  Risk for fall due to : No Fall Risks    No Fall Risks  Follow up Falls prevention discussed;Falls evaluation completed    Falls prevention discussed    MEDICARE RISK AT HOME: Medicare Risk at Home Any stairs in or around the home?: Yes If so, are there any without handrails?: No Home free of loose throw rugs in walkways, pet beds, electrical cords, etc?: Yes Adequate lighting in your home to reduce risk of falls?: Yes Life alert?: No Use of a cane, walker or w/c?: No Grab bars in the bathroom?: Yes Shower chair or bench in shower?: No Elevated toilet seat or a handicapped toilet?: Yes  TIMED UP AND GO:  Was the test performed? No    Cognitive Function:        04/07/2023   10:05 AM  6CIT Screen  What Year? 0 points  What month? 0 points  What time? 0 points  Count back from 20 0 points  Months in reverse 0 points  Repeat phrase 0 points  Total Score 0 points    Immunizations Immunization History   Administered Date(s) Administered   Influenza Split 03/28/2023   Influenza-Unspecified 02/12/2013, 04/23/2013, 02/12/2014, 04/30/2015, 02/22/2016, 04/18/2019, 03/10/2021, 04/08/2022   PFIZER Comirnaty(Gray Top)Covid-19 Tri-Sucrose Vaccine 12/17/2020   PFIZER(Purple Top)SARS-COV-2 Vaccination 06/29/2019, 07/20/2019, 03/22/2020, 12/17/2020   Pfizer Covid-19 Vaccine Bivalent Booster 26yrs & up 03/05/2021   Pneumococcal Conjugate-13 08/07/2015   Pneumococcal Polysaccharide-23 02/19/2014   Td 10/13/2021   Tdap 09/27/2008    TDAP status: Up to date  Flu Vaccine status: Up to date  Pneumococcal vaccine status: Up to date  Covid-19 vaccine status: Completed vaccines  Qualifies for Shingles Vaccine? Yes   Zostavax completed No   Shingrix Completed?: No.    Education has been provided regarding the importance of this vaccine. Patient has been advised to call insurance company to determine out of pocket expense if they have not yet received this vaccine. Advised may also receive vaccine at local pharmacy or Health Dept. Verbalized acceptance and understanding.  Screening Tests Health Maintenance  Topic Date Due   Pneumonia Vaccine 27+ Years old (3 of 3 - PPSV23 or PCV20) 02/20/2019   COVID-19 Vaccine (6 - 2023-24 season) 01/23/2023   Zoster Vaccines- Shingrix (1 of 2) 04/28/2023 (Originally 04/11/1970)   Medicare Annual Wellness (AWV)  04/06/2024   Colonoscopy  03/24/2025   DTaP/Tdap/Td (3 - Td or Tdap) 10/14/2031   INFLUENZA VACCINE  Completed   Hepatitis C Screening  Completed   HPV VACCINES  Aged Out    Health Maintenance  Health Maintenance Due  Topic Date Due   Pneumonia Vaccine 31+ Years old (3 of 3 - PPSV23 or PCV20) 02/20/2019   COVID-19 Vaccine (6 - 2023-24 season)  01/23/2023    Colorectal cancer screening: Type of screening: Colonoscopy. Completed 03/24/22. Repeat every 5 years  Lung Cancer Screening: (Low Dose CT Chest recommended if Age 58-80 years, 20 pack-year  currently smoking OR have quit w/in 15years.) does not qualify.    Additional Screening:  Hepatitis C Screening: does qualify; Completed 01/27/23  Vision Screening: Recommended annual ophthalmology exams for early detection of glaucoma and other disorders of the eye. Is the patient up to date with their annual eye exam?  Yes  Who is the provider or what is the name of the office in which the patient attends annual eye exams? Dr.Groat If pt is not established with a provider, would they like to be referred to a provider to establish care? No .   Dental Screening: Recommended annual dental exams for proper oral hygiene    Community Resource Referral / Chronic Care Management: CRR required this visit?  No   CCM required this visit?  No    Plan:     I have personally reviewed and noted the following in the patient's chart:   Medical and social history Use of alcohol, tobacco or illicit drugs  Current medications and supplements including opioid prescriptions. Patient is not currently taking opioid prescriptions. Functional ability and status Nutritional status Physical activity Advanced directives List of other physicians Hospitalizations, surgeries, and ER visits in previous 12 months Vitals Screenings to include cognitive, depression, and falls Referrals and appointments  In addition, I have reviewed and discussed with patient certain preventive protocols, quality metrics, and best practice recommendations. A written personalized care plan for preventive services as well as general preventive health recommendations were provided to patient.     Hal Hope, LPN   95/62/[id number]   After Visit Summary: (MyChart) Due to this being a telephonic visit, the after visit summary with patients personalized plan was offered to patient via MyChart   Nurse Notes: none

## 2023-04-11 ENCOUNTER — Ambulatory Visit (INDEPENDENT_AMBULATORY_CARE_PROVIDER_SITE_OTHER): Payer: No Typology Code available for payment source | Admitting: *Deleted

## 2023-04-11 DIAGNOSIS — J309 Allergic rhinitis, unspecified: Secondary | ICD-10-CM

## 2023-04-27 ENCOUNTER — Telehealth: Payer: Self-pay | Admitting: Allergy & Immunology

## 2023-04-27 ENCOUNTER — Other Ambulatory Visit: Payer: Self-pay | Admitting: Internal Medicine

## 2023-04-27 MED ORDER — FLUTICASONE PROPIONATE 50 MCG/ACT NA SUSP: 1.0000 | Freq: Two times a day (BID) | NASAL | 5 refills | Status: AC

## 2023-04-27 MED ORDER — AZELASTINE HCL 0.1 % NA SOLN: 1.0000 | Freq: Two times a day (BID) | NASAL | 5 refills | Status: AC

## 2023-04-27 NOTE — Telephone Encounter (Signed)
Informed pt of sending in refills and verified pharmacy

## 2023-04-27 NOTE — Telephone Encounter (Signed)
Patient request refill for azelastine and fluticasone

## 2023-04-27 NOTE — Telephone Encounter (Signed)
Medication Refill -  Most Recent Primary Care Visit:  Provider: Hal Hope  Department: [facility]  Visit Type: MEDICARE AWV, INITIAL  Date: 04/07/2023  Medication: docusate sodium (COLACE) 50 MG capsule   Has the patient contacted their pharmacy? No (Agent: If no, request that the patient contact the pharmacy for the refill. If patient does not wish to contact the pharmacy document the reason why and proceed with request.) (Agent: If yes, when and what did the pharmacy advise?)  Is this the correct pharmacy for this prescription? No If no, delete pharmacy and type the correct one.  This is the patient's preferred pharmacy: [id number] - [place], [place] - [address] AT [facility]   Has the prescription been filled recently? No  Is the patient out of the medication? Yes  Has the patient been seen for an appointment in the last year OR does the patient have an upcoming appointment? Yes  Can we respond through MyChart? No  Pt was getting all his meds at the [place], but is going to stop that and get locally

## 2023-04-28 ENCOUNTER — Telehealth: Payer: Self-pay | Admitting: Internal Medicine

## 2023-04-28 ENCOUNTER — Other Ambulatory Visit: Payer: Self-pay | Admitting: Emergency Medicine

## 2023-04-28 MED ORDER — PANTOPRAZOLE SODIUM 40 MG PO TBEC: DELAYED_RELEASE_TABLET | ORAL | 1 refills | Status: DC

## 2023-04-28 NOTE — Telephone Encounter (Signed)
Medication Refill -  Most Recent Primary Care Visit:  Provider: Hal Hope  Department: [facility]  Visit Type: MEDICARE AWV, INITIAL  Date: 04/07/2023  Medication:  pantoprazole (PROTONIX) 40 MG tablet   Has the patient contacted their pharmacy? No  Is this the correct pharmacy for this prescription? Yes  This is the patient's preferred pharmacy:  [id number] [place] - [address] AT [facility] [address] [place] [address] Phone: [phone] Fax: [phone]   Has the prescription been filled recently? Yes  Is the patient out of the medication? No  Has the patient been seen for an appointment in the last year OR does the patient have an upcoming appointment? Yes  Can we respond through MyChart? No  Agent: Please be advised that Rx refills may take up to 3 business days. We ask that you follow-up with your pharmacy.  Patient is requesting a 90 day supply

## 2023-04-28 NOTE — Telephone Encounter (Signed)
Order sent to [facility]

## 2023-04-29 MED ORDER — DOCUSATE SODIUM 50 MG PO CAPS: 50.0000 mg | ORAL_CAPSULE | Freq: Every day | ORAL | 1 refills | Status: AC | PRN

## 2023-04-29 NOTE — Telephone Encounter (Signed)
Requested medications are due for refill today.  Unsure  Requested medications are on the active medications list.  yes  Last refill. 05/21/2022  Future visit scheduled.   yes  Notes to clinic.  Medication is historical.    Requested Prescriptions  Pending Prescriptions Disp Refills   docusate sodium (COLACE) 50 MG capsule 10 capsule     Sig: Take 1 capsule (50 mg total) by mouth daily.     Over the Counter:  OTC Passed - 04/27/2023 11:59 AM      Passed - Valid encounter within last 12 months    Recent Outpatient Visits           1 month ago Diastasis recti   [facility] Margarita Mail, DO   3 months ago Annual physical exam   [facility] Margarita Mail, DO   11 months ago Eustachian tube dysfunction, bilateral   [facility] Margarita Mail, [place]   11 months ago Acute non-recurrent maxillary sinusitis   [facility] Margarita Mail, DO   1 year ago Anxiety disorder, unspecified type   [facility] Danelle Berry, PA-C       Future Appointments             In 1 month Dellis Anes, Hetty Ely, MD [facility] [place] [facility] [place]   In 2 months Margarita Mail, DO [facility]

## 2023-05-10 ENCOUNTER — Other Ambulatory Visit: Payer: Self-pay | Admitting: Internal Medicine

## 2023-05-10 ENCOUNTER — Ambulatory Visit (INDEPENDENT_AMBULATORY_CARE_PROVIDER_SITE_OTHER): Payer: No Typology Code available for payment source | Admitting: *Deleted

## 2023-05-10 DIAGNOSIS — E781 Pure hyperglyceridemia: Secondary | ICD-10-CM

## 2023-05-10 DIAGNOSIS — J309 Allergic rhinitis, unspecified: Secondary | ICD-10-CM | POA: Diagnosis not present

## 2023-05-10 NOTE — Telephone Encounter (Signed)
Requested Prescriptions  Pending Prescriptions Disp Refills   fenofibrate (TRICOR) 48 MG tablet [Pharmacy Med Name: FENOFIBRATE 48MG  TABLETS] 90 tablet 1    Sig: TAKE 1 TABLET(48 MG) BY MOUTH DAILY     Cardiovascular:  Antilipid - Fibric Acid Derivatives Failed - 05/10/2023  3:07 PM      Failed - HGB in normal range and within 360 days    Hemoglobin  Date Value Ref Range Status  01/27/2023 12.1 (L) 13.2 - 17.1 g/dL Final  09/81/1914 78.2 (L) 13.0 - 17.7 g/dL Final         Failed - HCT in normal range and within 360 days    HCT  Date Value Ref Range Status  01/27/2023 37.1 (L) 38.5 - 50.0 % Final   Hematocrit  Date Value Ref Range Status  12/28/2016 39.3 37.5 - 51.0 % Final         Failed - Lipid Panel in normal range within the last 12 months    Cholesterol, Total  Date Value Ref Range Status  06/16/2021 161 100 - 199 mg/dL Final   Cholesterol  Date Value Ref Range Status  01/27/2023 166 <200 mg/dL Final   LDL Cholesterol (Calc)  Date Value Ref Range Status  01/27/2023 78 mg/dL (calc) Final    Comment:    Reference range: <100 . Desirable range <100 mg/dL for primary prevention;   <70 mg/dL for patients with CHD or diabetic patients  with > or = 2 CHD risk factors. Marland Kitchen LDL-C is now calculated using the Martin-Hopkins  calculation, which is a validated novel method providing  better accuracy than the Friedewald equation in the  estimation of LDL-C.  Horald Pollen et al. Lenox Ahr. 9562;130(86): 2061-2068  (http://education.[employer].com/faq/FAQ164)    HDL  Date Value Ref Range Status  01/27/2023 55 > OR = 40 mg/dL Final  57/84/6962 55 >95 mg/dL Final   Triglycerides  Date Value Ref Range Status  01/27/2023 237 (H) <150 mg/dL Final    Comment:    . If a non-fasting specimen was collected, consider repeat triglyceride testing on a fasting specimen if clinically indicated.  Perry Mount et al. J. of Clin. Lipidol. 2015;9:129-169. Marland Kitchen          Passed - ALT in  normal range and within 360 days    ALT  Date Value Ref Range Status  01/27/2023 24 9 - 46 U/L Final         Passed - AST in normal range and within 360 days    AST  Date Value Ref Range Status  01/27/2023 24 10 - 35 U/L Final         Passed - Cr in normal range and within 360 days    Creat  Date Value Ref Range Status  01/27/2023 1.23 0.70 - 1.35 mg/dL Final         Passed - PLT in normal range and within 360 days    Platelets  Date Value Ref Range Status  01/27/2023 166 140 - 400 Thousand/uL Final  12/28/2016 171 150 - 379 x10E3/uL Final         Passed - WBC in normal range and within 360 days    WBC  Date Value Ref Range Status  01/27/2023 4.2 3.8 - 10.8 Thousand/uL Final         Passed - eGFR is 30 or above and within 360 days    GFR calc Af Amer  Date Value Ref Range Status  06/25/2018 >60 >60 mL/min  Final   GFR, Estimated  Date Value Ref Range Status  07/10/2021 >60 >60 mL/min Final    Comment:    (NOTE) Calculated using the CKD-EPI Creatinine Equation (2021)    eGFR  Date Value Ref Range Status  01/27/2023 64 > OR = 60 mL/min/1.12m2 Final         Passed - Valid encounter within last 12 months    Recent Outpatient Visits           2 months ago Diastasis recti   [facility] Margarita Mail, DO   3 months ago Annual physical exam   [facility] Margarita Mail, DO   11 months ago Eustachian tube dysfunction, bilateral   [facility] Margarita Mail, [place]   11 months ago Acute non-recurrent maxillary sinusitis   [facility] Margarita Mail, DO   1 year ago Anxiety disorder, unspecified type   [facility] Danelle Berry, PA-C       Future Appointments             In 1 month Dellis Anes, Hetty Ely, MD [facility] [place] at [place]   In 2 months Margarita Mail, DO [facility]  [facility]

## 2023-06-08 ENCOUNTER — Ambulatory Visit (INDEPENDENT_AMBULATORY_CARE_PROVIDER_SITE_OTHER): Payer: No Typology Code available for payment source | Admitting: *Deleted

## 2023-06-08 DIAGNOSIS — J309 Allergic rhinitis, unspecified: Secondary | ICD-10-CM

## 2023-06-16 ENCOUNTER — Encounter: Payer: Self-pay | Admitting: Allergy & Immunology

## 2023-06-16 ENCOUNTER — Other Ambulatory Visit: Payer: Self-pay

## 2023-06-16 ENCOUNTER — Ambulatory Visit: Payer: No Typology Code available for payment source | Admitting: Allergy & Immunology

## 2023-06-16 VITALS — BP 142/60 | HR 80 | Temp 98.0°F | Resp 12 | Ht 70.47 in | Wt 217.5 lb

## 2023-06-16 DIAGNOSIS — J3089 Other allergic rhinitis: Secondary | ICD-10-CM | POA: Diagnosis not present

## 2023-06-16 DIAGNOSIS — J4520 Mild intermittent asthma, uncomplicated: Secondary | ICD-10-CM | POA: Diagnosis not present

## 2023-06-16 DIAGNOSIS — J302 Other seasonal allergic rhinitis: Secondary | ICD-10-CM

## 2023-06-16 DIAGNOSIS — J3489 Other specified disorders of nose and nasal sinuses: Secondary | ICD-10-CM

## 2023-06-16 NOTE — Progress Notes (Signed)
FOLLOW UP  Date of Service/Encounter:  06/16/23   Assessment:   Mild intermittent asthma, uncomplicated - well controlled   Seasonal and perennial allergic rhinitis - currently on immunotherapy with maintenance reached January 2024    Recent stroke - no longer on blood thinners    Right-sided tinnitus with throbbing  Plan/Recommendations:   1. Seasonal and perennial allergic rhinitis (grasses and dust mites) - with allergic sinusitis today - Continue with: Astelin one spray per nostril up to twice daily (AIM FOR EAR ON EACH SIDE) - Continue with: Flonase (fluticasone) one spray per nostril TWICE daily (AIM FOR EAR ON EACH SIDE) - You can use an extra dose of the antihistamine, if needed, for breakthrough symptoms.  - Consider nasal saline rinses 1-2 times daily to remove allergens from the nasal cavities as well as help with mucous clearance (this is especially helpful to do before the nasal sprays are given) - Continue with allergy shots at the same schedule.  - We will continue allergy shots through January 2027 at least (up to January 2029).   2. GERD - Continue with pantoprazole 1-2 times daily.   3. Intermittent asthma, uncomplicated - Lung testing looks good today. - We are not going to make any changes today.   4. Return in about 6 months (around 12/14/2023). You can have the follow up appointment with Dr. Dellis Anes or a Nurse Practicioner (our Nurse Practitioners are excellent and always have Physician oversight!).   Subjective:   William Hicks is a 73 y.o. male presenting today for follow up of  Chief Complaint  Patient presents with   Follow-up    Stuffy nose, nose bleeds, throbbing in sinuses. About a month in duration.    William Hicks has a history of the following: Patient Active Problem List   Diagnosis Date Noted   Anxiety disorder 02/18/2022   History of malignant neoplasm of prostate 10/07/2021   History of CVA (cerebrovascular  accident) without residual deficits 10/07/2021   Mild intermittent asthma without complication 10/07/2021   Prediabetes 10/07/2021   Environmental allergies 10/07/2021   Anticoagulated by anticoagulation treatment 09/16/2020   CVA (cerebral vascular accident) (HCC) 09/09/2020   Cluster headache 09/09/2020   Acute CVA (cerebrovascular accident) (HCC) 09/09/2020   Malignant neoplasm of prostate (HCC) 05/06/2015   Degenerative arthritis of hip 11/14/2013   HLD (hyperlipidemia) 09/25/2012   GERD (gastroesophageal reflux disease) 08/17/2011   Abscess, gluteal, right 12/22/2010   Abscess, gluteal 12/18/2010   HEARTBURN 10/30/2008   History of colonic polyps 10/30/2008    History obtained from: chart review and patient.  Discussed the use of AI scribe software for clinical note transcription with the patient and/or guardian, who gave verbal consent to proceed.  William Hicks is a 73 y.o. male presenting for a follow up visit.  We last saw him in July 2024.  At that time, COVID testing was negative.  We started him on Augmentin for sinusitis.  We continued with Astelin as well as Flonase.  We also continue with allergy shots at the same schedule.  GERD was controlled with Protonix 1-2 times daily.  Since the last visit, he has done well.   Asthma/Respiratory Symptom History: Siri Cole uses an inhaler approximately once a week and denies any recent hospital visits for breathing issues. He does not cough at night but reports frequent sneezing. He uses his nasal sprays daily and twice a day when symptoms are severe. He has not needed antibiotics since his last visit in  July.  Allergic Rhinitis Symptom History: Laker presents with complaints of dryness and occasional bleeding in the nose, which he attributes to the current weather conditions. The patient has been using nasal saline gel, which he finds beneficial. He also reports a throbbing sensation, which he describes as intermittent and not constant. The  onset of this symptom was within the current month. He denies any history of high blood pressure. He remains on the allergy shots which seem to be working well for him. He has not been on antibiotics at all since last visit. He reports that his allergy symptoms have improved significantly since starting allergy shots, and he confirms that his condition is much better than before the initiation of this treatment. He denies any recent sinus or ear infections, or pneumonia.   Allergy shots are going well. William Hicks is on allergen immunotherapy. He receives one injection. Immunotherapy script #1 contains grasses and dust mites. He currently receives 0.72mL of the RED vial (1/100). He started shots September of 2023 and reached maintenance in January 2024.   GERD Symptom History: The patient also has a history of reflux, which he describes as intermittent.   He has received his flu shot for the year. He has not received an updated pneumonia shot. 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 He has not flown recently and expresses some concern about how would tolerate flying.Marland Kitchen  However served in [employer] and was deployed for [employer].   Otherwise, there have been no changes to his past medical history, surgical history, family history, or social history.    Review of systems otherwise negative other than that mentioned in the HPI.    Objective:   Blood pressure (!) 142/60, pulse 80, temperature 98 F (36.7 C), temperature source Temporal, resp. rate 12, height 5' 10.47" (1.79 m), weight 217 lb 8 oz (98.7 kg), SpO2 99%. Body mass index is 30.79 kg/m.    Physical Exam Vitals reviewed.  Constitutional:      Appearance: He is well-developed.     Comments: Pleasant.  Cooperative.  HENT:     Head: Normocephalic and atraumatic.     Right Ear: Tympanic membrane, ear canal and  external ear normal. No drainage, swelling or tenderness. Tympanic membrane is not injected, scarred, erythematous, retracted or bulging.     Left Ear: Tympanic membrane, ear canal and external ear normal. No drainage, swelling or tenderness. Tympanic membrane is not injected, scarred, erythematous, retracted or bulging.     Ears:     Comments: Nasal sounding voice.    Nose: No nasal deformity, septal deviation, mucosal edema or rhinorrhea.     Right Turbinates: Enlarged, swollen and pale.     Left Turbinates: Enlarged, swollen and pale.     Right Sinus: No maxillary sinus tenderness or frontal sinus tenderness.     Left Sinus: No maxillary sinus tenderness or frontal sinus tenderness.     Comments: No polyps.     Mouth/Throat:     Mouth: Mucous membranes are not pale and not dry.     Pharynx: Uvula midline.  Eyes:     General: Allergic shiner present.        Right eye: No discharge.        Left eye: No discharge.     Conjunctiva/sclera: Conjunctivae normal.     Right  eye: Right conjunctiva is not injected. No chemosis.    Left eye: Left conjunctiva is not injected. No chemosis.    Pupils: Pupils are equal, round, and reactive to light.  Cardiovascular:     Rate and Rhythm: Normal rate and regular rhythm.     Heart sounds: Normal heart sounds.  Pulmonary:     Effort: Pulmonary effort is normal. No tachypnea, accessory muscle usage or respiratory distress.     Breath sounds: Normal breath sounds. No wheezing, rhonchi or rales.  Chest:     Chest wall: No tenderness.  Abdominal:     Tenderness: There is no abdominal tenderness. There is no guarding or rebound.  Lymphadenopathy:     Head:     Right side of head: No submandibular, tonsillar or occipital adenopathy.     Left side of head: No submandibular, tonsillar or occipital adenopathy.     Cervical: No cervical adenopathy.  Skin:    Coloration: Skin is not pale.     Findings: No abrasion, erythema, petechiae or rash. Rash is not  papular, urticarial or vesicular.  Neurological:     Mental Status: He is alert.  Psychiatric:        Behavior: Behavior is cooperative.      Diagnostic studies:    Spirometry: results normal (FEV1: 2.56/88%, FVC: 3.20/83%, FEV1/FVC: 80%).    Spirometry consistent with normal pattern.   Allergy Studies: none        Malachi Bonds, MD  [facility]

## 2023-06-16 NOTE — Patient Instructions (Addendum)
1. Seasonal and perennial allergic rhinitis (grasses and dust mites) - with allergic sinusitis today - Continue with: Astelin one spray per nostril up to twice daily (AIM FOR EAR ON EACH SIDE) - Continue with: Flonase (fluticasone) one spray per nostril TWICE daily (AIM FOR EAR ON EACH SIDE) - You can use an extra dose of the antihistamine, if needed, for breakthrough symptoms.  - Consider nasal saline rinses 1-2 times daily to remove allergens from the nasal cavities as well as help with mucous clearance (this is especially helpful to do before the nasal sprays are given) - Continue with allergy shots at the same schedule.  - We will continue allergy shots through January 2027 at least (up to January 2029).   2. GERD - Continue with pantoprazole 1-2 times daily.   3. Intermittent asthma, uncomplicated - Lung testing looks good today. - We are not going to make any changes today.   4. Return in about 6 months (around 12/14/2023). You can have the follow up appointment with Dr. Dellis Anes or a Nurse Practicioner (our Nurse Practitioners are excellent and always have Physician oversight!).    Please inform us of any Emergency Department visits, hospitalizations, or changes in symptoms. Call us before going to the ED for breathing or allergy symptoms since we might be able to fit you in for a sick visit. Feel free to contact us anytime with any questions, problems, or concerns.  It was a pleasure to see you again today!  Websites that have reliable patient information: 1. American Academy of Asthma, Allergy, and Immunology: www.aaaai.org 2. Food Allergy Research and Education (FARE): foodallergy.org 3. Mothers of Asthmatics: http://www.asthmacommunitynetwork.org 4. American College of Allergy, Asthma, and Immunology: www.acaai.org      "Like" [place] on Facebook and Instagram for our latest updates!      A healthy democracy works best when [employer] participate! Make sure you are  registered to vote! If you have moved or changed any of your contact information, you will need to get this updated before voting! Scan the QR codes below to learn more!

## 2023-07-11 ENCOUNTER — Ambulatory Visit (INDEPENDENT_AMBULATORY_CARE_PROVIDER_SITE_OTHER): Payer: No Typology Code available for payment source | Admitting: Allergy

## 2023-07-11 ENCOUNTER — Encounter: Payer: Self-pay | Admitting: Allergy

## 2023-07-11 ENCOUNTER — Other Ambulatory Visit: Payer: Self-pay

## 2023-07-11 VITALS — BP 138/70 | HR 72 | Temp 97.9°F | Resp 12

## 2023-07-11 DIAGNOSIS — R430 Anosmia: Secondary | ICD-10-CM | POA: Diagnosis not present

## 2023-07-11 DIAGNOSIS — B9789 Other viral agents as the cause of diseases classified elsewhere: Secondary | ICD-10-CM

## 2023-07-11 DIAGNOSIS — J3089 Other allergic rhinitis: Secondary | ICD-10-CM

## 2023-07-11 DIAGNOSIS — J988 Other specified respiratory disorders: Secondary | ICD-10-CM | POA: Diagnosis not present

## 2023-07-11 DIAGNOSIS — J4521 Mild intermittent asthma with (acute) exacerbation: Secondary | ICD-10-CM | POA: Diagnosis not present

## 2023-07-11 DIAGNOSIS — K219 Gastro-esophageal reflux disease without esophagitis: Secondary | ICD-10-CM

## 2023-07-11 DIAGNOSIS — J302 Other seasonal allergic rhinitis: Secondary | ICD-10-CM

## 2023-07-11 MED ORDER — IPRATROPIUM BROMIDE 0.03 % NA SOLN
1.0000 | Freq: Two times a day (BID) | NASAL | 1 refills | Status: AC | PRN
Start: 2023-07-11 — End: ?

## 2023-07-11 MED ORDER — METHYLPREDNISOLONE 4 MG PO TBPK: ORAL_TABLET | ORAL | 0 refills | Status: DC

## 2023-07-11 NOTE — Patient Instructions (Addendum)
 Respiratory infection See below for symptomatic management. Take mucinex liquid twice a day to loosen up the mucous and take with plenty of water. Start medrol pak.  Use Atrovent (ipratropium) 0.03% 1-2 sprays per nostril twice a day as needed for runny nose/drainage. Stop Astelin for now.  Use Flonase (fluticasone) nasal spray 1-2 sprays per nostril once a day as needed for nasal congestion.  Nasal saline spray (i.e., Simply Saline) or nasal saline lavage (i.e., NeilMed) is recommended as needed and prior to medicated nasal sprays.  Allergic rhinitis Return for allergy injections when you are feeling better. Use over the counter antihistamines such as Zyrtec (cetirizine), Claritin (loratadine), Allegra (fexofenadine), or Xyzal (levocetirizine) daily as needed. May switch antihistamines every few months.  Breathing May use albuterol rescue inhaler 2 puffs very 4 to 6 hours as needed for shortness of breath, chest tightness, coughing, and wheezing.  Monitor frequency of use - if you need to use it more than twice per week on a consistent basis let us know.   Follow up in June with Dr. Dellis Anes as scheduled.   Drink plenty of fluids. Water, juice, clear broth or warm lemon water are good choices. Avoid caffeine and alcohol, which can dehydrate you. Eat chicken soup. Chicken soup and other warm fluids can be soothing and loosen congestion. Rest. Adjust your room's temperature and humidity. Keep your room warm but not overheated. If the air is dry, a cool-mist humidifier or vaporizer can moisten the air and help ease congestion and coughing. Keep the humidifier clean to prevent the growth of bacteria and molds. Soothe your throat. Perform a saltwater gargle. Dissolve one-quarter to a half teaspoon of salt in a 4- to 8-ounce glass of warm water. This can relieve a sore or scratchy throat temporarily. Use saline nasal drops. To help relieve nasal congestion, try saline nasal drops. You can  buy these drops over the counter, and they can help relieve symptoms ? even in children. Take over-the-counter cold and cough medications. For adults and children older than 5, over-the-counter decongestants, antihistamines and pain relievers might offer some symptom relief. However, they won't prevent a cold or shorten its duration.

## 2023-07-11 NOTE — Progress Notes (Signed)
 Follow Up Note  RE: William Hicks MRN: [id number] DOB: 1950/09/03 Date of Office Visit: 07/11/2023  Referring provider: Margarita Mail, DO Primary care provider: Margarita Mail, DO  Chief Complaint: Nasal Congestion  History of Present Illness: I had the pleasure of seeing William Hicks for a follow up visit at the [facility] on 07/11/2023. He is a 73 y.o. male, who is being followed for allergic rhinitis on AIT, GERD and asthma. His previous allergy office visit was on 06/16/2023 with Dr. Dellis Anes. Today is a new complaint visit of not feeling well .  Discussed the use of AI scribe software for clinical note transcription with the patient, who gave verbal consent to proceed.    He has been experiencing significant postnasal drip and a sensation of mucus accumulation in his lungs for the past five to six days, starting around last Tuesday or Wednesday. He also notes a loss of smell, which he describes as having 'gone away again.' No fever or chills. He has not tested for the flu but has tested negative for COVID-19 using a home test.  For symptom management, he has been taking Claritin and using two nasal sprays, Astelin and Flonase, daily in the morning. He also performs a sinus rinse with a saline solution and distilled water once a day, although he notes that nothing significant comes out during the rinse.  He experiences occasional wheezing and uses an albuterol inhaler once or twice a week, which he finds helps to 'calm it down a little bit.' He reports a mild cough with mostly clear phlegm, occasionally yellow, and denies significant nasal stuffiness or rhinorrhea.   He has difficulty swallowing large pills, preferring liquid forms of medication when possible.  He mentions that he is supposed to receive allergy shots but has postponed them until he feels better.  Wife is having similar symptoms.     Assessment and Plan: William Hicks is a 73 y.o.  male with: Viral respiratory infection Anosmia Mild intermittent asthma with acute exacerbation Postnasal drip, loss of smell, and mild cough for 5-6 days. No fever or significant sputum production. Negative at home Covid-19 testing. Likely viral etiology. Currently on Claritin, Astelin, Flonase, and sinus rinse. See below for symptomatic management. Take mucinex liquid twice a day to loosen up the mucous and take with plenty of water. Start medrol pak. Use Atrovent (ipratropium) 0.03% 1-2 sprays per nostril twice a day as needed for runny nose/drainage. Stop Astelin for now.  Use Flonase (fluticasone) nasal spray 1-2 sprays per nostril once a day as needed for nasal congestion.  Nasal saline spray (i.e., Simply Saline) or nasal saline lavage (i.e., NeilMed) is recommended as needed and prior to medicated nasal sprays. May use albuterol rescue inhaler 2 puffs very 4 to 6 hours as needed for shortness of breath, chest tightness, coughing, and wheezing.  Monitor frequency of use - if you need to use it more than twice per week on a consistent basis let us know.   Seasonal and perennial allergic rhinitis Return for allergy injections when you are feeling better. Use over the counter antihistamines such as Zyrtec (cetirizine), Claritin (loratadine), Allegra (fexofenadine), or Xyzal (levocetirizine) daily as needed. May switch antihistamines every few months.  Gastroesophageal reflux disease, unspecified whether esophagitis present Continue with pantoprazole 1-2 times daily.   Return in about 4 months (around 11/08/2023).  Meds ordered this encounter  Medications   ipratropium (ATROVENT) 0.03 % nasal spray    Sig: Place 1-2  sprays into both nostrils 2 (two) times daily as needed (nasal drainage).    Dispense:  30 mL    Refill:  1   methylPREDNISolone (MEDROL DOSEPAK) 4 MG TBPK tablet    Sig: Take 6 tablets on day 1, 5 tablets on day 2, 4 tabs on day 3, 3 tabs on day 4, 2 tabs on day 5, 1 tab  on day 6.    Dispense:  21 tablet    Refill:  0   Lab Orders  No laboratory test(s) ordered today    Diagnostics: None.   Medication List:  Current Outpatient Medications  Medication Sig Dispense Refill   albuterol (VENTOLIN HFA) 108 (90 Base) MCG/ACT inhaler Inhale into the lungs.     atorvastatin (LIPITOR) 40 MG tablet Take 1 tablet (40 mg total) by mouth daily. 90 tablet 3   azelastine (ASTELIN) 0.1 % nasal spray Place 1 spray into both nostrils 2 (two) times daily. 30 mL 5   docusate sodium (COLACE) 50 MG capsule Take 1 capsule (50 mg total) by mouth daily as needed for mild constipation. 10 capsule 1   fenofibrate (TRICOR) 48 MG tablet TAKE 1 TABLET(48 MG) BY MOUTH DAILY 90 tablet 1   fluticasone (FLONASE) 50 MCG/ACT nasal spray Place 1 spray into both nostrils in the morning and at bedtime. 16 g 5   ipratropium (ATROVENT) 0.03 % nasal spray Place 1-2 sprays into both nostrils 2 (two) times daily as needed (nasal drainage). 30 mL 1   leuprolide, 6 Month, (LEUPROLIDE ACETATE, 6 MONTH,) 45 MG injection Inject into the skin.     methylPREDNISolone (MEDROL DOSEPAK) 4 MG TBPK tablet Take 6 tablets on day 1, 5 tablets on day 2, 4 tabs on day 3, 3 tabs on day 4, 2 tabs on day 5, 1 tab on day 6. 21 tablet 0   pantoprazole (PROTONIX) 40 MG tablet Take 40mg  1-2 times daily. 90 tablet 1   sucralfate (CARAFATE) 1 g tablet Take 1 g by mouth daily with breakfast.     No current facility-administered medications for this visit.   Allergies: Allergies  Allergen Reactions   Pregabalin Other (See Comments) and Rash   Aspirin Diarrhea and Nausea And Vomiting   I reviewed his past medical history, social history, family history, and environmental history and no significant changes have been reported from his previous visit.  Review of Systems  Constitutional:  Negative for appetite change, chills, fever and unexpected weight change.  HENT:  Positive for postnasal drip. Negative for congestion  and rhinorrhea.        Anosmia  Eyes:  Negative for itching.  Respiratory:  Positive for cough. Negative for chest tightness, shortness of breath and wheezing.        Chest congestion  Cardiovascular:  Negative for chest pain.  Gastrointestinal:  Negative for abdominal pain.  Genitourinary:  Negative for difficulty urinating.  Skin:  Negative for rash.  Allergic/Immunologic: Positive for environmental allergies.  Neurological:  Negative for headaches.    Objective: BP 138/70 (BP Location: Left Arm, Cuff Size: Normal)   Pulse 72   Temp 97.9 F (36.6 C) (Temporal)   Resp 12   SpO2 99%  There is no height or weight on file to calculate BMI. Physical Exam Vitals and nursing note reviewed.  Constitutional:      Appearance: Normal appearance. He is well-developed.  HENT:     Head: Normocephalic and atraumatic.     Right Ear: Tympanic membrane and external  ear normal.     Left Ear: Tympanic membrane and external ear normal.     Nose: Nose normal.     Mouth/Throat:     Mouth: Mucous membranes are moist.     Pharynx: Oropharynx is clear.  Eyes:     Conjunctiva/sclera: Conjunctivae normal.  Cardiovascular:     Rate and Rhythm: Normal rate and regular rhythm.     Heart sounds: Normal heart sounds. No murmur heard.    No friction rub. No gallop.  Pulmonary:     Effort: Pulmonary effort is normal.     Breath sounds: Normal breath sounds. No wheezing, rhonchi or rales.  Musculoskeletal:     Cervical back: Neck supple.  Skin:    General: Skin is warm.     Findings: No rash.  Neurological:     Mental Status: He is alert and oriented to person, place, and time.  Psychiatric:        Behavior: Behavior normal.    Previous notes and tests were reviewed. The plan was reviewed with the patient/family, and all questions/concerned were addressed.  It was my pleasure to see Shawan today and participate in his care. Please feel free to contact me with any questions or  concerns.  Sincerely,  Wyline Mood, DO Allergy & Immunology  [facility] office: [phone] [facility] office: [phone]

## 2023-07-13 ENCOUNTER — Ambulatory Visit: Payer: Self-pay | Admitting: Internal Medicine

## 2023-07-13 NOTE — Telephone Encounter (Signed)
 Chief Complaint: Foot pain  Symptoms: bilateral foot pain, numbness, shooting pain through the toes, cold to touch sometimes Frequency: comes and goes Pertinent Negatives: Patient denies foot ulcers, injury, swelling Disposition: [] ED /[] Urgent Care (no appt availability in office) / [x] Appointment(In office/virtual)/ []  [facility]/ [] Home Care/ [] Refused Recommended Disposition /[] [facility]/ []  Follow-up with PCP Additional Notes: Patient states he has had foot numbness for a few years but it seems to be getting worse since he started his part time job and he is walking more than he was before the job. Patient states some times the pain shoots through is toes as well. Patient states he has not addressed this with his PCP recently. Care advice was given and patient was offered first available with PCP. Patient stated he can not make it due to work and asked for a Wednesday appointment. Patient has been scheduled Wednesday 07/20/23 at 0900 per request. Advised if symptoms get worse to callback. Patient verbalized understanding.  Copied from CRM [id number]. Topic: Clinical - Red Word Triage >> Jul 13, 2023 11:34 AM Gildardo Pounds wrote: Red Word that prompted transfer to Nurse Triage: Feet, lighting pain, numb and cold and throbbing. Callback number is [phone] Reason for Disposition  [1] MODERATE pain (e.g., interferes with normal activities, limping) AND [2] present > 3 days  Answer Assessment - Initial Assessment Questions 1. ONSET: "When did the pain start?"      A few years now  2. LOCATION: "Where is the pain located?"      Bilateral feet  3. PAIN: "How bad is the pain?"    (Scale 1-10; or mild, moderate, severe)  - MILD (1-3): doesn't interfere with normal activities.   - MODERATE (4-7): interferes with normal activities (e.g., work or school) or awakens from sleep, limping.   - SEVERE (8-10): excruciating pain, unable to do any normal activities, unable to walk.       9/10 4. WORK OR EXERCISE: "Has there been any recent work or exercise that involved this part of the body?"      Yes, I do a lot of walking  5. CAUSE: "What do you think is causing the foot pain?"     I took on a part time job and been walking more  6. OTHER SYMPTOMS: "Do you have any other symptoms?" (e.g., leg pain, rash, fever, numbness)     Numbness in the toes, shooting pain, cold to touch  Protocols used: Foot Pain-A-AH

## 2023-07-18 ENCOUNTER — Ambulatory Visit (INDEPENDENT_AMBULATORY_CARE_PROVIDER_SITE_OTHER): Payer: No Typology Code available for payment source | Admitting: *Deleted

## 2023-07-18 DIAGNOSIS — J309 Allergic rhinitis, unspecified: Secondary | ICD-10-CM | POA: Diagnosis not present

## 2023-07-20 ENCOUNTER — Other Ambulatory Visit: Payer: Self-pay

## 2023-07-20 ENCOUNTER — Ambulatory Visit (INDEPENDENT_AMBULATORY_CARE_PROVIDER_SITE_OTHER): Payer: No Typology Code available for payment source | Admitting: Internal Medicine

## 2023-07-20 ENCOUNTER — Encounter: Payer: Self-pay | Admitting: Internal Medicine

## 2023-07-20 VITALS — BP 128/76 | HR 83 | Ht 73.0 in | Wt 218.9 lb

## 2023-07-20 DIAGNOSIS — E781 Pure hyperglyceridemia: Secondary | ICD-10-CM | POA: Diagnosis not present

## 2023-07-20 DIAGNOSIS — R200 Anesthesia of skin: Secondary | ICD-10-CM

## 2023-07-20 DIAGNOSIS — R202 Paresthesia of skin: Secondary | ICD-10-CM | POA: Diagnosis not present

## 2023-07-20 MED ORDER — FENOFIBRATE 48 MG PO TABS: 48.0000 mg | ORAL_TABLET | Freq: Every day | ORAL | 1 refills | Status: DC

## 2023-07-20 MED ORDER — DULOXETINE HCL 20 MG PO CPEP: 20.0000 mg | ORAL_CAPSULE | Freq: Every day | ORAL | 1 refills | Status: DC

## 2023-07-20 NOTE — Progress Notes (Signed)
 Acute Office Visit  Subjective:     Patient ID: William Hicks, male    DOB: 10/17/1950, 73 y.o.   MRN: [id number]  Chief Complaint  Patient presents with   Toe Pain    Bilateral toe numbness getting worse    Toe Pain  Associated symptoms include tingling.   Patient is in today for numbness and tingling in distal toes bilaterally. Started back in 2011 when he started Leuprolide treatment for prostate cancer but it has been worsening lately.   NUMBNESS Duration: chronic Onset: gradual Location: distal toes, worse in the first and second digits b/l Bilateral: yes Symmetric: yes Decreased sensation: yes  Weakness: no Pain: yes Quality:  shooting Severity: severe  Frequency: constant Trauma: no Recent illness: no Diabetes: no Thyroid disease: no  HIV: no  Alcoholism: no - drinks about 2 beers a week Spinal cord injury: no Alleviating factors: nothing  Aggravating factors: nothing in particular but worse at night, no change with weight bearing or activity Status: worse Treatments attempted: epsom salt soaks   Review of Systems  Constitutional:  Negative for chills and fever.  Skin:  Negative for itching and rash.  Neurological:  Positive for tingling and sensory change. Negative for weakness.        Objective:    BP 128/76 (Cuff Size: Large)   Pulse 83   Ht 6\' 1"  (1.854 m)   Wt 218 lb 14.4 oz (99.3 kg)   BMI 28.88 kg/m  BP Readings from Last 3 Encounters:  07/20/23 128/76  07/11/23 138/70  06/16/23 (!) 142/60   Wt Readings from Last 3 Encounters:  07/20/23 218 lb 14.4 oz (99.3 kg)  06/16/23 217 lb 8 oz (98.7 kg)  03/03/23 214 lb 6.4 oz (97.3 kg)      Physical Exam Constitutional:      Appearance: Normal appearance.  HENT:     Head: Normocephalic and atraumatic.  Eyes:     Conjunctiva/sclera: Conjunctivae normal.  Cardiovascular:     Rate and Rhythm: Normal rate and regular rhythm.     Pulses:          Dorsalis pedis pulses are 2+ on  the right side and 2+ on the left side.  Pulmonary:     Effort: Pulmonary effort is normal.     Breath sounds: Normal breath sounds.  Musculoskeletal:     Right lower leg: No edema.     Left lower leg: No edema.     Right foot: Normal range of motion. No deformity, bunion, Charcot foot, foot drop or prominent metatarsal heads.     Left foot: Normal range of motion. No deformity, bunion, Charcot foot, foot drop or prominent metatarsal heads.  Feet:     Right foot:     Protective Sensation: 8 sites tested.  8 sites sensed.     Skin integrity: Skin integrity normal.     Toenail Condition: Right toenails are normal.     Left foot:     Protective Sensation: 8 sites tested.  8 sites sensed.     Skin integrity: Skin integrity normal.     Toenail Condition: Left toenails are normal.     Comments: Intact sensation but decreased ability in first and second digits, worse distally  Skin:    General: Skin is warm and dry.  Neurological:     General: No focal deficit present.     Mental Status: He is alert. Mental status is at baseline.  Psychiatric:  Mood and Affect: Mood normal.        Behavior: Behavior normal.     No results found for any visits on 07/20/23.      Assessment & Plan:   1. 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 Uncertain if Leuprolide is playing a role in symptoms. Disucssed that if labs are normal, will pursue EMG. Discussed treatment options, patient had been on Gabapentin previously which caused some depression/mood side effects. Willing to try low dose Cymbalta, will recheck in 6 weeks.   - TSH - B12 and Folate Panel - CBC w/Diff/Platelet - Vitamin B1 - DULoxetine (CYMBALTA) 20 MG capsule; Take 1 capsule (20 mg total) by mouth daily.  Dispense: 30 capsule; Refill: 1 - HgB A1c  2. Hypertriglyceridemia: Tricor refilled.   -  fenofibrate (TRICOR) 48 MG tablet; Take 1 tablet (48 mg total) by mouth daily.  Dispense: 90 tablet; Refill: 1   Return in about 6 weeks (around 08/31/2023).  Margarita Mail, DO

## 2023-07-26 LAB — VITAMIN B1: Vitamin B1 (Thiamine): 14 nmol/L (ref 8–30)

## 2023-07-26 LAB — CBC WITH DIFFERENTIAL/PLATELET
Absolute Lymphocytes: 914 {cells}/uL (ref 850–3900)
Absolute Monocytes: 284 {cells}/uL (ref 200–950)
Basophils Absolute: 32 {cells}/uL (ref 0–200)
Basophils Relative: 0.7 %
Eosinophils Absolute: 252 {cells}/uL (ref 15–500)
Eosinophils Relative: 5.6 %
HCT: 38.7 % (ref 38.5–50.0)
Hemoglobin: 12.4 g/dL — ABNORMAL LOW (ref 13.2–17.1)
MCH: 24.4 pg — ABNORMAL LOW (ref 27.0–33.0)
MCHC: 32 g/dL (ref 32.0–36.0)
MCV: 76 fL — ABNORMAL LOW (ref 80.0–100.0)
MPV: 10.8 fL (ref 7.5–12.5)
Monocytes Relative: 6.3 %
Neutro Abs: 3020 {cells}/uL (ref 1500–7800)
Neutrophils Relative %: 67.1 %
Platelets: 199 10*3/uL (ref 140–400)
RBC: 5.09 10*6/uL (ref 4.20–5.80)
RDW: 13.4 % (ref 11.0–15.0)
Total Lymphocyte: 20.3 %
WBC: 4.5 10*3/uL (ref 3.8–10.8)

## 2023-07-26 LAB — TSH: TSH: 0.8 m[IU]/L (ref 0.40–4.50)

## 2023-07-26 LAB — HEMOGLOBIN A1C
Hgb A1c MFr Bld: 5.9 %{Hb} — ABNORMAL HIGH (ref <5.7)
Mean Plasma Glucose: 123 mg/dL
eAG (mmol/L): 6.8 mmol/L

## 2023-07-26 LAB — B12 AND FOLATE PANEL
Folate: 10.6 ng/mL
Vitamin B-12: 275 pg/mL (ref 200–1100)

## 2023-07-27 ENCOUNTER — Ambulatory Visit: Payer: Self-pay | Admitting: Internal Medicine

## 2023-07-27 ENCOUNTER — Telehealth (INDEPENDENT_AMBULATORY_CARE_PROVIDER_SITE_OTHER): Payer: Self-pay

## 2023-07-27 NOTE — Telephone Encounter (Signed)
 Not Dr Leighton Roach patient

## 2023-08-15 ENCOUNTER — Ambulatory Visit (INDEPENDENT_AMBULATORY_CARE_PROVIDER_SITE_OTHER)

## 2023-08-15 DIAGNOSIS — J309 Allergic rhinitis, unspecified: Secondary | ICD-10-CM

## 2023-08-31 ENCOUNTER — Ambulatory Visit: Payer: No Typology Code available for payment source | Admitting: Internal Medicine

## 2023-08-31 ENCOUNTER — Other Ambulatory Visit: Payer: Self-pay

## 2023-08-31 ENCOUNTER — Encounter: Payer: Self-pay | Admitting: Internal Medicine

## 2023-08-31 VITALS — BP 130/76 | HR 86 | Temp 97.8°F | Resp 18 | Ht 73.0 in | Wt 218.6 lb

## 2023-08-31 DIAGNOSIS — Z23 Encounter for immunization: Secondary | ICD-10-CM

## 2023-08-31 DIAGNOSIS — R200 Anesthesia of skin: Secondary | ICD-10-CM | POA: Diagnosis not present

## 2023-08-31 DIAGNOSIS — R202 Paresthesia of skin: Secondary | ICD-10-CM

## 2023-08-31 MED ORDER — DULOXETINE HCL 20 MG PO CPEP: 20.0000 mg | ORAL_CAPSULE | Freq: Every day | ORAL | 1 refills | Status: DC

## 2023-08-31 NOTE — Progress Notes (Signed)
   Established Office Visit  Subjective:     Patient ID: William Hicks, male    DOB: 24-Nov-1950, 73 y.o.   MRN: [id number]  Chief Complaint  Patient presents with   Follow-up    6 week recheck    Toe Pain    Patient is in today for follow up on numbness and tingling in distal toes bilaterally. Started back in 2011 when he started Leuprolide treatment for prostate cancer but it has been worsening lately. Had labs done which were significant for mild anemia with a hemoglobin of 12.4. Vitamin B1 and B12 were both on the lower end of normal. TSH normal, A1c 5.9%. He was advised to start both iron and B complex vitamins which he has been taking. We also started Cymbalta 20 mg daily at our LOV which he states has been helping.   Health Maintenance: -Blood work UTD -Colon cancer screening: colonoscopy 11/23, repeat in 3 years (tubular adenomas)   Review of Systems  All other systems reviewed and are negative.       Objective:    BP 130/76 (Cuff Size: Large)   Pulse 86   Temp 97.8 F (36.6 C) (Oral)   Resp 18   Ht 6\' 1"  (1.854 m)   Wt 218 lb 9.6 oz (99.2 kg)   SpO2 99%   BMI 28.84 kg/m  BP Readings from Last 3 Encounters:  08/31/23 130/76  07/20/23 128/76  07/11/23 138/70   Wt Readings from Last 3 Encounters:  08/31/23 218 lb 9.6 oz (99.2 kg)  07/20/23 218 lb 14.4 oz (99.3 kg)  06/16/23 217 lb 8 oz (98.7 kg)      Physical Exam Constitutional:      Appearance: Normal appearance.  HENT:     Head: Normocephalic and atraumatic.  Eyes:     Conjunctiva/sclera: Conjunctivae normal.  Cardiovascular:     Rate and Rhythm: Normal rate and regular rhythm.  Pulmonary:     Effort: Pulmonary effort is normal.     Breath sounds: Normal breath sounds.  Skin:    General: Skin is warm and dry.  Neurological:     General: No focal deficit present.     Mental Status: He is alert. Mental status is at baseline.  Psychiatric:        Mood and Affect: Mood normal.         Behavior: Behavior normal.     No results found for any visits on 08/31/23.      Assessment & Plan:   1. Numbness and tingling of both feet (Primary): Reviewed labs results with the patient, neuropathy could be multifactorial, including previous chemotherapy, mild anemia, Vitamin B1/B12 deficiencies and pre-diabetes. Will continue PO iron and B complex vitamin as well as decrease sugar and carbs in the diet. Cymbalta helping so far, will continue 20 mg dose and refill today.  - DULoxetine (CYMBALTA) 20 MG capsule; Take 1 capsule (20 mg total) by mouth daily.  Dispense: 90 capsule; Refill: 1  2. Need for pneumococcal vaccine: Prevnar 20 administered today.   - Pneumococcal conjugate vaccine 20-valent (Prevnar 20)   Return in about 6 months (around 03/01/2024).  Margarita Mail, DO

## 2023-09-09 DIAGNOSIS — M25542 Pain in joints of left hand: Secondary | ICD-10-CM | POA: Diagnosis not present

## 2023-09-09 DIAGNOSIS — M67442 Ganglion, left hand: Secondary | ICD-10-CM | POA: Diagnosis not present

## 2023-09-13 ENCOUNTER — Ambulatory Visit (INDEPENDENT_AMBULATORY_CARE_PROVIDER_SITE_OTHER)

## 2023-09-13 DIAGNOSIS — J309 Allergic rhinitis, unspecified: Secondary | ICD-10-CM | POA: Diagnosis not present

## 2023-09-29 ENCOUNTER — Telehealth: Payer: Self-pay | Admitting: Allergy & Immunology

## 2023-09-29 NOTE — Telephone Encounter (Signed)
 LVM and informed pt that a new authorization has been sent in and that he call [facility] for a new authorization as well.

## 2023-10-03 ENCOUNTER — Telehealth: Payer: Self-pay

## 2023-10-03 NOTE — Telephone Encounter (Signed)
 Copied from CRM [id number]. Topic: General - Other >> Sep 12, 2023  1:27 PM Everette C wrote: Reason for CRM: The patient has called to request that their most recent Lab work from 07/20/23 please be faxed to [place] c/o Dr. Hiram Lukes at [phone]  Please contact the patient further if needed >> Oct 03, 2023 12:51 PM Crispin Dolphin wrote: [place] Nurse from [facility] called back. States pt is trying to get fenofibrate  (TRICOR ) 48 MG tablet and atorvastatin  (LIPITOR ) 40 MG tablet filled through [place]. In order to fill they need most recent office visit note that includes medication prescribed. States she received lab results but they need office note with list of medications. Can Fax: [phone]. Thank You

## 2023-10-04 NOTE — Telephone Encounter (Signed)
 Labs faxed

## 2023-10-04 NOTE — Telephone Encounter (Signed)
 Pt new authorization has been added to the chart

## 2023-10-05 NOTE — Telephone Encounter (Signed)
 Copied from CRM [id number]. Topic: General - Other >> Oct 05, 2023  9:24 AM Alpha Arts wrote: Reason for CRM: Nurse [place] from [facility] is requesting the Last office visit notes for patient pertaining the listed medications. She received the labs last time and does not want that.  Medications: fenofibrate  (TRICOR ) 48 MG tablet atorvastatin  (LIPITOR ) 40 MG tablet  Callback #: [phone] ext [phone] Fax #: [phone]

## 2023-10-05 NOTE — Telephone Encounter (Signed)
Last ov sent

## 2023-10-11 ENCOUNTER — Ambulatory Visit (INDEPENDENT_AMBULATORY_CARE_PROVIDER_SITE_OTHER)

## 2023-10-11 DIAGNOSIS — J309 Allergic rhinitis, unspecified: Secondary | ICD-10-CM

## 2023-10-24 DIAGNOSIS — J3089 Other allergic rhinitis: Secondary | ICD-10-CM | POA: Diagnosis not present

## 2023-10-24 NOTE — Progress Notes (Signed)
 VIAL MADE 10-24-23

## 2023-11-02 DIAGNOSIS — J0140 Acute pansinusitis, unspecified: Secondary | ICD-10-CM | POA: Diagnosis not present

## 2023-11-09 ENCOUNTER — Ambulatory Visit (INDEPENDENT_AMBULATORY_CARE_PROVIDER_SITE_OTHER)

## 2023-11-09 DIAGNOSIS — J309 Allergic rhinitis, unspecified: Secondary | ICD-10-CM

## 2023-12-08 ENCOUNTER — Ambulatory Visit

## 2023-12-08 DIAGNOSIS — J309 Allergic rhinitis, unspecified: Secondary | ICD-10-CM | POA: Diagnosis not present

## 2023-12-15 ENCOUNTER — Ambulatory Visit (INDEPENDENT_AMBULATORY_CARE_PROVIDER_SITE_OTHER)

## 2023-12-15 ENCOUNTER — Ambulatory Visit: Payer: No Typology Code available for payment source | Admitting: Allergy & Immunology

## 2023-12-15 DIAGNOSIS — J309 Allergic rhinitis, unspecified: Secondary | ICD-10-CM | POA: Diagnosis not present

## 2023-12-22 ENCOUNTER — Ambulatory Visit (INDEPENDENT_AMBULATORY_CARE_PROVIDER_SITE_OTHER)

## 2023-12-22 DIAGNOSIS — J309 Allergic rhinitis, unspecified: Secondary | ICD-10-CM | POA: Diagnosis not present

## 2023-12-27 ENCOUNTER — Encounter: Payer: Self-pay | Admitting: Allergy & Immunology

## 2023-12-27 ENCOUNTER — Other Ambulatory Visit: Payer: Self-pay

## 2023-12-27 ENCOUNTER — Ambulatory Visit (INDEPENDENT_AMBULATORY_CARE_PROVIDER_SITE_OTHER): Admitting: Allergy & Immunology

## 2023-12-27 VITALS — BP 112/62 | HR 80 | Temp 98.3°F | Resp 18 | Ht 71.5 in | Wt 217.6 lb

## 2023-12-27 DIAGNOSIS — J302 Other seasonal allergic rhinitis: Secondary | ICD-10-CM | POA: Diagnosis not present

## 2023-12-27 DIAGNOSIS — J4520 Mild intermittent asthma, uncomplicated: Secondary | ICD-10-CM | POA: Diagnosis not present

## 2023-12-27 DIAGNOSIS — J3089 Other allergic rhinitis: Secondary | ICD-10-CM | POA: Diagnosis not present

## 2023-12-27 DIAGNOSIS — K219 Gastro-esophageal reflux disease without esophagitis: Secondary | ICD-10-CM | POA: Diagnosis not present

## 2023-12-27 NOTE — Patient Instructions (Addendum)
 1. Seasonal and perennial allergic rhinitis (grasses and dust mites) - Continue with: Astelin  one spray per nostril up to twice daily (AIM FOR EAR ON EACH SIDE) - Continue with: Flonase  (fluticasone ) one spray per nostril TWICE daily (AIM FOR EAR ON EACH SIDE) - You can use an extra dose of the antihistamine, if needed, for breakthrough symptoms.  - Consider nasal saline rinses 1-2 times daily to remove allergens from the nasal cavities as well as help with mucous clearance (this is especially helpful to do before the nasal sprays are given) - Continue with allergy  shots at the same schedule.  - We will continue allergy  shots through January 2029.  2. GERD - Continue with pantoprazole  1-2 times daily.   3. Intermittent asthma, uncomplicated - Lung testing looks good today. - We are not going to make any changes today.   4. Return in about 6 months (around 06/28/2024). You can have the follow up appointment with Dr. Iva or a Nurse Practicioner (our Nurse Practitioners are excellent and always have Physician oversight!).    Please inform us  of any Emergency Department visits, hospitalizations, or changes in symptoms. Call us  before going to the ED for breathing or allergy  symptoms since we might be able to fit you in for a sick visit. Feel free to contact us  anytime with any questions, problems, or concerns.  It was a pleasure to see you again today!  Websites that have reliable patient information: 1. American Academy of Asthma, Allergy , and Immunology: www.aaaai.org 2. Food Allergy  Research and Education (FARE): foodallergy.org 3. Mothers of Asthmatics: http://www.asthmacommunitynetwork.org 4. [employer] of Allergy , Asthma, and Immunology: www.acaai.org      "Like" us  on Facebook and Instagram for our latest updates!      A healthy democracy works best when [employer] participate! Make sure you are registered to vote! If you have moved or changed any of your contact  information, you will need to get this updated before voting! Scan the QR codes below to learn more!
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
## 2024-01-02 NOTE — Progress Notes (Signed)
 Pharmacy Quality Measure Review  This patient is appearing on a report for being at risk of failing the adherence measure for cholesterol (statin) medications this calendar year.   Medication: atorvastatin  40 mg  Last fill date: 11/13/23 for 90 day supply  Insurance report was not up to date. No action needed at this time.   Abdoulie Tierce E. Marsh, PharmD Clinical Pharmacist [facility] [phone]

## 2024-01-24 ENCOUNTER — Ambulatory Visit (INDEPENDENT_AMBULATORY_CARE_PROVIDER_SITE_OTHER)

## 2024-01-24 DIAGNOSIS — J309 Allergic rhinitis, unspecified: Secondary | ICD-10-CM

## 2024-02-10 ENCOUNTER — Telehealth: Payer: Self-pay

## 2024-02-10 ENCOUNTER — Telehealth: Payer: Self-pay | Admitting: Allergy & Immunology

## 2024-02-10 NOTE — Telephone Encounter (Signed)
 Please add to William Hicks's schedule for a televisit.

## 2024-02-10 NOTE — Telephone Encounter (Signed)
 Patient decline visit prior to putting in previous visit and does not have an active MyChart.

## 2024-02-10 NOTE — Telephone Encounter (Signed)
 Patient called and requested if he could have a medication called in as he believes he is having a sinus infection. He is requesting for Amoxicillin  in the liquid form to be called in and sent over to [facility] on [address]   Best Contact: [phone]

## 2024-02-10 NOTE — Telephone Encounter (Signed)
 Left message for patient

## 2024-02-10 NOTE — Telephone Encounter (Signed)
 Together correct?

## 2024-02-10 NOTE — Telephone Encounter (Signed)
 Copied from CRM #[id number]. Topic: Clinical - Medication Question >> Feb 10, 2024  8:12 AM Anairis L wrote: Reason for CRM: Patient would like to know if he can't take fenofibrate  (TRICOR ) 48 MG and atorvastatin  (LIPITOR ) 40 MG tablet together or separate during the day.   Please give patient a call back.

## 2024-02-14 ENCOUNTER — Ambulatory Visit (INDEPENDENT_AMBULATORY_CARE_PROVIDER_SITE_OTHER): Admitting: Allergy & Immunology

## 2024-02-14 ENCOUNTER — Other Ambulatory Visit: Payer: Self-pay

## 2024-02-14 ENCOUNTER — Encounter: Payer: Self-pay | Admitting: Allergy & Immunology

## 2024-02-14 VITALS — BP 132/84 | HR 86 | Temp 98.3°F | Resp 14 | Ht 73.0 in | Wt 214.2 lb

## 2024-02-14 DIAGNOSIS — K219 Gastro-esophageal reflux disease without esophagitis: Secondary | ICD-10-CM

## 2024-02-14 DIAGNOSIS — J4520 Mild intermittent asthma, uncomplicated: Secondary | ICD-10-CM

## 2024-02-14 DIAGNOSIS — J0100 Acute maxillary sinusitis, unspecified: Secondary | ICD-10-CM

## 2024-02-14 DIAGNOSIS — J3089 Other allergic rhinitis: Secondary | ICD-10-CM

## 2024-02-14 DIAGNOSIS — J302 Other seasonal allergic rhinitis: Secondary | ICD-10-CM

## 2024-02-14 MED ORDER — METHYLPREDNISOLONE ACETATE 40 MG/ML IJ SUSP
40.0000 mg | Freq: Once | INTRAMUSCULAR | Status: AC
  Administered 2024-02-14: 40 mg via INTRAMUSCULAR

## 2024-02-14 MED ORDER — PREDNISONE 10 MG PO TABS: ORAL_TABLET | ORAL | 0 refills | Status: DC

## 2024-02-14 NOTE — Progress Notes (Signed)
 FOLLOW UP  Date of Service/Encounter:  02/14/24   Assessment:   Mild intermittent asthma, uncomplicated - well controlled   Seasonal and perennial allergic rhinitis - currently on immunotherapy with maintenance reached January 2024    Recent stroke - no longer on blood thinners    Right-sided tinnitus with throbbing  Plan/Recommendations:   Patient Instructions  1. Seasonal and perennial allergic rhinitis (grasses and dust mites) - Continue with: Astelin  one spray per nostril up to twice daily (AIM FOR EAR ON EACH SIDE) - Continue with: Flonase  (fluticasone ) one spray per nostril TWICE daily (AIM FOR EAR ON EACH SIDE) - You can use an extra dose of the antihistamine, if needed, for breakthrough symptoms.  - Consider nasal saline rinses 1-2 times daily to remove allergens from the nasal cavities as well as help with mucous clearance (this is especially helpful to do before the nasal sprays are given) - Continue with allergy  shots at the same schedule.  - We will continue allergy  shots through January 2029.  2. Sinusitis - viral versus allergic triggered - Start the prednisone  pack provided. - Call us  in TWO DAYS or MYCHART with an update. - We can send in antibiotics if this is not doing the trick.  - Add on liquid Mucinex twice daily     3. GERD - Continue with pantoprazole  1-2 times daily.   4. Intermittent asthma, uncomplicated - Lung testing not done today a tall.  - We are not going to make any changes today.   5. Follow up in 5 months or earlier if needed.    Please inform us  of any Emergency Department visits, hospitalizations, or changes in symptoms. Call us  before going to the ED for breathing or allergy  symptoms since we might be able to fit you in for a sick visit. Feel free to contact us  anytime with any questions, problems, or concerns.  It was a pleasure to see you again today!  Websites that have reliable patient information: 1. American Academy of  Asthma, Allergy , and Immunology: www.aaaai.org 2. Food Allergy  Research and Education (FARE): foodallergy.org 3. Mothers of Asthmatics: http://www.asthmacommunitynetwork.org 4. American College of Allergy , Asthma, and Immunology: www.acaai.org      "Like" us  on Facebook and Instagram for our latest updates!      A healthy democracy works best when [employer] participate! Make sure you are registered to vote! If you have moved or changed any of your contact information, you will need to get this updated before voting! Scan the QR codes below to learn more!            Subjective:   RACHEL SAMPLES is a 73 y.o. male presenting today for follow up of  Chief Complaint  Patient presents with   Follow-up    He states he has been having congestion on cheeks and forehead. Right ear is pressured and itchy eyes. He says it was worst at night.     EZRAEL SAM has a history of the following: Patient Active Problem List   Diagnosis Date Noted   Anxiety disorder 02/18/2022   History of malignant neoplasm of prostate 10/07/2021   History of CVA (cerebrovascular accident) without residual deficits 10/07/2021   Mild intermittent asthma without complication 10/07/2021   Prediabetes 10/07/2021   Environmental allergies 10/07/2021   Anticoagulated by anticoagulation treatment 09/16/2020   CVA (cerebral vascular accident) (HCC) 09/09/2020   Cluster headache 09/09/2020   Acute CVA (cerebrovascular accident) (HCC) 09/09/2020   Malignant neoplasm  of prostate (HCC) 05/06/2015   Degenerative arthritis of hip 11/14/2013   HLD (hyperlipidemia) 09/25/2012   GERD (gastroesophageal reflux disease) 08/17/2011   Abscess, gluteal, right 12/22/2010   Abscess, gluteal 12/18/2010   HEARTBURN 10/30/2008   History of colonic polyps 10/30/2008    History obtained from: chart review and patient.  Discussed the use of AI scribe software for clinical note transcription with the patient  and/or guardian, who gave verbal consent to proceed.  Conrad is a 73 y.o. male presenting for a sick visit.  He was last seen in August 2025.  At that time, we continue with Astelin  and Flonase  as well as allergy  shots.  For his GERD, we continue with pantoprazole .  Lung testing looked great.  In the interim, he has done well. He has been experiencing sinus issues for approximately two weeks following exposure to wet conditions at a wedding. Symptoms include nasal congestion and sinus pressure, which have somewhat eased since onset. No fever is present.Since last visit, he reports that he has had worsening sinus congestion. It started two weeks ago. He has been treating it with all of the nose sprays that we have him on. He has not used any OTC cold medications. He does not use Mucinex. He cannot swallow pills so he has not tried Mucinex. He denies fevers.   He has been using nasal sprays and saline rinses twice daily, once in the morning and once at night, to manage his symptoms. Over-the-counter medications like Robitussin have not provided relief, and he avoids Mucinex tablets due to difficulty swallowing pills.  In February of this year, he experienced a similar episode and was treated with prednisone . He recalls being prescribed antibiotics in the past, specifically amoxicillin  with clavulanic acid, but has not used any antibiotics this year. Last one that we prescribed was July 2024.   He has a history of reflux, which is controlled with medication, but notes that if he stops the medication, symptoms return. He also reports occasional difficulty swallowing food, in addition to pills, and has seen a GI doctor who performed a scope and noted a 'hump' in his throat.     Otherwise, there have been no changes to his past medical history, surgical history, family history, or social history.    Review of systems otherwise negative other than that mentioned in the HPI.    Objective:   Blood  pressure 132/84, pulse 86, temperature 98.3 F (36.8 C), temperature source Temporal, resp. rate 14, height 6' 1 (1.854 m), weight 214 lb 3.2 oz (97.2 kg), SpO2 97%. Body mass index is 28.26 kg/m.    Physical Exam Vitals reviewed.  Constitutional:      Appearance: He is well-developed.     Comments: Pleasant.  Cooperative. Friendly.   HENT:     Head: Normocephalic and atraumatic.     Right Ear: Tympanic membrane, ear canal and external ear normal. No drainage, swelling or tenderness. Tympanic membrane is not injected, scarred, erythematous, retracted or bulging.     Left Ear: Tympanic membrane, ear canal and external ear normal. No drainage, swelling or tenderness. Tympanic membrane is not injected, scarred, erythematous, retracted or bulging.     Ears:     Comments: Nasal sounding voice.    Nose: No nasal deformity, septal deviation, mucosal edema or rhinorrhea.     Right Turbinates: Enlarged, swollen and pale.     Left Turbinates: Enlarged, swollen and pale.     Right Sinus: Maxillary sinus tenderness present. No  frontal sinus tenderness.     Left Sinus: Maxillary sinus tenderness present. No frontal sinus tenderness.     Comments: No polyps. Mild sinus pain    Mouth/Throat:     Mouth: Mucous membranes are not pale and not dry.     Pharynx: Uvula midline.  Eyes:     General: Allergic shiner present.        Right eye: No discharge.        Left eye: No discharge.     Conjunctiva/sclera: Conjunctivae normal.     Right eye: Right conjunctiva is not injected. No chemosis.    Left eye: Left conjunctiva is not injected. No chemosis.    Pupils: Pupils are equal, round, and reactive to light.  Cardiovascular:     Rate and Rhythm: Normal rate and regular rhythm.     Heart sounds: Normal heart sounds.  Pulmonary:     Effort: Pulmonary effort is normal. No tachypnea, accessory muscle usage or respiratory distress.     Breath sounds: Normal breath sounds. No wheezing, rhonchi or rales.   Chest:     Chest wall: No tenderness.  Abdominal:     Tenderness: There is no abdominal tenderness. There is no guarding or rebound.  Lymphadenopathy:     Head:     Right side of head: No submandibular, tonsillar or occipital adenopathy.     Left side of head: No submandibular, tonsillar or occipital adenopathy.     Cervical: No cervical adenopathy.  Skin:    Coloration: Skin is not pale.     Findings: No abrasion, erythema, petechiae or rash. Rash is not papular, urticarial or vesicular.  Neurological:     Mental Status: He is alert.  Psychiatric:        Behavior: Behavior is cooperative.      Diagnostic studies: none      Marty Shaggy, MD  [facility] 

## 2024-02-14 NOTE — Patient Instructions (Addendum)
 1. Seasonal and perennial allergic rhinitis (grasses and dust mites) - Continue with: Astelin  one spray per nostril up to twice daily (AIM FOR EAR ON EACH SIDE) - Continue with: Flonase  (fluticasone ) one spray per nostril TWICE daily (AIM FOR EAR ON EACH SIDE) - You can use an extra dose of the antihistamine, if needed, for breakthrough symptoms.  - Consider nasal saline rinses 1-2 times daily to remove allergens from the nasal cavities as well as help with mucous clearance (this is especially helpful to do before the nasal sprays are given) - Continue with allergy  shots at the same schedule.  - We will continue allergy  shots through January 2029.  2. Sinusitis - viral versus allergic triggered - Start the prednisone  pack provided. - Call us  in TWO DAYS or MYCHART with an update. - We can send in antibiotics if this is not doing the trick.  - Add on liquid Mucinex twice daily     3. GERD - Continue with pantoprazole  1-2 times daily.   4. Intermittent asthma, uncomplicated - Lung testing not done today a tall.  - We are not going to make any changes today.   5. Follow up in 5 months or earlier if needed.    Please inform us  of any Emergency Department visits, hospitalizations, or changes in symptoms. Call us  before going to the ED for breathing or allergy  symptoms since we might be able to fit you in for a sick visit. Feel free to contact us  anytime with any questions, problems, or concerns.  It was a pleasure to see you again today!  Websites that have reliable patient information: 1. American Academy of Asthma, Allergy , and Immunology: www.aaaai.org 2. Food Allergy  Research and Education (FARE): foodallergy.org 3. Mothers of Asthmatics: http://www.asthmacommunitynetwork.org 4. American College of Allergy , Asthma, and Immunology: www.acaai.org      "Like" us  on Facebook and Instagram for our latest updates!      A healthy democracy works best when [employer] participate!  Make sure you are registered to vote! If you have moved or changed any of your contact information, you will need to get this updated before voting! Scan the QR codes below to learn more!

## 2024-02-23 ENCOUNTER — Ambulatory Visit

## 2024-02-23 DIAGNOSIS — J309 Allergic rhinitis, unspecified: Secondary | ICD-10-CM | POA: Diagnosis not present

## 2024-03-06 ENCOUNTER — Encounter: Admitting: Internal Medicine

## 2024-03-07 ENCOUNTER — Ambulatory Visit: Admitting: Internal Medicine

## 2024-03-07 ENCOUNTER — Other Ambulatory Visit: Payer: Self-pay

## 2024-03-07 ENCOUNTER — Encounter: Payer: Self-pay | Admitting: Internal Medicine

## 2024-03-07 VITALS — BP 134/82 | HR 94 | Temp 98.4°F | Resp 16 | Ht 73.0 in | Wt 213.6 lb

## 2024-03-07 DIAGNOSIS — D649 Anemia, unspecified: Secondary | ICD-10-CM

## 2024-03-07 DIAGNOSIS — E781 Pure hyperglyceridemia: Secondary | ICD-10-CM

## 2024-03-07 DIAGNOSIS — R7303 Prediabetes: Secondary | ICD-10-CM

## 2024-03-07 DIAGNOSIS — C61 Malignant neoplasm of prostate: Secondary | ICD-10-CM

## 2024-03-07 DIAGNOSIS — E782 Mixed hyperlipidemia: Secondary | ICD-10-CM

## 2024-03-07 DIAGNOSIS — Z23 Encounter for immunization: Secondary | ICD-10-CM

## 2024-03-07 DIAGNOSIS — K219 Gastro-esophageal reflux disease without esophagitis: Secondary | ICD-10-CM

## 2024-03-07 DIAGNOSIS — R200 Anesthesia of skin: Secondary | ICD-10-CM

## 2024-03-07 DIAGNOSIS — R202 Paresthesia of skin: Secondary | ICD-10-CM

## 2024-03-07 MED ORDER — PANTOPRAZOLE SODIUM 40 MG PO TBEC: DELAYED_RELEASE_TABLET | ORAL | 3 refills | Status: DC

## 2024-03-07 MED ORDER — ATORVASTATIN CALCIUM 40 MG PO TABS: 40.0000 mg | ORAL_TABLET | Freq: Every day | ORAL | 3 refills | Status: AC

## 2024-03-07 MED ORDER — FENOFIBRATE 48 MG PO TABS: 48.0000 mg | ORAL_TABLET | Freq: Every day | ORAL | 3 refills | Status: AC

## 2024-03-07 NOTE — Progress Notes (Signed)
 Established Patient Office Visit  Subjective    Patient ID: William Hicks, male    DOB: [birth year]  Age: 73 y.o. MRN: [id number]  CC:  Chief Complaint  Patient presents with   Medical Management of Chronic Issues    HPI William Hicks presents to follow up on chronic medical conditions.   Discussed the use of AI scribe software for clinical note transcription with the patient, who gave verbal consent to proceed.  History of Present Illness William Hicks is a 73 year old male with neuropathy who presents with persistent numbness in his feet.  He experiences persistent numbness in his feet without significant change. Previous treatments with duloxetine , gabapentin, and pregabalin were discontinued due to increased depression and emotional distress. Neuropathy symptoms began with hormone therapy for prostate cancer. He is uncertain if this is related.  He is under urologist care for prostate cancer with regular follow-ups. His last PSA was 0.15. Current medications include atorvastatin  and fenofibrate  for cholesterol, and pantoprazole  40 mg daily for acid reflux. He requests refills for cholesterol medications.  No abnormal bleeding in stools or urine.   Hx of CVA/HLD: -Hx of CVA in 4/22 ischemia stroke, was hospitalized at [facility] at the time  -Follows with Neurology through the [facility] -Medications: Lipitor  40 mg, Tricor  48 mg, Plavix  75. No abnormal bleeding, no hematuria or blood in his stools  -Patient is compliant with above medications and reports no side effects.  -Last lipid panel: Lipid Panel     Component Value Date/Time   CHOL 166 01/27/2023 1535   CHOL 161 06/16/2021 1129   TRIG 237 (H) 01/27/2023 1535   HDL 55 01/27/2023 1535   HDL 55 06/16/2021 1129   CHOLHDL 3.0 01/27/2023 1535   VLDL 24 09/10/2020 0429   LDLCALC 78 01/27/2023 1535   LABVLDL 30 06/16/2021 1129   Prostate Carcinoma: -Following with Oncology -First diagnosed in 2010, received  treatment with radiation and seed implants  -Currently on Leuprolide injection every 6 months, will see the Oncologist through the [facility] -Last PSA 10/25 0.15  Asthma:  -Asthma status: controlled -Current Treatments: Albuterol PRN -Satisfied with current treatment?: yes -Albuterol/rescue inhaler frequency: haven't had to use in the last 2 weeks, worse with allergies  -Dyspnea frequency: no -Wheezing frequency: no -Cough frequency: no -Nocturnal symptom frequency: no -Visits to ER or Urgent Care in past year: no -Pneumovax: Up to Date -Influenza: Up to Date  Pre-Diabetes: -A1c 5.9% 2/25  GERD: -Currently on Protonix  40 mg daily, having some break through symptoms but he is eating more chocolate lately. Denies vomiting, does have some nausea. Appetite good, weight stable.   Allergies: -Currently using Flonase , Zyrtec  -Seeing ENT, planning on starting allergy  shots soon  Health Maintenance: -Blood work due -Colon cancer screening: colonoscopy 10/24, repeat in 3 years -Flu vaccine today    Outpatient Encounter Medications as of 03/07/2024  Medication Sig   albuterol (VENTOLIN HFA) 108 (90 Base) MCG/ACT inhaler Inhale into the lungs.   atorvastatin  (LIPITOR ) 40 MG tablet Take 1 tablet (40 mg total) by mouth daily.   azelastine  (ASTELIN ) 0.1 % nasal spray Place 1 spray into both nostrils 2 (two) times daily.   cetirizine (ZYRTEC) 10 MG tablet Take 10 mg by mouth.   cromolyn (OPTICROM) 4 % ophthalmic solution 1 drop 4 (four) times daily.   docusate sodium  (COLACE) 50 MG capsule Take 1 capsule (50 mg total) by mouth daily as needed for mild constipation.   fenofibrate  (TRICOR )  48 MG tablet Take 1 tablet (48 mg total) by mouth daily.   fluticasone  (FLONASE ) 50 MCG/ACT nasal spray Place 1 spray into both nostrils in the morning and at bedtime.   ipratropium (ATROVENT ) 0.03 % nasal spray Place 1-2 sprays into both nostrils 2 (two) times daily as needed (nasal drainage).   leuprolide, 6  Month, (LEUPROLIDE ACETATE, 6 MONTH,) 45 MG injection Inject into the skin.   loratadine (CLARITIN) 10 MG tablet Take 10 mg by mouth.   pantoprazole  (PROTONIX ) 40 MG tablet Take 40mg  1-2 times daily.   DULoxetine  (CYMBALTA ) 20 MG capsule Take 1 capsule (20 mg total) by mouth daily. (Patient not taking: Reported on 02/14/2024)   predniSONE  (DELTASONE ) 10 MG tablet Take two tablets (20mg ) twice daily for three days, then one tablet (10mg ) twice daily for three days, then STOP. (Patient not taking: Reported on 03/07/2024)   sucralfate (CARAFATE) 1 g tablet Take 1 g by mouth daily with breakfast. (Patient not taking: Reported on 02/14/2024)   [DISCONTINUED] fluticasone  (VERAMYST) 27.5 MCG/SPRAY nasal spray Place 2 sprays into the nose daily.     No facility-administered encounter medications on file as of 03/07/2024.    Past Medical History:  Diagnosis Date   Arthritis    Asthma    Epistaxis    GERD (gastroesophageal reflux disease)    Kidney stones    Prostate cancer (HCC)    Sinusitis     Past Surgical History:  Procedure Laterality Date   LITHOTRIPSY  2008    Family History  Problem Relation Age of Onset   Heart failure Mother    Alzheimer's disease Father     Social History   Socioeconomic History   Marital status: Married    Spouse name: Not on file   Number of children: Not on file   Years of education: Not on file   Highest education level: Not on file  Occupational History   Not on file  Tobacco Use   Smoking status: Former    Types: Cigarettes   Smokeless tobacco: Never  Vaping Use   Vaping status: Never Used  Substance and Sexual Activity   Alcohol use: Yes    Comment: 2-3 week beer   Drug use: No   Sexual activity: Not Currently  Other Topics Concern   Not on file  Social History Narrative   Not on file   Social Drivers of Health   Financial Resource Strain: Low Risk  (04/07/2023)   Overall Financial Resource Strain (CARDIA)    Difficulty of Paying  Living Expenses: Not hard at all  Food Insecurity: No Food Insecurity (04/07/2023)   Hunger Vital Sign    Worried About Running Out of Food in the Last Year: Never true    Ran Out of Food in the Last Year: Never true  Transportation Needs: No Transportation Needs (04/07/2023)   PRAPARE - Administrator, Civil Service (Medical): No    Lack of Transportation (Non-Medical): No  Physical Activity: Sufficiently Active (04/07/2023)   Exercise Vital Sign    Days of Exercise per Week: 4 days    Minutes of Exercise per Session: 120 min  Stress: No Stress Concern Present (04/07/2023)   [employer] of Occupational Health - Occupational Stress Questionnaire    Feeling of Stress : Not at all  Social Connections: Socially Integrated (04/07/2023)   Social Connection and Isolation Panel    Frequency of Communication with Friends and Family: More than three times a week  Frequency of Social Gatherings with Friends and Family: Twice a week    Attends Religious Services: More than 4 times per year    Active Member of [employer] or Organizations: Yes    Attends Engineer, structural: More than 4 times per year    Marital Status: Married  Catering manager Violence: Not At Risk (04/07/2023)   Humiliation, Afraid, Rape, and Kick questionnaire    Fear of Current or Ex-Partner: No    Emotionally Abused: No    Physically Abused: No    Sexually Abused: No    Review of Systems  Neurological:  Positive for tingling.  All other systems reviewed and are negative.       Objective    BP 134/82   Pulse 94   Temp 98.4 F (36.9 C) (Oral)   Resp 16   Ht 6' 1 (1.854 m)   Wt 213 lb 9.6 oz (96.9 kg)   SpO2 98%   BMI 28.18 kg/m   Physical Exam Constitutional:      Appearance: Normal appearance.  HENT:     Head: Normocephalic and atraumatic.     Mouth/Throat:     Mouth: Mucous membranes are moist.     Pharynx: Oropharynx is clear.  Eyes:     Conjunctiva/sclera: Conjunctivae  normal.  Neck:     Comments: No thyromegaly  Cardiovascular:     Rate and Rhythm: Normal rate and regular rhythm.  Pulmonary:     Effort: Pulmonary effort is normal.     Breath sounds: Normal breath sounds.  Musculoskeletal:     Cervical back: No tenderness.     Right lower leg: No edema.     Left lower leg: No edema.  Lymphadenopathy:     Cervical: No cervical adenopathy.  Skin:    General: Skin is warm and dry.  Neurological:     General: No focal deficit present.     Mental Status: He is alert. Mental status is at baseline.  Psychiatric:        Mood and Affect: Mood normal.        Behavior: Behavior normal.         Assessment & Plan:   Assessment & Plan Peripheral neuropathy Chronic peripheral neuropathy with persistent numbness in feet. Intolerant to duloxetine , gabapentin, and pregabalin due to adverse effects. Possible link to hormone treatment for prostate cancer unconfirmed. Current management focuses on symptom monitoring. - Avoid medications that exacerbate depression.  Anemia Anemia with previous mild anemia. No current symptoms of abnormal bleeding. Potential contribution to numbness if it worsens. - Order complete blood count to assess hemoglobin levels. - Order iron panel to evaluate for iron deficiency.  Hyperlipidemia Chronic hyperlipidemia managed with atorvastatin  and fenofibrate . Medication refills are due. - Refill atorvastatin  90-day supply. - Refill fenofibrate  90-day supply.  Gastroesophageal reflux disease Chronic gastroesophageal reflux disease managed with pantoprazole . Symptoms exacerbated by spicy foods. - Refill pantoprazole  40 mg once daily as needed for acid reflux. - Advise to avoid foods that trigger reflux symptoms.  Prediabetes Prediabetes with A1c in the prediabetic range. - Recheck A1c as part of yearly labs.  Prostate cancer Prostate cancer with recent PSA level of 0.15, indicating well-managed disease. - Continue follow-up  with oncology for prostate cancer management.  - atorvastatin  (LIPITOR ) 40 MG tablet; Take 1 tablet (40 mg total) by mouth daily.  Dispense: 90 tablet; Refill: 3 - Lipid Profile - fenofibrate  (TRICOR ) 48 MG tablet; Take 1 tablet (48 mg total) by mouth  daily.  Dispense: 90 tablet; Refill: 3 - pantoprazole  (PROTONIX ) 40 MG tablet; Take 40 mg once daily as needed for acid reflux.  Dispense: 90 tablet; Refill: 3 - CBC w/Diff/Platelet - Fe+TIBC+Fer - Comprehensive Metabolic Panel (CMET) - HgB A1c - Flu vaccine HIGH DOSE PF(Fluzone Trivalent)   Return in about 6 months (around 09/05/2024).   Sharyle Fischer, DO

## 2024-03-08 ENCOUNTER — Ambulatory Visit: Payer: Self-pay | Admitting: Internal Medicine

## 2024-03-08 LAB — CBC WITH DIFFERENTIAL/PLATELET
Absolute Lymphocytes: 933 {cells}/uL (ref 850–3900)
Absolute Monocytes: 433 {cells}/uL (ref 200–950)
Basophils Absolute: 43 {cells}/uL (ref 0–200)
Basophils Relative: 0.7 %
Eosinophils Absolute: 342 {cells}/uL (ref 15–500)
Eosinophils Relative: 5.6 %
HCT: 38.2 % — ABNORMAL LOW (ref 38.5–50.0)
Hemoglobin: 12.2 g/dL — ABNORMAL LOW (ref 13.2–17.1)
MCH: 24.6 pg — ABNORMAL LOW (ref 27.0–33.0)
MCHC: 31.9 g/dL — ABNORMAL LOW (ref 32.0–36.0)
MCV: 77.2 fL — ABNORMAL LOW (ref 80.0–100.0)
MPV: 10.2 fL (ref 7.5–12.5)
Monocytes Relative: 7.1 %
Neutro Abs: 4349 {cells}/uL (ref 1500–7800)
Neutrophils Relative %: 71.3 %
Platelets: 195 Thousand/uL (ref 140–400)
RBC: 4.95 Million/uL (ref 4.20–5.80)
RDW: 13.3 % (ref 11.0–15.0)
Total Lymphocyte: 15.3 %
WBC: 6.1 Thousand/uL (ref 3.8–10.8)

## 2024-03-08 LAB — COMPREHENSIVE METABOLIC PANEL WITH GFR
AG Ratio: 2 (calc) (ref 1.0–2.5)
ALT: 14 U/L (ref 9–46)
AST: 16 U/L (ref 10–35)
Albumin: 4.2 g/dL (ref 3.6–5.1)
Alkaline phosphatase (APISO): 70 U/L (ref 35–144)
BUN: 13 mg/dL (ref 7–25)
CO2: 29 mmol/L (ref 20–32)
Calcium: 9.1 mg/dL (ref 8.6–10.3)
Chloride: 108 mmol/L (ref 98–110)
Creat: 1 mg/dL (ref 0.70–1.28)
Globulin: 2.1 g/dL (ref 1.9–3.7)
Glucose, Bld: 112 mg/dL — ABNORMAL HIGH (ref 65–99)
Potassium: 4 mmol/L (ref 3.5–5.3)
Sodium: 144 mmol/L (ref 135–146)
Total Bilirubin: 0.5 mg/dL (ref 0.2–1.2)
Total Protein: 6.3 g/dL (ref 6.1–8.1)
eGFR: 81 mL/min/1.73m2 (ref >=60)

## 2024-03-08 LAB — IRON,TIBC AND FERRITIN PANEL
%SAT: 35 % (ref 20–48)
Ferritin: 70 ng/mL (ref 24–380)
Iron: 99 ug/dL (ref 50–180)
TIBC: 284 ug/dL (ref 250–425)

## 2024-03-08 LAB — LIPID PANEL
Cholesterol: 159 mg/dL (ref <200)
HDL: 71 mg/dL (ref >=40)
LDL Cholesterol (Calc): 66 mg/dL
Non-HDL Cholesterol (Calc): 88 mg/dL (ref <130)
Total CHOL/HDL Ratio: 2.2 (calc) (ref <5.0)
Triglycerides: 135 mg/dL (ref <150)

## 2024-03-08 LAB — HEMOGLOBIN A1C
Hgb A1c MFr Bld: 5.8 % — ABNORMAL HIGH (ref <5.7)
Mean Plasma Glucose: 120 mg/dL
eAG (mmol/L): 6.6 mmol/L

## 2024-03-27 ENCOUNTER — Encounter: Payer: Self-pay | Admitting: Internal Medicine

## 2024-03-27 ENCOUNTER — Other Ambulatory Visit: Payer: Self-pay

## 2024-03-27 ENCOUNTER — Ambulatory Visit

## 2024-03-27 ENCOUNTER — Ambulatory Visit (INDEPENDENT_AMBULATORY_CARE_PROVIDER_SITE_OTHER): Admitting: Internal Medicine

## 2024-03-27 ENCOUNTER — Other Ambulatory Visit (HOSPITAL_COMMUNITY)
Admission: RE | Admit: 2024-03-27 | Discharge: 2024-03-27 | Disposition: A | Discharge status: Home / Self Care | Source: Ambulatory Visit | Attending: Internal Medicine | Admitting: Internal Medicine

## 2024-03-27 VITALS — BP 130/76 | HR 72 | Temp 98.2°F | Resp 16 | Ht 73.0 in | Wt 214.1 lb

## 2024-03-27 DIAGNOSIS — K219 Gastro-esophageal reflux disease without esophagitis: Secondary | ICD-10-CM | POA: Diagnosis not present

## 2024-03-27 DIAGNOSIS — R1085 Abdominal pain of multiple sites: Secondary | ICD-10-CM | POA: Diagnosis not present

## 2024-03-27 DIAGNOSIS — R3129 Other microscopic hematuria: Secondary | ICD-10-CM

## 2024-03-27 DIAGNOSIS — M545 Low back pain, unspecified: Secondary | ICD-10-CM | POA: Insufficient documentation

## 2024-03-27 DIAGNOSIS — J309 Allergic rhinitis, unspecified: Secondary | ICD-10-CM

## 2024-03-27 DIAGNOSIS — Z636 Dependent relative needing care at home: Secondary | ICD-10-CM

## 2024-03-27 LAB — POCT URINALYSIS DIPSTICK
Bilirubin, UA: NEGATIVE
Glucose, UA: NEGATIVE
Leukocytes, UA: NEGATIVE
Nitrite, UA: NEGATIVE
Protein, UA: POSITIVE — AB
Spec Grav, UA: 1.02 (ref 1.010–1.025)
Urobilinogen, UA: 0.2 U/dL
pH, UA: 5 (ref 5.0–8.0)

## 2024-03-27 MED ORDER — HYDROXYZINE HCL 10 MG PO TABS: 10.0000 mg | ORAL_TABLET | Freq: Every evening | ORAL | 0 refills | Status: AC | PRN

## 2024-03-27 MED ORDER — FAMOTIDINE 20 MG PO TABS: 20.0000 mg | ORAL_TABLET | Freq: Every day | ORAL | 1 refills | Status: AC | PRN

## 2024-03-27 NOTE — Progress Notes (Signed)
 Acute Office Visit  Subjective:     Patient ID: William Hicks, male    DOB: [birth year], 73 y.o.   MRN: [id number]  Chief Complaint  Patient presents with   Abdominal Pain    Lower abdominal that radiates to back, feels like its burning for 2 weeks   Back Pain    Low back pain     HPI Patient is in today for abdominal pain.  Discussed the use of AI scribe software for clinical note transcription with the patient, who gave verbal consent to proceed.  History of Present Illness William Hicks is a 73 year old male who presents with nausea and abdominal pain.  He has experienced nausea and abdominal pain for one to two weeks. The pain is aching and burning, radiating downward, and sometimes feels like reflux, especially after consuming spicy foods. Mylanta provides some relief. He takes pantoprazole  daily for acid reflux but continues to have symptoms.  He is under significant stress due to his brother's serious illness and hospice care, affecting his sleep and emotional well-being. He stays up at night to care for his brother, leading to poor sleep quality and frequent crying.  Review of Systems  Gastrointestinal:  Positive for abdominal pain and heartburn. Negative for nausea and vomiting.  Psychiatric/Behavioral:  The patient is nervous/anxious and has insomnia.         Objective:    Pulse 72   Temp 98.2 F (36.8 C) (Oral)   Resp 16   Ht 6' 1 (1.854 m)   Wt 214 lb 1.6 oz (97.1 kg)   SpO2 100%   BMI 28.25 kg/m    Physical Exam Constitutional:      Appearance: Normal appearance.  HENT:     Head: Normocephalic and atraumatic.  Eyes:     Conjunctiva/sclera: Conjunctivae normal.  Cardiovascular:     Rate and Rhythm: Normal rate and regular rhythm.  Pulmonary:     Effort: Pulmonary effort is normal.     Breath sounds: Normal breath sounds.  Abdominal:     General: Bowel sounds are normal. There is no distension.     Palpations: Abdomen is soft.  There is no mass.     Tenderness: There is abdominal tenderness. There is no guarding or rebound.     Hernia: No hernia is present.     Comments: Epigastric and suprapubic pain to palpation   Skin:    General: Skin is warm and dry.  Neurological:     General: No focal deficit present.     Mental Status: He is alert. Mental status is at baseline.  Psychiatric:        Mood and Affect: Mood normal.        Behavior: Behavior normal.     No results found for any visits on 03/27/24.      Assessment & Plan:   Assessment & Plan Gastroesophageal reflux disease Chronic GERD with recent exacerbation. Symptoms persist despite pantoprazole . Possible stress-related exacerbation or ulcer. - Continue pantoprazole  daily. - Prescribed Pepcid as needed for breakthrough symptoms. - Advised second dose of pantoprazole  for severe symptoms. - Consider GI referral if symptoms worsen or persist. - UA here with microscopic hematuria which is chronic for him but no signs of infection, will add urine cytology due to suprapubic pain.   Insomnia and anxiety Likely stress-related due to caregiving and family illness. Hydroxyzine  considered for sedative and anxiolytic effects. - Prescribed hydroxyzine  as needed for anxiety and sleep, cautioning  drowsiness. - Advised taking hydroxyzine  at bedtime, avoid driving after initial use. - Offered dosage adjustment based on response.  - POCT urinalysis dipstick - Urine cytology ancillary only - famotidine (PEPCID) 20 MG tablet; Take 1 tablet (20 mg total) by mouth daily as needed for heartburn or indigestion.  Dispense: 90 tablet; Refill: 1 - hydrOXYzine  (ATARAX ) 10 MG tablet; Take 1 tablet (10 mg total) by mouth at bedtime as needed for anxiety.  Dispense: 30 tablet; Refill: 0 - Urine cytology ancillary only  Return for already scheduled.  William Fischer, DO

## 2024-03-28 LAB — URINE CYTOLOGY ANCILLARY ONLY
Chlamydia: NEGATIVE
Comment: NEGATIVE
Comment: NEGATIVE
Comment: NORMAL
Neisseria Gonorrhea: NEGATIVE
Trichomonas: NEGATIVE

## 2024-03-29 ENCOUNTER — Ambulatory Visit: Payer: Self-pay | Admitting: Internal Medicine

## 2024-04-23 ENCOUNTER — Ambulatory Visit

## 2024-04-23 DIAGNOSIS — J309 Allergic rhinitis, unspecified: Secondary | ICD-10-CM | POA: Diagnosis not present

## 2024-05-02 ENCOUNTER — Ambulatory Visit (INDEPENDENT_AMBULATORY_CARE_PROVIDER_SITE_OTHER): Admitting: Internal Medicine

## 2024-05-02 ENCOUNTER — Other Ambulatory Visit: Payer: Self-pay

## 2024-05-02 ENCOUNTER — Encounter: Payer: Self-pay | Admitting: Internal Medicine

## 2024-05-02 VITALS — BP 130/74 | HR 96 | Temp 98.8°F | Resp 18 | Ht 73.0 in | Wt 216.9 lb

## 2024-05-02 DIAGNOSIS — R051 Acute cough: Secondary | ICD-10-CM | POA: Diagnosis not present

## 2024-05-02 DIAGNOSIS — J069 Acute upper respiratory infection, unspecified: Secondary | ICD-10-CM | POA: Diagnosis not present

## 2024-05-02 LAB — POC COVID19/FLU A&B COMBO
Covid Antigen, POC: NEGATIVE
Influenza A Antigen, POC: NEGATIVE
Influenza B Antigen, POC: NEGATIVE

## 2024-05-02 MED ORDER — METHYLPREDNISOLONE 4 MG PO TBPK: ORAL_TABLET | ORAL | 0 refills | Status: AC

## 2024-05-02 NOTE — Progress Notes (Signed)
 Acute Office Visit  Subjective:     Patient ID: William Hicks, male    DOB: January 18, 1951, 73 y.o.   MRN: [id number]  Chief Complaint  Patient presents with   URI    Cough, congestion for 1 week    URI  Associated symptoms include congestion, coughing and sinus pain. Pertinent negatives include no ear pain, sore throat or wheezing.   Patient is in today for cough, congestion x 1 week.   Discussed the use of AI scribe software for clinical note transcription with the patient, who gave verbal consent to proceed.  History of Present Illness William Hicks is a 73 year old male who presents with thick mucus in the throat and sinus congestion.  He has had thick mucus in his throat for about a week, which he coughs up, with sinus congestion and forehead sinus pain. He notes mild shortness of breath when the mucus is thick, without wheezing or fever.  He is using over-the-counter Lomotussin with temporary relief, along with regular Fluticasone  and saline nasal sprays and a home humidifier.  He stopped Zyrtec and Claritin three days ago because he felt he was taking too much. Currently he is not taking any oral allergy  medication.   Review of Systems  Constitutional:  Negative for chills and fever.  HENT:  Positive for congestion and sinus pain. Negative for ear pain and sore throat.   Respiratory:  Positive for cough. Negative for sputum production, shortness of breath and wheezing.         Objective:    BP 130/74 (Cuff Size: Large)   Pulse 96   Temp 98.8 F (37.1 C) (Oral)   Resp 18   Ht 6' 1 (1.854 m)   Wt 216 lb 14.4 oz (98.4 kg)   SpO2 99%   BMI 28.62 kg/m  BP Readings from Last 3 Encounters:  05/02/24 130/74  03/27/24 130/76  03/07/24 134/82   Wt Readings from Last 3 Encounters:  05/02/24 216 lb 14.4 oz (98.4 kg)  03/27/24 214 lb 1.6 oz (97.1 kg)  03/07/24 213 lb 9.6 oz (96.9 kg)      Physical Exam Constitutional:      Appearance: Normal  appearance.  HENT:     Head: Normocephalic and atraumatic.     Right Ear: Tympanic membrane, ear canal and external ear normal.     Left Ear: Tympanic membrane, ear canal and external ear normal.     Nose: Congestion present.     Mouth/Throat:     Mouth: Mucous membranes are moist.     Pharynx: Oropharynx is clear.  Eyes:     Conjunctiva/sclera: Conjunctivae normal.  Cardiovascular:     Rate and Rhythm: Normal rate and regular rhythm.  Pulmonary:     Effort: Pulmonary effort is normal.     Breath sounds: Normal breath sounds. No wheezing, rhonchi or rales.  Skin:    General: Skin is warm and dry.  Neurological:     General: No focal deficit present.     Mental Status: He is alert.  Psychiatric:        Mood and Affect: Mood normal.        Behavior: Behavior normal.     Results for orders placed or performed in visit on 05/02/24  POC Covid19/Flu A&B Antigen  Result Value Ref Range   Influenza A Antigen, POC Negative Negative   Influenza B Antigen, POC Negative Negative   Covid Antigen, POC Negative Negative  Assessment & Plan:   Assessment & Plan Acute viral upper respiratory infection Symptoms consistent with viral etiology. No antibiotics indicated. Discussed bacterial superinfection risk if symptoms persist beyond two weeks. Steroids deferred due to symptom improvement. - Continue nasal saline spray and fluticasone  twice daily. - Consider Claritin or Zyrtec for mucus. - Use humidifier overnight. - Rest and hydrate. - Consider zinc and vitamin C. - Monitor symptoms. Contact if symptoms worsen, persist beyond two weeks, or new symptoms develop. - Sent steroid pack prescription; advised to wait before picking up unless symptoms worsen.  - POC Covid19/Flu A&B Antigen - methylPREDNISolone  (MEDROL  DOSEPAK) 4 MG TBPK tablet; Use as directed.  Dispense: 21 each; Refill: 0   Return if symptoms worsen or fail to improve.  Sharyle Fischer, DO

## 2024-05-23 ENCOUNTER — Ambulatory Visit

## 2024-05-23 DIAGNOSIS — J309 Allergic rhinitis, unspecified: Secondary | ICD-10-CM

## 2024-05-29 ENCOUNTER — Other Ambulatory Visit: Payer: Self-pay | Admitting: Internal Medicine

## 2024-05-29 DIAGNOSIS — K219 Gastro-esophageal reflux disease without esophagitis: Secondary | ICD-10-CM

## 2024-05-29 NOTE — Telephone Encounter (Unsigned)
 Copied from CRM #[id number]. Topic: Clinical - Medication Refill >> May 29, 2024  8:17 AM William Hicks wrote: Medication: pantoprazole  (PROTONIX ) 40 MG tablet [[id number]]  Has the patient contacted their pharmacy? No (Agent: If no, request that the patient contact the pharmacy for the refill. If patient does not wish to contact the pharmacy document the reason why and proceed with request.) (Agent: If yes, when and what did the pharmacy advise?)  This is the patient's preferred pharmacy:  [facility] STORE #[id number] [place] - [address] AT [facility] [address] [place] [address] Phone: [phone] Fax: [phone]  Is this the correct pharmacy for this prescription? Yes If no, delete pharmacy and type the correct one.   Has the prescription been filled recently? Yes  Is the patient out of the medication? Yes  Has the patient been seen for an appointment in the last year OR does the patient have an upcoming appointment? Yes  Can we respond through MyChart? No  Agent: Please be advised that Rx refills may take up to 3 business days. We ask that you follow-up with your pharmacy.

## 2024-05-30 MED ORDER — PANTOPRAZOLE SODIUM 40 MG PO TBEC: DELAYED_RELEASE_TABLET | ORAL | 1 refills | Status: AC

## 2024-05-30 NOTE — Telephone Encounter (Signed)
 Requested Prescriptions  Pending Prescriptions Disp Refills   pantoprazole  (PROTONIX ) 40 MG tablet 90 tablet 3    Sig: Take 40 mg once daily as needed for acid reflux.     Gastroenterology: Proton Pump Inhibitors Passed - 05/30/2024  3:09 PM      Passed - Valid encounter within last 12 months    Recent Outpatient Visits           4 weeks ago Viral upper respiratory tract infection   [facility] Bernardo Fend, DO   2 months ago Abdominal pain of multiple sites   [facility] Bernardo Fend, DO   2 months ago Mixed hyperlipidemia   [facility] Bernardo Fend, DO   9 months ago Numbness and tingling of both feet   [facility] Bernardo Fend, DO   10 months ago Numbness and tingling of both feet   [facility] Bernardo Fend, DO       Future Appointments             In 3 weeks William Marty Saltness, MD [facility]

## 2024-06-19 NOTE — Progress Notes (Signed)
 VIALS MADE ON 06/19/24

## 2024-06-22 ENCOUNTER — Telehealth: Payer: Self-pay

## 2024-06-22 NOTE — Progress Notes (Signed)
 Pharmacy Quality Measure Review  This patient is appearing on a report for being at risk of failing the adherence measure for cholesterol (statin) medications this calendar year.   Medication: atorvastatin  Last fill date: 05/28/24 for 90 day supply  Insurance report was not up to date. No action needed at this time.    Yancy Knoble E. Marsh, PharmD, BCACP, CPP Clinical Pharmacist [facility] [phone]

## 2024-06-26 ENCOUNTER — Ambulatory Visit: Admitting: Allergy & Immunology

## 2024-06-26 ENCOUNTER — Other Ambulatory Visit: Payer: Self-pay

## 2024-06-26 ENCOUNTER — Ambulatory Visit

## 2024-06-26 ENCOUNTER — Encounter: Payer: Self-pay | Admitting: Allergy & Immunology

## 2024-06-26 VITALS — BP 128/74 | HR 85 | Temp 98.0°F | Ht 71.5 in | Wt 219.2 lb

## 2024-06-26 DIAGNOSIS — J4520 Mild intermittent asthma, uncomplicated: Secondary | ICD-10-CM | POA: Diagnosis not present

## 2024-06-26 DIAGNOSIS — J302 Other seasonal allergic rhinitis: Secondary | ICD-10-CM

## 2024-06-26 DIAGNOSIS — K219 Gastro-esophageal reflux disease without esophagitis: Secondary | ICD-10-CM | POA: Diagnosis not present

## 2024-06-26 DIAGNOSIS — J3089 Other allergic rhinitis: Secondary | ICD-10-CM | POA: Diagnosis not present

## 2024-06-26 MED ORDER — HYDROCORTISONE 2.5 % EX CREA: TOPICAL_CREAM | Freq: Two times a day (BID) | CUTANEOUS | 0 refills | Status: AC

## 2024-06-26 NOTE — Addendum Note (Signed)
 Addended by: Koby Hartfield on: 06/26/2024 05:12 PM   Modules accepted: Orders

## 2024-07-27 ENCOUNTER — Ambulatory Visit

## 2024-09-05 ENCOUNTER — Ambulatory Visit: Admitting: Internal Medicine

## 2024-12-25 ENCOUNTER — Ambulatory Visit: Admitting: Allergy & Immunology
